# Patient Record
Sex: Female | Born: 1935 | Race: Black or African American | Hispanic: No | Smoking: Never smoker
Health system: Southern US, Community
[De-identification: ages and names within clinical notes are randomized; demographics above are authoritative.]

## PROBLEM LIST (undated history)

## (undated) DIAGNOSIS — R51 Headache: Secondary | ICD-10-CM

## (undated) DIAGNOSIS — M549 Dorsalgia, unspecified: Secondary | ICD-10-CM

## (undated) DIAGNOSIS — I639 Cerebral infarction, unspecified: Secondary | ICD-10-CM

## (undated) DIAGNOSIS — K922 Gastrointestinal hemorrhage, unspecified: Secondary | ICD-10-CM

## (undated) DIAGNOSIS — M1990 Unspecified osteoarthritis, unspecified site: Secondary | ICD-10-CM

## (undated) DIAGNOSIS — K5790 Diverticulosis of intestine, part unspecified, without perforation or abscess without bleeding: Secondary | ICD-10-CM

## (undated) DIAGNOSIS — R519 Headache, unspecified: Secondary | ICD-10-CM

## (undated) DIAGNOSIS — I10 Essential (primary) hypertension: Secondary | ICD-10-CM

## (undated) HISTORY — PX: ABDOMINAL HYSTERECTOMY: SHX81

## (undated) HISTORY — PX: CHOLECYSTECTOMY: SHX55

---

## 2001-07-03 ENCOUNTER — Emergency Department (HOSPITAL_COMMUNITY): Admission: EM | Admit: 2001-07-03 | Discharge: 2001-07-03 | Payer: Self-pay | Admitting: *Deleted

## 2001-08-28 ENCOUNTER — Emergency Department (HOSPITAL_COMMUNITY): Admission: EM | Admit: 2001-08-28 | Discharge: 2001-08-28 | Payer: Self-pay | Admitting: Emergency Medicine

## 2001-09-27 ENCOUNTER — Emergency Department (HOSPITAL_COMMUNITY): Admission: EM | Admit: 2001-09-27 | Discharge: 2001-09-27 | Payer: Self-pay | Admitting: Emergency Medicine

## 2001-09-27 ENCOUNTER — Encounter: Payer: Self-pay | Admitting: Emergency Medicine

## 2001-11-12 ENCOUNTER — Emergency Department (HOSPITAL_COMMUNITY): Admission: EM | Admit: 2001-11-12 | Discharge: 2001-11-12 | Payer: Self-pay | Admitting: *Deleted

## 2001-11-12 ENCOUNTER — Encounter: Payer: Self-pay | Admitting: *Deleted

## 2002-07-11 ENCOUNTER — Emergency Department (HOSPITAL_COMMUNITY): Admission: EM | Admit: 2002-07-11 | Discharge: 2002-07-11 | Payer: Self-pay | Admitting: Emergency Medicine

## 2002-07-11 ENCOUNTER — Encounter: Payer: Self-pay | Admitting: Emergency Medicine

## 2002-07-18 ENCOUNTER — Ambulatory Visit (HOSPITAL_COMMUNITY): Admission: RE | Admit: 2002-07-18 | Discharge: 2002-07-18 | Payer: Self-pay | Admitting: Emergency Medicine

## 2002-07-18 ENCOUNTER — Encounter: Payer: Self-pay | Admitting: Emergency Medicine

## 2002-11-13 ENCOUNTER — Emergency Department (HOSPITAL_COMMUNITY): Admission: EM | Admit: 2002-11-13 | Discharge: 2002-11-14 | Payer: Self-pay | Admitting: Emergency Medicine

## 2002-11-14 ENCOUNTER — Encounter: Payer: Self-pay | Admitting: Emergency Medicine

## 2003-01-19 ENCOUNTER — Emergency Department (HOSPITAL_COMMUNITY): Admission: EM | Admit: 2003-01-19 | Discharge: 2003-01-19 | Payer: Self-pay | Admitting: Emergency Medicine

## 2003-01-19 ENCOUNTER — Encounter: Payer: Self-pay | Admitting: Emergency Medicine

## 2003-06-04 ENCOUNTER — Emergency Department (HOSPITAL_COMMUNITY): Admission: EM | Admit: 2003-06-04 | Discharge: 2003-06-04 | Payer: Self-pay | Admitting: *Deleted

## 2006-09-16 ENCOUNTER — Emergency Department (HOSPITAL_COMMUNITY): Admission: EM | Admit: 2006-09-16 | Discharge: 2006-09-16 | Payer: Self-pay | Admitting: Emergency Medicine

## 2009-09-22 ENCOUNTER — Emergency Department (HOSPITAL_COMMUNITY): Admission: EM | Admit: 2009-09-22 | Discharge: 2009-09-22 | Payer: Self-pay | Admitting: Emergency Medicine

## 2010-07-14 ENCOUNTER — Emergency Department (HOSPITAL_COMMUNITY): Admission: EM | Admit: 2010-07-14 | Discharge: 2010-07-15 | Payer: Self-pay | Admitting: Emergency Medicine

## 2011-02-12 LAB — URINALYSIS, ROUTINE W REFLEX MICROSCOPIC
Glucose, UA: NEGATIVE mg/dL
Hgb urine dipstick: NEGATIVE
Ketones, ur: NEGATIVE mg/dL
Protein, ur: NEGATIVE mg/dL
Urobilinogen, UA: 0.2 mg/dL (ref 0.0–1.0)

## 2011-02-12 LAB — CBC
HCT: 37.3 % (ref 36.0–46.0)
Hemoglobin: 12.5 g/dL (ref 12.0–15.0)
MCHC: 33.4 g/dL (ref 30.0–36.0)
WBC: 7 10*3/uL (ref 4.0–10.5)

## 2011-02-12 LAB — COMPREHENSIVE METABOLIC PANEL
ALT: 14 U/L (ref 0–35)
Albumin: 3.7 g/dL (ref 3.5–5.2)
Alkaline Phosphatase: 37 U/L — ABNORMAL LOW (ref 39–117)
BUN: 11 mg/dL (ref 6–23)
Chloride: 98 mEq/L (ref 96–112)
Glucose, Bld: 125 mg/dL — ABNORMAL HIGH (ref 70–99)
Potassium: 2.9 mEq/L — ABNORMAL LOW (ref 3.5–5.1)
Sodium: 134 mEq/L — ABNORMAL LOW (ref 135–145)
Total Bilirubin: 0.8 mg/dL (ref 0.3–1.2)
Total Protein: 7.5 g/dL (ref 6.0–8.3)

## 2011-02-12 LAB — DIFFERENTIAL
Basophils Absolute: 0.1 10*3/uL (ref 0.0–0.1)
Basophils Relative: 1 % (ref 0–1)
Lymphocytes Relative: 27 % (ref 12–46)
Monocytes Absolute: 0.7 10*3/uL (ref 0.1–1.0)
Monocytes Relative: 9 % (ref 3–12)
Neutro Abs: 4.3 10*3/uL (ref 1.7–7.7)
Neutrophils Relative %: 61 % (ref 43–77)

## 2012-02-11 ENCOUNTER — Emergency Department (HOSPITAL_COMMUNITY)
Admission: EM | Admit: 2012-02-11 | Discharge: 2012-02-11 | Disposition: A | Discharge status: Home / Self Care | Payer: Medicare Other | Attending: Emergency Medicine | Admitting: Emergency Medicine

## 2012-02-11 ENCOUNTER — Encounter (HOSPITAL_COMMUNITY): Payer: Self-pay | Admitting: *Deleted

## 2012-02-11 ENCOUNTER — Emergency Department (HOSPITAL_COMMUNITY): Payer: Medicare Other

## 2012-02-11 DIAGNOSIS — M549 Dorsalgia, unspecified: Secondary | ICD-10-CM

## 2012-02-11 DIAGNOSIS — M545 Low back pain, unspecified: Secondary | ICD-10-CM | POA: Insufficient documentation

## 2012-02-11 DIAGNOSIS — R109 Unspecified abdominal pain: Secondary | ICD-10-CM | POA: Insufficient documentation

## 2012-02-11 DIAGNOSIS — M79609 Pain in unspecified limb: Secondary | ICD-10-CM | POA: Insufficient documentation

## 2012-02-11 HISTORY — DX: Unspecified osteoarthritis, unspecified site: M19.90

## 2012-02-11 HISTORY — DX: Essential (primary) hypertension: I10

## 2012-02-11 MED ORDER — OXYCODONE-ACETAMINOPHEN 5-325 MG PO TABS
1.0000 | ORAL_TABLET | Freq: Once | ORAL | Status: AC
  Administered 2012-02-11: 1 via ORAL
  Filled 2012-02-11: qty 1

## 2012-02-11 NOTE — ED Notes (Signed)
Pt alert & oriented x4, stable gait. Pt given discharge instructions, paperwork. Patient instructed to stop at the registration desk to finish any additional paperwork. pt verbalized understanding. Pt left department w/ no further questions.  

## 2012-02-11 NOTE — Discharge Instructions (Signed)
Take your ultram for pain.  Follow up with your md as needed

## 2012-02-11 NOTE — ED Notes (Signed)
Pain low back and both legs, "they hurt all the time ,but its getting worse".  No injury.  Alert,

## 2012-02-11 NOTE — ED Provider Notes (Signed)
History    This chart was scribed for Lynn Lennert, MD, MD by Smitty Pluck. The patient was seen in room APA03 and the patient's care was started at 5:10PM.   CSN: [id number]  Arrival date & time 02/11/12  1645   First MD Initiated Contact with Patient 02/11/12 1707      Chief Complaint  Patient presents with  . Back Pain    (Consider location/radiation/quality/duration/timing/severity/associated sxs/prior treatment) Patient is a 78 y.o. female presenting with back pain. The history is provided by the patient.  Back Pain  This is a recurrent problem. The problem occurs constantly. The problem has not changed since onset.The pain is associated with no known injury. The pain is present in the lumbar spine. The quality of the pain is described as aching. The pain does not radiate. The pain is moderate. The pain is the same all the time. Stiffness is present all day. She has tried nothing for the symptoms.   Lynn Nichols is a 78 y.o. female who presents to the Emergency Department complaining of moderate lower back and leg pain. Pt also reports mild abdominal pain. She denies vomiting, fever, cough and dysuria. She has had more frequent urination. She reports that she has arthritis. She reports that she has had this pain before awhile ago. The pain has been constant since onset without radiation. Pt denies injury.   Past Medical History  Diagnosis Date  . Arthritis   . Hypertension     Past Surgical History  Procedure Date  . Cholecystectomy   . Abdominal hysterectomy     History reviewed. No pertinent family history.  History  Substance Use Topics  . Smoking status: Never Smoker   . Smokeless tobacco: Current User    Types: Snuff  . Alcohol Use: No    OB History    Grav Para Term Preterm Abortions TAB SAB Ect Mult Living                  Review of Systems  Musculoskeletal: Positive for back pain.  All other systems reviewed and are negative.  10 Systems  reviewed and are negative for acute change except as noted in the HPI.   Allergies  Penicillins  Home Medications  No current outpatient prescriptions on file.  BP 151/60  Pulse 60  Temp(Src) 97.9 F (36.6 C) (Oral)  Resp 17  Ht 5\' 5"  (1.651 m)  Wt 143 lb (64.864 kg)  BMI 23.80 kg/m2  SpO2 96%  Physical Exam  Nursing note and vitals reviewed. Constitutional: She is oriented to person, place, and time. She appears well-developed.  HENT:  Head: Normocephalic and atraumatic.  Eyes: Conjunctivae and EOM are normal. No scleral icterus.  Neck: Neck supple. No thyromegaly present.  Cardiovascular: Normal rate and regular rhythm.  Exam reveals no gallop and no friction rub.   No murmur heard. Pulmonary/Chest: No stridor. She has no wheezes. She has no rales. She exhibits no tenderness.  Abdominal: She exhibits no distension. There is no tenderness. There is no rebound.  Musculoskeletal: Normal range of motion. She exhibits tenderness (lumbar spine). She exhibits no edema.  Lymphadenopathy:    She has no cervical adenopathy.  Neurological: She is oriented to person, place, and time. Coordination normal.  Skin: No rash noted. No erythema.  Psychiatric: She has a normal mood and affect. Her behavior is normal.    ED Course  Procedures (including critical care time)  DIAGNOSTIC STUDIES: Oxygen Saturation is 96% on  room air, normal by my interpretation.    COORDINATION OF CARE: 5:15PM EDP discusses pt ED treatment course with pt and pt's family    Labs Reviewed - No data to display Dg Lumbar Spine Complete  02/11/2012  *RADIOLOGY REPORT*  Clinical Data: Lower back pain.  No injury.  LUMBAR SPINE - COMPLETE 4+ VIEW  Comparison: None.  Findings: Rightward scoliosis and severe diffuse degenerative disc disease and facet disease throughout the lumbar spine.  7 mm of anterolisthesis of L4 on L5 related to severe facet disease.  No fracture.  No acute subluxation.  SI joints are  symmetric and unremarkable.  Aorta is calcified but appears nonaneurysmal.  IMPRESSION: Rightward scoliosis.  Severe spondylosis.  No acute findings.  Original Report Authenticated By: Cyndie Chime, M.D.   Dg Pelvis 1-2 Views  02/11/2012  *RADIOLOGY REPORT*  Clinical Data: Pelvic pain.  PELVIS - 1-2 VIEW  Comparison: 07/15/2010  Findings: No evidence of pelvic fracture.  Mild degenerative changes are seen involving the pubic symphysis.  No focal lytic or sclerotic bone lesions are seen involve the pelvis.  Advanced degenerative changes are seen in the lower lumbar spine.  IMPRESSION:  1.  No acute findings. 2.  Mild pubic symphysis degenerative changes. 3.  Advanced lower lumbar spondylosis.  Original Report Authenticated By: Danae Orleans, M.D.     No diagnosis found. The pt has not been taking her ultram.  She improved with percocet  MDM     Lumbar pain form arthitis    Lynn Lennert, MD 02/11/12 1845

## 2012-02-22 ENCOUNTER — Emergency Department (HOSPITAL_COMMUNITY)
Admission: EM | Admit: 2012-02-22 | Discharge: 2012-02-22 | Disposition: A | Discharge status: Home / Self Care | Payer: Medicare Other | Attending: Emergency Medicine | Admitting: Emergency Medicine

## 2012-02-22 ENCOUNTER — Emergency Department (HOSPITAL_COMMUNITY): Payer: Medicare Other

## 2012-02-22 ENCOUNTER — Encounter (HOSPITAL_COMMUNITY): Payer: Self-pay | Admitting: *Deleted

## 2012-02-22 DIAGNOSIS — M549 Dorsalgia, unspecified: Secondary | ICD-10-CM

## 2012-02-22 DIAGNOSIS — M129 Arthropathy, unspecified: Secondary | ICD-10-CM | POA: Insufficient documentation

## 2012-02-22 DIAGNOSIS — G8929 Other chronic pain: Secondary | ICD-10-CM | POA: Insufficient documentation

## 2012-02-22 DIAGNOSIS — M25551 Pain in right hip: Secondary | ICD-10-CM

## 2012-02-22 DIAGNOSIS — Z79899 Other long term (current) drug therapy: Secondary | ICD-10-CM | POA: Insufficient documentation

## 2012-02-22 DIAGNOSIS — M25559 Pain in unspecified hip: Secondary | ICD-10-CM | POA: Insufficient documentation

## 2012-02-22 DIAGNOSIS — M545 Low back pain, unspecified: Secondary | ICD-10-CM | POA: Insufficient documentation

## 2012-02-22 DIAGNOSIS — I10 Essential (primary) hypertension: Secondary | ICD-10-CM | POA: Insufficient documentation

## 2012-02-22 DIAGNOSIS — R339 Retention of urine, unspecified: Secondary | ICD-10-CM | POA: Insufficient documentation

## 2012-02-22 DIAGNOSIS — M533 Sacrococcygeal disorders, not elsewhere classified: Secondary | ICD-10-CM | POA: Insufficient documentation

## 2012-02-22 HISTORY — DX: Dorsalgia, unspecified: M54.9

## 2012-02-22 LAB — URINALYSIS, ROUTINE W REFLEX MICROSCOPIC
Bilirubin Urine: NEGATIVE
Glucose, UA: NEGATIVE mg/dL
Hgb urine dipstick: NEGATIVE
Ketones, ur: 15 mg/dL — AB
Protein, ur: 100 mg/dL — AB
pH: 7 (ref 5.0–8.0)

## 2012-02-22 LAB — URINE MICROSCOPIC-ADD ON

## 2012-02-22 MED ORDER — HYDROCODONE-ACETAMINOPHEN 5-325 MG PO TABS
ORAL_TABLET | ORAL | Status: AC
  Administered 2012-02-22: 1
  Filled 2012-02-22: qty 1

## 2012-02-22 MED ORDER — TRAMADOL HCL 50 MG PO TABS: 50.0000 mg | ORAL_TABLET | Freq: Three times a day (TID) | ORAL | Status: DC | PRN

## 2012-02-22 NOTE — ED Notes (Addendum)
Pt brought to er by family due to right hip pain, pt recently seen in er for same complaint, advised to follow up with pcp. Pt and family states that pt is not getting any better. Cms intact distal to right hip. Pt also reports that she has been having problems with urinating since having problems with her hip

## 2012-02-22 NOTE — ED Notes (Signed)
Explained procedure again and told her family can go with her. Pt agreed to have ct done. edp aware.

## 2012-02-22 NOTE — ED Provider Notes (Signed)
History    This chart was scribed for Laray Anger, DO, MD by Smitty Pluck. The patient was seen in room APA05 and the patient's care was started at 12:26PM.   CSN: [id number]  Arrival date & time 02/22/12  1115   First MD Initiated Contact with Patient 02/22/12 1259      Chief Complaint  Patient presents with  . Hip Pain  . Urinary Retention    HPI Lynn Nichols is a 78 y.o. female who presents to the Emergency Department complaining of gradual onset and persistence of constant right hip "pain" onset 2 weeks ago. Pt reports having pain in her right side low back that started before the hip pain. Pain began after she "lifting something." Symptoms have been constant since onset without radiation.  Family states pt "might" be having "decreased" urination. Denies dysuria/hematuria, no abd pain, no N/V/D, no tingling/numbness in extremities, no focal motor weakness, no saddle anesthesia, no incont of bowel or bladder, no falls/injury.    Past Medical History  Diagnosis Date  . Arthritis   . Hypertension   . Back pain     Past Surgical History  Procedure Date  . Cholecystectomy   . Abdominal hysterectomy     History  Substance Use Topics  . Smoking status: Never Smoker   . Smokeless tobacco: Current User    Types: Snuff  . Alcohol Use: No   Review of Systems ROS: Statement: All systems negative except as marked or noted in the HPI; Constitutional: Negative for fever and chills. ; ; Eyes: Negative for eye pain, redness and discharge. ; ; ENMT: Negative for ear pain, hoarseness, nasal congestion, sinus pressure and sore throat. ; ; Cardiovascular: Negative for chest pain, palpitations, diaphoresis, dyspnea and peripheral edema. ; ; Respiratory: Negative for cough, wheezing and stridor. ; ; Gastrointestinal: Negative for nausea, vomiting, diarrhea, abdominal pain, blood in stool, hematemesis, jaundice and rectal bleeding. . ; ; Genitourinary: Negative for dysuria, flank pain  and hematuria. ; ; Musculoskeletal: +back pain, right hip pain.  Denies neck pain. Negative for swelling and trauma.; ; Skin: Negative for pruritus, rash, abrasions, blisters, bruising and skin lesion.; ; Neuro: Negative for headache, lightheadedness and neck stiffness. Negative for weakness, altered level of consciousness , altered mental status, extremity weakness, paresthesias, involuntary movement, seizure and syncope.        Allergies  Penicillins  Home Medications   Current Outpatient Rx  Name Route Sig Dispense Refill  . ACETAMINOPHEN 500 MG PO TABS Oral Take 500 mg by mouth every 6 (six) hours as needed. For pain    . ALBUTEROL SULFATE HFA 108 (90 BASE) MCG/ACT IN AERS Inhalation Inhale 2 puffs into the lungs every 6 (six) hours as needed. For shortness of breath    . ATENOLOL 50 MG PO TABS Oral Take 50 mg by mouth every morning.    Marland Kitchen HYDROCHLOROTHIAZIDE 25 MG PO TABS Oral Take 25 mg by mouth every morning.    Marland Kitchen LEVOBUNOLOL HCL 0.5 % OP SOLN Both Eyes Place 1 drop into both eyes 2 (two) times daily.    . MELOXICAM 7.5 MG PO TABS Oral Take 7.5-15 mg by mouth daily.    Marland Kitchen NIFEDIPINE ER OSMOTIC 90 MG PO TB24 Oral Take 90 mg by mouth every morning.    Marland Kitchen TRAMADOL HCL 50 MG PO TABS Oral Take 1 tablet (50 mg total) by mouth every 8 (eight) hours as needed for pain. 9 tablet 0    BP  143/54  Pulse 51  Temp(Src) 98.1 F (36.7 C) (Oral)  Resp 16  Ht 5\' 5"  (1.651 m)  SpO2 97%  Physical Exam 1330: Physical examination:  Nursing notes reviewed; Vital signs and O2 SAT reviewed;  Constitutional: Well developed, Well nourished, Well hydrated, In no acute distress; Head:  Normocephalic, atraumatic; Eyes: EOMI, PERRL, No scleral icterus; ENMT: Mouth and pharynx normal, Mucous membranes moist; Neck: Supple, Full range of motion, No lymphadenopathy; Cardiovascular: Regular rate and rhythm, No murmur, rub, or gallop; Respiratory: Breath sounds clear & equal bilaterally, No rales, rhonchi, wheezes,  or rub, Normal respiratory effort/excursion; Chest: Nontender, Movement normal; Abdomen: Soft, Nontender, Nondistended, Normal bowel sounds; Genitourinary: No CVA tenderness; Spine:  No midline CS, TS, LS tenderness, +TTP right lower lumbar paraspinal muscles and SI joint; Extremities: Pulses normal, pelvis stable, +mild right hip TTP. No palpable right inguinal masses.  No deformity, No edema, No calf edema or asymmetry.; Neuro: AA&Ox3, Major CN grossly intact.  No gross focal motor or sensory deficits in extremities.; Skin: Color normal, Warm, Dry, no rash.    ED Course  Procedures   MDM  MDM Reviewed: nursing note, vitals and previous chart Reviewed previous: x-ray Interpretation: CT scan and labs   Results for orders placed during the hospital encounter of 02/22/12  URINALYSIS, ROUTINE W REFLEX MICROSCOPIC      Component Value Range   Color, Urine YELLOW  YELLOW    APPearance CLEAR  CLEAR    Specific Gravity, Urine 1.015  1.005 - 1.030    pH 7.0  5.0 - 8.0    Glucose, UA NEGATIVE  NEGATIVE (mg/dL)   Hgb urine dipstick NEGATIVE  NEGATIVE    Bilirubin Urine NEGATIVE  NEGATIVE    Ketones, ur 15 (*) NEGATIVE (mg/dL)   Protein, ur 960 (*) NEGATIVE (mg/dL)   Urobilinogen, UA 2.0 (*) 0.0 - 1.0 (mg/dL)   Nitrite NEGATIVE  NEGATIVE    Leukocytes, UA NEGATIVE  NEGATIVE   URINE MICROSCOPIC-ADD ON      Component Value Range   Squamous Epithelial / LPF MANY (*) RARE    WBC, UA 0-2  <3 (WBC/hpf)   RBC / HPF 3-6  <3 (RBC/hpf)   Bacteria, UA RARE  RARE    Ct Abdomen Pelvis Wo Contrast 02/22/2012  *RADIOLOGY REPORT*  Clinical Data: Hip pain.  Urinary retention  CT ABDOMEN AND PELVIS WITHOUT CONTRAST  Technique:  Multidetector CT imaging of the abdomen and pelvis was performed following the standard protocol without intravenous contrast.  Comparison: None  Findings:  No pericardial or pleural effusion.  Lung bases appear clear.  The spleen appears normal.  The adrenal glands are both normal.   The pancreas appears normal.  Normal appearance of both adrenal glands.  The liver parenchyma appears within normal limits.  The gallbladder is not visualized compatible with prior cholecystectomy.  No significant biliary dilatation.  The pancreas appears normal.  Bilateral renal cysts are identified.  These are incompletely characterized without IV contrast.  Tiny stone within the inferior pole of the right kidney measures 2- 3 mm, image 34.  There is no right hydronephrosis or, hydroureter. No right-sided ureterolithiasis.  There is no left renal calculus. No left-sided hydroureter or ureterolithiasis.  There is calcified atherosclerotic disease affecting the abdominal aorta and its branches.  Urinary bladder appears normal.  The stomach and the small bowel loops are unremarkable.  Multiple left-sided diverticula noted without acute inflammation.  Review of the visualized osseous structures is significant for scoliosis  and spondylosis.  Most severe at the L5-S1 level.  There is a first-degree anterolisthesis of L4 on L5 noted.  IMPRESSION:  1.  No acute findings within the abdomen or pelvis. 2.  Punctate nonobstructing right renal calculus. 3.  Bilateral renal cysts are incompletely characterized without IV contrast.  Original Report Authenticated By: Rosealee Albee, M.D.   Dg Lumbar Spine Complete 02/11/2012  *RADIOLOGY REPORT*  Clinical Data: Lower back pain.  No injury.  LUMBAR SPINE - COMPLETE 4+ VIEW  Comparison: None.  Findings: Rightward scoliosis and severe diffuse degenerative disc disease and facet disease throughout the lumbar spine.  7 mm of anterolisthesis of L4 on L5 related to severe facet disease.  No fracture.  No acute subluxation.  SI joints are symmetric and unremarkable.  Aorta is calcified but appears nonaneurysmal.  IMPRESSION: Rightward scoliosis.  Severe spondylosis.  No acute findings.  Original Report Authenticated By: Cyndie Chime, M.D.   Dg Pelvis 1-2 Views 02/11/2012   *RADIOLOGY REPORT*  Clinical Data: Pelvic pain.  PELVIS - 1-2 VIEW  Comparison: 07/15/2010  Findings: No evidence of pelvic fracture.  Mild degenerative changes are seen involving the pubic symphysis.  No focal lytic or sclerotic bone lesions are seen involve the pelvis.  Advanced degenerative changes are seen in the lower lumbar spine.  IMPRESSION:  1.  No acute findings. 2.  Mild pubic symphysis degenerative changes. 3.  Advanced lower lumbar spondylosis.  Original Report Authenticated By: Danae Orleans, M.D.    5:25 PM:  Pt is not incont of urine or bowel while in ED; family denies she is at home also.  UA without infection.  Pt wants to go home now, has a cane she walks with and she lives with family.  Family is requesting "something stronger than the tylenol."  I explained my concern of pt becoming groggy/confused on a narcotic pain medicine, but family states pt "really needs something stronger to sleep."  VS remain stable while in ED, neuro exam unchanged.  Family wants to take pt home now.  Dx testing d/w pt and family.  Questions answered.  Verb understanding, agreeable to d/c home with outpt f/u.      I personally performed the services described in this documentation, which was scribed in my presence. The recorded information has been reviewed and considered. Sherryann Frese Allison Quarry, DO 02/24/12 2009

## 2012-02-22 NOTE — Discharge Instructions (Signed)
RESOURCE GUIDE  Dental Problems  Patients with Medicaid: [facility]                     [facility] [address]                                           [address] Phone:  [phone]                                                  Phone:  [phone]  If unable to pay or uninsured, contact:  [facility] or [facility] to become qualified for the adult dental clinic.  Chronic Pain Problems Contact [facility]  [phone] Patients need to be referred by their primary care doctor.  Insufficient Money for Medicine Contact [employer]:  call "211" or [facility] 271-5999.  No Primary Care Doctor Call [facility]  832-8000 Other agencies that provide inexpensive medical care    [facility]  [phone]    [facility]  [phone]    [facility]  [phone]    Women's Clinic  [phone]    [facility]  [phone]    [facility]  [phone]  Psychological Services [facility]  [phone] [facility]  [phone] [facility]   800 853-5163 (emergency services 641-4993)  Substance Abuse Resources [facility]  336-882-2125 [facility] 336-784-9470 [facility] 336-285-9073 [facility] 336-845-3988 Residential & Outpatient Substance Abuse Program  800-659-3381  Abuse/Neglect [place] Child Abuse Hotline (336) 641-3795 [place] Child Abuse Hotline 800-378-5315 (After Hours)  Emergency Shelter [facility] (336) 271-5985  Maternity Homes [facility] (336) 275-9566 [facility] (704) 372-4663  MRSA Hotline #:   832-7006    [place] Resources  [facility] [place]     [employer]                          [facility] [address] [place]                       [address]      [address]  [place]                                                [place]                            [place] Phone:  [phone]                                   Phone:  [phone]                 Phone:  [phone]  [facility] Phone:  [phone]    [facility] Child Abuse Hotline 802-192-2926 667-218-3937 (After Hours)    Take the prescription as directed.  Apply moist heat or ice to the area(s) of discomfort, for 15 minutes at a time, several times per day for the next few days.  Do not fall asleep on a heating or ice pack.  Call your regular medical doctor tomorrow to schedule a follow up appointment this week.  Return to the Emergency Department immediately if worsening.

## 2012-02-23 LAB — URINE CULTURE
Colony Count: NO GROWTH
Culture  Setup Time: 201303252210
Culture: NO GROWTH

## 2012-03-01 ENCOUNTER — Encounter (HOSPITAL_COMMUNITY): Payer: Self-pay | Admitting: *Deleted

## 2012-03-01 ENCOUNTER — Other Ambulatory Visit: Payer: Self-pay

## 2012-03-01 ENCOUNTER — Emergency Department (HOSPITAL_COMMUNITY): Payer: Medicare Other

## 2012-03-01 ENCOUNTER — Emergency Department (HOSPITAL_COMMUNITY)
Admission: EM | Admit: 2012-03-01 | Discharge: 2012-03-01 | Disposition: A | Discharge status: Home / Self Care | Payer: Medicare Other | Attending: Emergency Medicine | Admitting: Emergency Medicine

## 2012-03-01 DIAGNOSIS — R110 Nausea: Secondary | ICD-10-CM | POA: Insufficient documentation

## 2012-03-01 DIAGNOSIS — M1610 Unilateral primary osteoarthritis, unspecified hip: Secondary | ICD-10-CM | POA: Insufficient documentation

## 2012-03-01 DIAGNOSIS — R109 Unspecified abdominal pain: Secondary | ICD-10-CM | POA: Insufficient documentation

## 2012-03-01 DIAGNOSIS — M169 Osteoarthritis of hip, unspecified: Secondary | ICD-10-CM | POA: Insufficient documentation

## 2012-03-01 LAB — CBC
HCT: 41.3 % (ref 36.0–46.0)
MCH: 29.9 pg (ref 26.0–34.0)
MCHC: 35.4 g/dL (ref 30.0–36.0)
MCV: 84.5 fL (ref 78.0–100.0)
RDW: 13.6 % (ref 11.5–15.5)
WBC: 8.9 10*3/uL (ref 4.0–10.5)

## 2012-03-01 LAB — DIFFERENTIAL
Basophils Absolute: 0 10*3/uL (ref 0.0–0.1)
Eosinophils Absolute: 0 10*3/uL (ref 0.0–0.7)
Eosinophils Relative: 0 % (ref 0–5)
Lymphocytes Relative: 21 % (ref 12–46)
Monocytes Absolute: 0.6 10*3/uL (ref 0.1–1.0)

## 2012-03-01 LAB — BASIC METABOLIC PANEL
CO2: 28 mEq/L (ref 19–32)
Calcium: 9.5 mg/dL (ref 8.4–10.5)
Creatinine, Ser: 0.63 mg/dL (ref 0.50–1.10)
GFR calc non Af Amer: 74 mL/min — ABNORMAL LOW (ref >90)
Glucose, Bld: 130 mg/dL — ABNORMAL HIGH (ref 70–99)

## 2012-03-01 MED ORDER — POLYETHYLENE GLYCOL 3350 17 G PO PACK: 17.0000 g | PACK | Freq: Every day | ORAL | Status: AC

## 2012-03-01 MED ORDER — IBUPROFEN 600 MG PO TABS: 600.0000 mg | ORAL_TABLET | Freq: Four times a day (QID) | ORAL | Status: AC | PRN

## 2012-03-01 NOTE — Discharge Instructions (Signed)
Degenerative Arthritis  You have osteoarthritis. This is the wear and tear arthritis that comes with aging. It is also called degenerative arthritis. This is common in people past middle age. It is caused by stress on the joints. The large weight bearing joints of the lower extremities are most often affected. The knees, hips, back, neck, and hands can become painful, swollen, and stiff. This is the most common type of arthritis. It comes on with age, carrying too much weight, or from an injury.  Treatment includes resting the sore joint until the pain and swelling improve. Crutches or a walker may be needed for severe flares. Only take over-the-counter or prescription medicines for pain, discomfort, or fever as directed by your caregiver. Local heat therapy may improve motion. Cortisone shots into the joint are sometimes used to reduce pain and swelling during flares.  Osteoarthritis is usually not crippling and progresses slowly. There are things you can do to decrease pain:  · Avoid high impact activities.  · Exercise regularly.  · Low impact exercises such as walking, biking and swimming help to keep the muscles strong and keep normal joint function.  · Stretching helps to keep your range of motion.  · Lose weight if you are overweight. This reduces joint stress.  In severe cases when you have pain at rest or increasing disability, joint surgery may be helpful. See your caregiver for follow-up treatment as recommended.   SEEK IMMEDIATE MEDICAL CARE IF:   · You have severe joint pain.  · Marked swelling and redness in your joint develops.  · You develop a high fever.  Document Released: 11/16/2005 Document Revised: 11/05/2011 Document Reviewed: 04/18/2007  [employer]® Patient Information ©2012 [employer].

## 2012-03-01 NOTE — ED Notes (Signed)
Pt reports generalized pain to arms, legs, and abdomen x 2 weeks. When asked what hurts the most pt points to right lower quadrant. Reports nausea no vomiting. Reports mild constipation. Pt denies specific chest pain, ekg completed however due to abd discomfort and generalized complaints.

## 2012-03-01 NOTE — ED Provider Notes (Addendum)
History     CSN: [id number]  Arrival date & time 03/01/12  1434   First MD Initiated Contact with Patient 03/01/12 1521      Chief Complaint  Patient presents with  . Abdominal Pain  . Nausea     HPI Pt reports generalized pain to arms, legs, and abdomen x 2 weeks. When asked what hurts the most pt points to right lower quadrant. Reports nausea no vomiting. Reports mild constipation. Pt denies specific chest pain, ekg completed however due to abd discomfort and generalized complaints.   Past Medical History  Diagnosis Date  . Arthritis   . Hypertension   . Back pain     Past Surgical History  Procedure Date  . Cholecystectomy   . Abdominal hysterectomy     History reviewed. No pertinent family history.  History  Substance Use Topics  . Smoking status: Never Smoker   . Smokeless tobacco: Current User    Types: Snuff  . Alcohol Use: No    OB History    Grav Para Term Preterm Abortions TAB SAB Ect Mult Living                  Review of Systems  Unable to perform ROS   Allergies  Penicillins  Home Medications   Current Outpatient Rx  Name Route Sig Dispense Refill  . ACETAMINOPHEN 500 MG PO TABS Oral Take 500 mg by mouth every 6 (six) hours as needed. For pain    . ALBUTEROL SULFATE HFA 108 (90 BASE) MCG/ACT IN AERS Inhalation Inhale 2 puffs into the lungs every 6 (six) hours as needed. For shortness of breath    . ATENOLOL 50 MG PO TABS Oral Take 50 mg by mouth every morning.    Marland Kitchen HYDROCHLOROTHIAZIDE 25 MG PO TABS Oral Take 25 mg by mouth every morning.    Marland Kitchen LEVOBUNOLOL HCL 0.5 % OP SOLN Both Eyes Place 1 drop into both eyes 2 (two) times daily.    Marland Kitchen NIFEDIPINE ER OSMOTIC 90 MG PO TB24 Oral Take 90 mg by mouth every morning.    Marland Kitchen TRAMADOL HCL 50 MG PO TABS Oral Take 50 mg by mouth every 8 (eight) hours as needed. For pain    . IBUPROFEN 600 MG PO TABS Oral Take 1 tablet (600 mg total) by mouth every 6 (six) hours as needed for pain. 30 tablet 0    BP  139/74  Pulse 54  Temp(Src) 97 F (36.1 C) (Oral)  Resp 16  SpO2 97%  Physical Exam  Nursing note and vitals reviewed. Constitutional: She is oriented to person, place, and time. She appears well-developed and well-nourished. No distress.  HENT:  Head: Normocephalic and atraumatic.  Eyes: Pupils are equal, round, and reactive to light.  Neck: Normal range of motion.  Cardiovascular: Normal rate and intact distal pulses.   Pulmonary/Chest: No respiratory distress.  Abdominal: Normal appearance. She exhibits no distension.       Examination of inguinal canals reveals no hernia examination of umbilicus abdominal wall reveals no hernia  Musculoskeletal: Normal range of motion.       Right hip: She exhibits tenderness.       Legs: Neurological: She is alert and oriented to person, place, and time. No cranial nerve deficit.  Skin: Skin is warm and dry. No rash noted.  Psychiatric: She has a normal mood and affect. Her behavior is normal.    ED Course  Procedures (including critical care time)  Labs  Reviewed  BASIC METABOLIC PANEL - Abnormal; Notable for the following:    Sodium 134 (*)    Potassium 3.2 (*)    Chloride 92 (*)    Glucose, Bld 130 (*)    GFR calc non Af Amer 74 (*)    GFR calc Af Amer 86 (*)    All other components within normal limits  CBC  DIFFERENTIAL   Dg Abd Acute W/chest  03/01/2012  *RADIOLOGY REPORT*  Clinical Data: Nausea, decreased bowel movements  ACUTE ABDOMEN SERIES (ABDOMEN 2 VIEW & CHEST 1 VIEW)  Comparison: CT scan 02/22/2012  Findings: Cardiomediastinal silhouette is unremarkable.  No acute infiltrate or pleural effusion.  No pulmonary edema.  Mild elevation of the left hemidiaphragm.  Mild levoscoliosis lumbar spine.  There is nonspecific nonobstructive bowel gas pattern. Moderate stool noted in the right colon.  Pelvic phleboliths are noted.  Degenerative changes lumbar spine.  No free abdominal air.  IMPRESSION: No acute disease.  Mild elevation of  the left hemidiaphragm. Degenerative changes and mild levoscoliosis lumbar spine. Nonspecific nonobstructive bowel gas pattern.  Moderate colonic stool right colon.  Original Report Authenticated By: Natasha Mead, M.D.     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

## 2012-11-08 ENCOUNTER — Emergency Department (HOSPITAL_COMMUNITY): Payer: Medicare Other

## 2012-11-08 ENCOUNTER — Encounter (HOSPITAL_COMMUNITY): Payer: Self-pay | Admitting: *Deleted

## 2012-11-08 ENCOUNTER — Emergency Department (HOSPITAL_COMMUNITY)
Admission: EM | Admit: 2012-11-08 | Discharge: 2012-11-08 | Disposition: A | Discharge status: Home / Self Care | Payer: Medicare Other | Attending: Emergency Medicine | Admitting: Emergency Medicine

## 2012-11-08 DIAGNOSIS — M545 Low back pain, unspecified: Secondary | ICD-10-CM | POA: Insufficient documentation

## 2012-11-08 DIAGNOSIS — E876 Hypokalemia: Secondary | ICD-10-CM

## 2012-11-08 DIAGNOSIS — Z79899 Other long term (current) drug therapy: Secondary | ICD-10-CM | POA: Insufficient documentation

## 2012-11-08 DIAGNOSIS — M549 Dorsalgia, unspecified: Secondary | ICD-10-CM

## 2012-11-08 DIAGNOSIS — Z9089 Acquired absence of other organs: Secondary | ICD-10-CM | POA: Insufficient documentation

## 2012-11-08 DIAGNOSIS — I10 Essential (primary) hypertension: Secondary | ICD-10-CM | POA: Insufficient documentation

## 2012-11-08 DIAGNOSIS — Z90710 Acquired absence of both cervix and uterus: Secondary | ICD-10-CM | POA: Insufficient documentation

## 2012-11-08 DIAGNOSIS — R109 Unspecified abdominal pain: Secondary | ICD-10-CM | POA: Insufficient documentation

## 2012-11-08 LAB — LIPASE, BLOOD: Lipase: 12 U/L (ref 11–59)

## 2012-11-08 LAB — COMPREHENSIVE METABOLIC PANEL
AST: 26 U/L (ref 0–37)
CO2: 31 mEq/L (ref 19–32)
Calcium: 10.3 mg/dL (ref 8.4–10.5)
Creatinine, Ser: 0.83 mg/dL (ref 0.50–1.10)
GFR calc Af Amer: 67 mL/min — ABNORMAL LOW (ref >90)
GFR calc non Af Amer: 58 mL/min — ABNORMAL LOW (ref >90)
Glucose, Bld: 140 mg/dL — ABNORMAL HIGH (ref 70–99)

## 2012-11-08 LAB — CBC WITH DIFFERENTIAL/PLATELET
Basophils Absolute: 0 10*3/uL (ref 0.0–0.1)
Eosinophils Relative: 1 % (ref 0–5)
HCT: 41.9 % (ref 36.0–46.0)
Lymphocytes Relative: 26 % (ref 12–46)
MCV: 86.7 fL (ref 78.0–100.0)
Monocytes Absolute: 0.5 10*3/uL (ref 0.1–1.0)
RDW: 13.9 % (ref 11.5–15.5)
WBC: 7.2 10*3/uL (ref 4.0–10.5)

## 2012-11-08 LAB — URINALYSIS, ROUTINE W REFLEX MICROSCOPIC
Leukocytes, UA: NEGATIVE
Nitrite: NEGATIVE
Urobilinogen, UA: 1 mg/dL (ref 0.0–1.0)

## 2012-11-08 LAB — URINE MICROSCOPIC-ADD ON

## 2012-11-08 MED ORDER — OXYCODONE-ACETAMINOPHEN 5-325 MG PO TABS: 1.0000 | ORAL_TABLET | Freq: Three times a day (TID) | ORAL | Status: DC | PRN

## 2012-11-08 MED ORDER — OXYCODONE-ACETAMINOPHEN 5-325 MG PO TABS
1.0000 | ORAL_TABLET | Freq: Once | ORAL | Status: AC
  Administered 2012-11-08: 1 via ORAL
  Filled 2012-11-08: qty 1

## 2012-11-08 MED ORDER — POTASSIUM CHLORIDE CRYS ER 20 MEQ PO TBCR
40.0000 meq | EXTENDED_RELEASE_TABLET | Freq: Once | ORAL | Status: AC
  Administered 2012-11-08: 40 meq via ORAL
  Filled 2012-11-08: qty 2

## 2012-11-08 NOTE — ED Provider Notes (Signed)
History     CSN: [id number]  Arrival date & time 11/08/12  1501   First MD Initiated Contact with Patient 11/08/12 1625      Chief Complaint  Patient presents with  . Back Pain  . Abdominal Pain     Patient is a 78 y.o. female presenting with back pain. The history is provided by the patient and a relative.  Back Pain  This is a recurrent problem. The current episode started more than 1 week ago. The problem occurs constantly. The problem has been gradually worsening. The pain is associated with no known injury. The pain is present in the lumbar spine. The pain does not radiate. The pain is moderate. The symptoms are aggravated by certain positions. Associated symptoms include abdominal pain and pelvic pain. Pertinent negatives include no chest pain, no fever, no bowel incontinence, no dysuria, no paresis and no weakness. Treatments tried: rest. The treatment provided mild relief.  pt reports low back pain and lower abd/pelvic pain for "awhile" No falls/trauma She is ambulatory No fever No weakness No dysuria No vomiting/diarrhea   Past Medical History  Diagnosis Date  . Arthritis   . Hypertension   . Back pain     Past Surgical History  Procedure Date  . Cholecystectomy   . Abdominal hysterectomy     No family history on file.  History  Substance Use Topics  . Smoking status: Never Smoker   . Smokeless tobacco: Current User    Types: Snuff  . Alcohol Use: No    OB History    Grav Para Term Preterm Abortions TAB SAB Ect Mult Living                  Review of Systems  Constitutional: Negative for fever.  Cardiovascular: Negative for chest pain.  Gastrointestinal: Positive for abdominal pain. Negative for vomiting, blood in stool and bowel incontinence.  Genitourinary: Positive for pelvic pain. Negative for dysuria.  Musculoskeletal: Positive for back pain.  Skin: Negative for color change.  Neurological: Negative for weakness.  Psychiatric/Behavioral:  Negative for agitation.  All other systems reviewed and are negative.    Allergies  Penicillins  Home Medications   Current Outpatient Rx  Name  Route  Sig  Dispense  Refill  . ACETAMINOPHEN 500 MG PO TABS   Oral   Take 500 mg by mouth every 6 (six) hours as needed. For pain         . ALBUTEROL SULFATE HFA 108 (90 BASE) MCG/ACT IN AERS   Inhalation   Inhale 2 puffs into the lungs every 6 (six) hours as needed. For shortness of breath         . ATENOLOL 50 MG PO TABS   Oral   Take 50 mg by mouth every morning.         Marland Kitchen HYDROCHLOROTHIAZIDE 25 MG PO TABS   Oral   Take 25 mg by mouth every morning.         Marland Kitchen LEVOBUNOLOL HCL 0.5 % OP SOLN   Both Eyes   Place 1 drop into both eyes 2 (two) times daily.         Marland Kitchen NIFEDIPINE ER OSMOTIC 90 MG PO TB24   Oral   Take 90 mg by mouth every morning.           BP 162/50  Pulse 54  Temp 98.4 F (36.9 C) (Oral)  Resp 20  Ht 5\' 5"  (1.651 m)  Wt 135  lb (61.236 kg)  BMI 22.47 kg/m2  SpO2 98%  Physical Exam CONSTITUTIONAL: Well developed/well nourished, appears younger than stated age HEAD AND FACE: Normocephalic/atraumatic EYES: EOMI/PERRL ENMT: Mucous membranes moist NECK: supple no meningeal signs SPINE:entire spine nontender, mild lumbar paraspinal tenderness CV: S1/S2 noted, no murmurs/rubs/gallops noted LUNGS: Lungs are clear to auscultation bilaterally, no apparent distress ABDOMEN: soft, nontender, no rebound or guarding GU:no cva tenderness.   NEURO: Pt is awake/alert, moves all extremitiesx4, no focal motor deficits in her lower extremities EXTREMITIES: pulses normal, full ROM, no deformity.  She has tenderness on medial aspect of right thigh/proximal aspect and tenderness with ROM.  No erythema/warmth/induration noted.  Pelvis is stable No inguinal lymphadenopathy.   SKIN: warm, color normal PSYCH: no abnormalities of mood noted  ED Course  Procedures    Labs Reviewed  CBC WITH DIFFERENTIAL   COMPREHENSIVE METABOLIC PANEL  LIPASE, BLOOD  URINALYSIS, ROUTINE W REFLEX MICROSCOPIC    5:50 PM Pt without any reproducible abd pain.  She has mild low back tenderness without recent fall.  She also has mild pain along right inner thigh.  Will obtain xray imaging  7:14 PM Pt improved She is ambulatory on her own  She has KCL at home will restart She has no focal abd tenderness Stable for d/c   MDM  Nursing notes including past medical history and social history reviewed and considered in documentation Labs/vital reviewed and considered xrays reviewed and considered        Date: 11/08/2012  Rate: 60  Rhythm: normal sinus rhythm  QRS Axis: left  Intervals: normal  ST/T Wave abnormalities: nonspecific ST changes  Conduction Disutrbances:1st degree block  Narrative Interpretation:   Old EKG Reviewed: unchanged    Joya Gaskins, MD 11/08/12 [phone]

## 2012-11-08 NOTE — ED Notes (Signed)
C/o aching in lower back and abd x 2 weeks. Denies n/v/d. Denies urinary s/s.

## 2012-11-08 NOTE — ED Notes (Signed)
Pt ambulated 93ft to bathroom and back to room with minimal assistance. Pt denied any dizzyness during walking.

## 2013-11-16 ENCOUNTER — Emergency Department (HOSPITAL_COMMUNITY): Payer: Medicare Other

## 2013-11-16 ENCOUNTER — Encounter (HOSPITAL_COMMUNITY): Payer: Self-pay | Admitting: Emergency Medicine

## 2013-11-16 ENCOUNTER — Emergency Department (HOSPITAL_COMMUNITY)
Admission: EM | Admit: 2013-11-16 | Discharge: 2013-11-16 | Disposition: A | Discharge status: Home / Self Care | Payer: Medicare Other | Attending: Emergency Medicine | Admitting: Emergency Medicine

## 2013-11-16 DIAGNOSIS — Z79899 Other long term (current) drug therapy: Secondary | ICD-10-CM | POA: Diagnosis not present

## 2013-11-16 DIAGNOSIS — S43401A Unspecified sprain of right shoulder joint, initial encounter: Secondary | ICD-10-CM

## 2013-11-16 DIAGNOSIS — Y939 Activity, unspecified: Secondary | ICD-10-CM | POA: Insufficient documentation

## 2013-11-16 DIAGNOSIS — I10 Essential (primary) hypertension: Secondary | ICD-10-CM | POA: Insufficient documentation

## 2013-11-16 DIAGNOSIS — IMO0002 Reserved for concepts with insufficient information to code with codable children: Secondary | ICD-10-CM | POA: Diagnosis not present

## 2013-11-16 DIAGNOSIS — Y92009 Unspecified place in unspecified non-institutional (private) residence as the place of occurrence of the external cause: Secondary | ICD-10-CM | POA: Diagnosis not present

## 2013-11-16 DIAGNOSIS — Z880 Allergy status to penicillin: Secondary | ICD-10-CM | POA: Diagnosis not present

## 2013-11-16 DIAGNOSIS — M129 Arthropathy, unspecified: Secondary | ICD-10-CM | POA: Diagnosis not present

## 2013-11-16 DIAGNOSIS — W010XXA Fall on same level from slipping, tripping and stumbling without subsequent striking against object, initial encounter: Secondary | ICD-10-CM | POA: Insufficient documentation

## 2013-11-16 DIAGNOSIS — S300XXA Contusion of lower back and pelvis, initial encounter: Secondary | ICD-10-CM | POA: Diagnosis not present

## 2013-11-16 DIAGNOSIS — W19XXXA Unspecified fall, initial encounter: Secondary | ICD-10-CM

## 2013-11-16 NOTE — ED Notes (Signed)
Per patient fell on ground on Monday hitting back and right arm. Patient denies LOC. Patient c/o lower back pain and right arm pain. Per patient "can hardly lift arm." Patient took Tramadol at 5 this morning with some relief.

## 2013-11-16 NOTE — ED Notes (Signed)
Patient with no complaints at this time. Respirations even and unlabored. Skin warm/dry. Discharge instructions reviewed with patient at this time. Patient given opportunity to voice concerns/ask questions. Patient discharged at this time and left Emergency Department with steady gait.   

## 2013-11-16 NOTE — ED Provider Notes (Signed)
Medical screening examination/treatment/procedure(s) were conducted as a shared visit with non-physician practitioner(s) and myself.  I personally evaluated the patient during the encounter.  EKG Interpretation   None        Mechanical fall several days ago, complaining of low back pain and shoulder pain. No head injury.   Charles B. Bernette Mayers, MD 11/16/13 1250

## 2013-11-16 NOTE — ED Provider Notes (Signed)
CSN: [id number]     Arrival date & time 11/16/13  1010 History   First MD Initiated Contact with Patient 11/16/13 1102     Chief Complaint  Patient presents with  . Fall  . Back Pain  . Arm Pain   (Consider location/radiation/quality/duration/timing/severity/associated sxs/prior Treatment) HPI Lynn Nichols is a 79 y.o. female who presents to Emergency Department after a fall. Pt states she went out on her porch and states she slipped off of a step that is at the end of her porch and feel backwards onto her bottom and her right side. Pt states she thinks she was "closer to the end of the porch than I thought I was, and stepped off of it." States she did not hit her head on the ground and does not have headache. She denies pain in her neck or her back. States having pain in right shoulder and tailbone. Denies feeling dizzy or light headed. Her daughter was in the house and heard pt hollering after falling down. She states she was sitting on the bottom when she came outside. She helped her up. Since then pt is ambulatory with pain. Took tramadol with good symptom relief. Denies any other injuries.   Past Medical History  Diagnosis Date  . Arthritis   . Hypertension   . Back pain    Past Surgical History  Procedure Laterality Date  . Cholecystectomy    . Abdominal hysterectomy     Family History  Problem Relation Age of Onset  . Diabetes Other    History  Substance Use Topics  . Smoking status: Never Smoker   . Smokeless tobacco: Current User    Types: Snuff  . Alcohol Use: No   OB History   Grav Para Term Preterm Abortions TAB SAB Ect Mult Living   14 13 13  1  1   6      Review of Systems  Constitutional: Negative for fever and chills.  Respiratory: Negative for cough, chest tightness and shortness of breath.   Cardiovascular: Negative for chest pain, palpitations and leg swelling.  Gastrointestinal: Negative for nausea, vomiting, abdominal pain and diarrhea.   Genitourinary: Negative for dysuria.  Musculoskeletal: Positive for arthralgias. Negative for neck pain and neck stiffness.  Skin: Negative for rash.  Neurological: Negative for dizziness, syncope, weakness and headaches.  All other systems reviewed and are negative.    Allergies  Penicillins  Home Medications   Current Outpatient Rx  Name  Route  Sig  Dispense  Refill  . albuterol (PROAIR HFA) 108 (90 BASE) MCG/ACT inhaler   Inhalation   Inhale 2 puffs into the lungs every 6 (six) hours as needed. For shortness of breath         . aspirin 81 MG chewable tablet   Oral   Chew 81 mg by mouth daily as needed for headache.         . Camphor-Eucalyptus-Menthol (VICKS VAPORUB EX)   Apply externally   Apply 1 application topically daily as needed (pain).         . hydrochlorothiazide (HYDRODIURIL) 25 MG tablet   Oral   Take 25 mg by mouth 2 (two) times daily.          Marland Kitchen levobunolol (BETAGAN) 0.5 % ophthalmic solution   Both Eyes   Place 1 drop into both eyes 2 (two) times daily.         . Menthol-Methyl Salicylate (MUSCLE RUB) 10-15 % CREA   Topical  Apply 1 application topically as needed for muscle pain.         Marland Kitchen NIFEdipine (PROCARDIA XL/ADALAT-CC) 90 MG 24 hr tablet   Oral   Take 90 mg by mouth every morning.          . potassium chloride (K-DUR) 10 MEQ tablet   Oral   Take 10 mEq by mouth every morning.         . traMADol (ULTRAM) 50 MG tablet   Oral   Take 50 mg by mouth 3 (three) times daily as needed for moderate pain.          BP 178/80  Pulse 95  Temp(Src) 97.8 F (36.6 C) (Oral)  Resp 18  Ht 5\' 4"  (1.626 m)  Wt 130 lb (58.968 kg)  BMI 22.30 kg/m2  SpO2 98% Physical Exam  Nursing note and vitals reviewed. Constitutional: She is oriented to person, place, and time. She appears well-developed and well-nourished. No distress.  HENT:  Head: Normocephalic.  Eyes: Conjunctivae and EOM are normal. Pupils are equal, round, and reactive  to light.  Neck: Normal range of motion. Neck supple.  Cardiovascular: Normal rate, regular rhythm and normal heart sounds.   Pulmonary/Chest: Effort normal and breath sounds normal. No respiratory distress. She has no wheezes. She has no rales.  Abdominal: Soft. Bowel sounds are normal. She exhibits no distension. There is no tenderness. There is no rebound.  Musculoskeletal: She exhibits no edema.  No tenderness over right shoulder joint or clavicle. Patient is tender over proximal humerus. No deformity noted. Pain with range of motion of the shoulder joint. No pain with elbow or wrist movement. Patient is tender over sacrum and coccyx. Pain with flexion, internal and external rotation of bilateral hips. No tenderness over cervical, thoracic, lumbar midline spine.  Neurological: She is alert and oriented to person, place, and time. No cranial nerve deficit. Coordination normal.  5/5 and equal upper and lower extremity strength bilaterally  Skin: Skin is warm and dry.  Psychiatric: She has a normal mood and affect. Her behavior is normal.    ED Course  Procedures (including critical care time) Labs Review Labs Reviewed - No data to display Imaging Review Dg Sacrum/coccyx  11/16/2013   CLINICAL DATA:  Fall. Back pain. Arm pain with right shoulder pain. Sacrococcygeal pain.  EXAM: SACRUM AND COCCYX - 2+ VIEW  COMPARISON:  11/08/2012.  FINDINGS: Pubic symphysis degenerative disease is present. There is no sacral fracture or coccygeal fracture. L5-S1 degenerative disc disease. Visualized obturator rings appear intact.  IMPRESSION: Negative sacrum and coccyx views.  No acute abnormality.   Electronically Signed   By: Andreas Newport M.D.   On: 11/16/2013 12:29   Dg Shoulder Right  11/16/2013   CLINICAL DATA:  Fall. Back pain. Arm pain with right shoulder pain. Sacrococcygeal pain.  EXAM: RIGHT SHOULDER - 2+ VIEW  COMPARISON:  None.  FINDINGS: Mild AC joint and glenohumeral osteoarthritis is  present. There is no fracture. Shoulder is located on the axillary view.  IMPRESSION: Mild glenohumeral and AC joint osteoarthritis. No acute osseous abnormality.   Electronically Signed   By: Andreas Newport M.D.   On: 11/16/2013 12:27   Dg Hip Bilateral W/pelvis  11/16/2013   CLINICAL DATA:  Fall. Back pain. Arm pain with right shoulder pain. Sacrococcygeal pain. Bilateral hip pain.  EXAM: BILATERAL HIP WITH PELVIS - 4+ VIEW  COMPARISON:  03/01/2012.  FINDINGS: Obturator rings are intact. Hips appear located bilaterally. Pubic symphysis degenerative  disease. Partially visualized lumbar spondylosis. There is no femur fracture.  IMPRESSION: No acute osseous abnormality.   Electronically Signed   By: Andreas Newport M.D.   On: 11/16/2013 12:31    EKG Interpretation   None       MDM   1. Fall, initial encounter   2. Coccyx contusion, initial encounter   3. Shoulder sprain, right, initial encounter     Pt is here after a mechanical fall. No head injury. Fall occurred 3 days ago. She is having pain in pelvis, tailbone, right shoulder. X-rays obtained and are negative. She is ambulated up and down the hallways with no problems. She lives with her family that are able to help her at home. She will be discharged home with follow up with primary care doctor. Blood pressure elevated. Will recheck and follow up with pcp.  Filed Vitals:   11/16/13 1035  BP: 178/80  Pulse: 95  Temp: 97.8 F (36.6 C)  TempSrc: Oral  Resp: 18  Height: 5\' 4"  (1.626 m)  Weight: 130 lb (58.968 kg)  SpO2: 98%       Loreto Loescher A Juanelle Trueheart, PA-C 11/16/13 1248

## 2013-11-16 NOTE — ED Notes (Signed)
Ambulated in hallway with cane. No assistance needed.

## 2014-06-30 ENCOUNTER — Encounter (HOSPITAL_COMMUNITY): Payer: Self-pay | Admitting: Emergency Medicine

## 2014-06-30 ENCOUNTER — Inpatient Hospital Stay (HOSPITAL_COMMUNITY)
Admission: EM | Admit: 2014-06-30 | Discharge: 2014-07-06 | DRG: 378 | Disposition: A | Discharge status: Home Health | Payer: PRIVATE HEALTH INSURANCE | Attending: Family Medicine | Admitting: Family Medicine

## 2014-06-30 DIAGNOSIS — K5900 Constipation, unspecified: Secondary | ICD-10-CM | POA: Diagnosis present

## 2014-06-30 DIAGNOSIS — F17200 Nicotine dependence, unspecified, uncomplicated: Secondary | ICD-10-CM | POA: Diagnosis present

## 2014-06-30 DIAGNOSIS — K922 Gastrointestinal hemorrhage, unspecified: Secondary | ICD-10-CM

## 2014-06-30 DIAGNOSIS — D649 Anemia, unspecified: Secondary | ICD-10-CM

## 2014-06-30 DIAGNOSIS — Z8249 Family history of ischemic heart disease and other diseases of the circulatory system: Secondary | ICD-10-CM

## 2014-06-30 DIAGNOSIS — D696 Thrombocytopenia, unspecified: Secondary | ICD-10-CM | POA: Diagnosis present

## 2014-06-30 DIAGNOSIS — Z833 Family history of diabetes mellitus: Secondary | ICD-10-CM

## 2014-06-30 DIAGNOSIS — Z79899 Other long term (current) drug therapy: Secondary | ICD-10-CM

## 2014-06-30 DIAGNOSIS — E876 Hypokalemia: Secondary | ICD-10-CM | POA: Diagnosis present

## 2014-06-30 DIAGNOSIS — D62 Acute posthemorrhagic anemia: Secondary | ICD-10-CM | POA: Diagnosis present

## 2014-06-30 DIAGNOSIS — M129 Arthropathy, unspecified: Secondary | ICD-10-CM | POA: Diagnosis present

## 2014-06-30 DIAGNOSIS — I10 Essential (primary) hypertension: Secondary | ICD-10-CM | POA: Diagnosis present

## 2014-06-30 DIAGNOSIS — K5731 Diverticulosis of large intestine without perforation or abscess with bleeding: Secondary | ICD-10-CM | POA: Diagnosis present

## 2014-06-30 DIAGNOSIS — K625 Hemorrhage of anus and rectum: Secondary | ICD-10-CM | POA: Diagnosis present

## 2014-06-30 DIAGNOSIS — Z66 Do not resuscitate: Secondary | ICD-10-CM | POA: Diagnosis present

## 2014-06-30 DIAGNOSIS — Z7982 Long term (current) use of aspirin: Secondary | ICD-10-CM | POA: Diagnosis not present

## 2014-06-30 DIAGNOSIS — K633 Ulcer of intestine: Secondary | ICD-10-CM | POA: Diagnosis present

## 2014-06-30 LAB — CBC
HEMATOCRIT: 26.3 % — AB (ref 36.0–46.0)
HEMOGLOBIN: 8.6 g/dL — AB (ref 12.0–15.0)
MCH: 29.3 pg (ref 26.0–34.0)
MCHC: 32.7 g/dL (ref 30.0–36.0)
MCV: 89.5 fL (ref 78.0–100.0)
Platelets: 147 10*3/uL — ABNORMAL LOW (ref 150–400)
RBC: 2.94 MIL/uL — ABNORMAL LOW (ref 3.87–5.11)
RDW: 13.9 % (ref 11.5–15.5)
WBC: 5.2 10*3/uL (ref 4.0–10.5)

## 2014-06-30 LAB — COMPREHENSIVE METABOLIC PANEL
ALBUMIN: 3.6 g/dL (ref 3.5–5.2)
ALK PHOS: 39 U/L (ref 39–117)
ALT: 10 U/L (ref 0–35)
ANION GAP: 14 (ref 5–15)
AST: 22 U/L (ref 0–37)
BILIRUBIN TOTAL: 0.3 mg/dL (ref 0.3–1.2)
BUN: 15 mg/dL (ref 6–23)
CHLORIDE: 102 meq/L (ref 96–112)
CO2: 25 mEq/L (ref 19–32)
Calcium: 8.8 mg/dL (ref 8.4–10.5)
Creatinine, Ser: 0.86 mg/dL (ref 0.50–1.10)
GFR calc Af Amer: 63 mL/min — ABNORMAL LOW (ref >90)
GFR calc non Af Amer: 54 mL/min — ABNORMAL LOW (ref >90)
Glucose, Bld: 149 mg/dL — ABNORMAL HIGH (ref 70–99)
POTASSIUM: 3.1 meq/L — AB (ref 3.7–5.3)
Sodium: 141 mEq/L (ref 137–147)
Total Protein: 7.4 g/dL (ref 6.0–8.3)

## 2014-06-30 LAB — PROTIME-INR
INR: 1.14 (ref 0.00–1.49)
Prothrombin Time: 14.6 seconds (ref 11.6–15.2)

## 2014-06-30 LAB — CBC WITH DIFFERENTIAL/PLATELET
BASOS ABS: 0 10*3/uL (ref 0.0–0.1)
Basophils Relative: 1 % (ref 0–1)
Eosinophils Absolute: 0.2 10*3/uL (ref 0.0–0.7)
Eosinophils Relative: 3 % (ref 0–5)
HEMATOCRIT: 30.6 % — AB (ref 36.0–46.0)
HEMOGLOBIN: 10.1 g/dL — AB (ref 12.0–15.0)
LYMPHS ABS: 2.1 10*3/uL (ref 0.7–4.0)
LYMPHS PCT: 33 % (ref 12–46)
MCH: 29.4 pg (ref 26.0–34.0)
MCHC: 33 g/dL (ref 30.0–36.0)
MCV: 89.2 fL (ref 78.0–100.0)
MONO ABS: 0.5 10*3/uL (ref 0.1–1.0)
Monocytes Relative: 8 % (ref 3–12)
NEUTROS ABS: 3.7 10*3/uL (ref 1.7–7.7)
Neutrophils Relative %: 55 % (ref 43–77)
Platelets: 182 10*3/uL (ref 150–400)
RBC: 3.43 MIL/uL — AB (ref 3.87–5.11)
RDW: 14 % (ref 11.5–15.5)
WBC: 6.5 10*3/uL (ref 4.0–10.5)

## 2014-06-30 LAB — HEMOGLOBIN AND HEMATOCRIT, BLOOD
HEMATOCRIT: 21.8 % — AB (ref 36.0–46.0)
HEMOGLOBIN: 7.4 g/dL — AB (ref 12.0–15.0)

## 2014-06-30 LAB — PREPARE RBC (CROSSMATCH)

## 2014-06-30 LAB — ABO/RH: ABO/RH(D): A POS

## 2014-06-30 MED ORDER — HYDROCHLOROTHIAZIDE 25 MG PO TABS
25.0000 mg | ORAL_TABLET | Freq: Two times a day (BID) | ORAL | Status: DC
  Administered 2014-06-30 – 2014-07-06 (×12): 25 mg via ORAL
  Filled 2014-06-30 (×13): qty 1

## 2014-06-30 MED ORDER — POLYETHYLENE GLYCOL 3350 17 G PO PACK
17.0000 g | PACK | Freq: Every day | ORAL | Status: DC | PRN
  Administered 2014-07-03: 17 g via ORAL
  Filled 2014-06-30: qty 1

## 2014-06-30 MED ORDER — SODIUM CHLORIDE 0.9 % IV SOLN
INTRAVENOUS | Status: AC
  Administered 2014-06-30 (×2): via INTRAVENOUS

## 2014-06-30 MED ORDER — SODIUM CHLORIDE 0.9 % IV SOLN: Freq: Once | INTRAVENOUS | Status: DC

## 2014-06-30 MED ORDER — SODIUM CHLORIDE 0.9 % IV SOLN: INTRAVENOUS | Status: AC

## 2014-06-30 MED ORDER — ONDANSETRON HCL 4 MG PO TABS: 4.0000 mg | ORAL_TABLET | Freq: Four times a day (QID) | ORAL | Status: DC | PRN

## 2014-06-30 MED ORDER — TRAMADOL HCL 50 MG PO TABS
50.0000 mg | ORAL_TABLET | Freq: Three times a day (TID) | ORAL | Status: DC | PRN
  Administered 2014-06-30 – 2014-07-04 (×5): 50 mg via ORAL
  Filled 2014-06-30 (×5): qty 1

## 2014-06-30 MED ORDER — POTASSIUM CHLORIDE 20 MEQ/15ML (10%) PO LIQD
40.0000 meq | Freq: Two times a day (BID) | ORAL | Status: DC
  Administered 2014-06-30 – 2014-07-06 (×13): 40 meq via ORAL
  Filled 2014-06-30 (×13): qty 30

## 2014-06-30 MED ORDER — ONDANSETRON HCL 4 MG/2ML IJ SOLN
4.0000 mg | Freq: Four times a day (QID) | INTRAMUSCULAR | Status: DC | PRN
  Administered 2014-07-04: 4 mg via INTRAVENOUS
  Filled 2014-06-30: qty 2

## 2014-06-30 MED ORDER — POTASSIUM CHLORIDE ER 10 MEQ PO TBCR: 10.0000 meq | EXTENDED_RELEASE_TABLET | Freq: Every morning | ORAL | Status: DC

## 2014-06-30 MED ORDER — LEVOBUNOLOL HCL 0.5 % OP SOLN
1.0000 [drp] | Freq: Two times a day (BID) | OPHTHALMIC | Status: DC
  Administered 2014-06-30 – 2014-07-06 (×11): 1 [drp] via OPHTHALMIC
  Filled 2014-06-30: qty 5

## 2014-06-30 MED ORDER — NIFEDIPINE ER OSMOTIC RELEASE 30 MG PO TB24
90.0000 mg | ORAL_TABLET | Freq: Every morning | ORAL | Status: DC
  Administered 2014-07-01 – 2014-07-06 (×6): 90 mg via ORAL
  Filled 2014-06-30 (×7): qty 3

## 2014-06-30 MED ORDER — POTASSIUM CHLORIDE 10 MEQ/100ML IV SOLN
10.0000 meq | Freq: Once | INTRAVENOUS | Status: DC
  Administered 2014-06-30: 10 meq via INTRAVENOUS
  Filled 2014-06-30: qty 100

## 2014-06-30 NOTE — H&P (Signed)
[facility] History and Physical  Lynn Nichols AOZ:[id number] DOB: [birth year] DOA: 06/30/2014  Referring physician: Dr. Preston Fleeting PCP: Reynolds Bowl, MD   Chief Complaint: bright red blood per rectum  HPI: Lynn Nichols is very functional 80 y.o. female takes care of her great great grandkids With past medical history of hypertension and arthritis comes in with dark blood this started on the day of admission. She denies any nausea vomiting abdominal pain, chest pain, tightness or pressure. She relates mild lightheadedness. She denies any oral anticoagulation except for antiplatelet therapy (aspirin). She relates she woke up on the morning of admission and saw blood coming down her legs. Sats in the toilet and it  was bright red.  In the ED: Rectal exam was done by Dr. click that showed bright red blood per rectum CBC was done that shows a hemoglobin of 10 (previous hemoglobin was 14 on 11/08/2012) blood pressure stable so we're consulted to admit. Gastroenterology has already been consulted.  Review of Systems:  Constitutional:  No weight loss, night sweats, Fevers, chills, fatigue.  HEENT:  No headaches, Difficulty swallowing,Tooth/dental problems,Sore throat,  No sneezing, itching, ear ache, nasal congestion, post nasal drip,  Cardio-vascular:  No chest pain, Orthopnea, PND, swelling in lower extremities, anasarca, dizziness, palpitations  GI:  No heartburn, indigestion, abdominal pain, nausea, vomiting, diarrhea, change in bowel habits, loss of appetite  Resp:  No shortness of breath with exertion or at rest. No excess mucus, no productive cough, No non-productive cough, No coughing up of blood.No change in color of mucus.No wheezing.No chest wall deformity  Skin:  no rash or lesions.  GU:  no dysuria, change in color of urine, no urgency or frequency. No flank pain.  Musculoskeletal:  No joint pain or swelling. No decreased range of motion. No back pain.  Psych:  No  change in mood or affect. No depression or anxiety. No memory loss.   Past Medical History  Diagnosis Date  . Arthritis   . Hypertension   . Back pain    Past Surgical History  Procedure Laterality Date  . Cholecystectomy    . Abdominal hysterectomy     Social History:  reports that she has never smoked. Her smokeless tobacco use includes Snuff. She reports that she does not drink alcohol or use illicit drugs.  Allergies  Allergen Reactions  . Penicillins Other (See Comments)    "passes out"    Family History  Problem Relation Age of Onset  . Diabetes Other   . Hypertension Mother   . Hypertension Father      Prior to Admission medications   Medication Sig Start Date End Date Taking? Authorizing Provider  albuterol (PROAIR HFA) 108 (90 BASE) MCG/ACT inhaler Inhale 2 puffs into the lungs every 6 (six) hours as needed. For shortness of breath   Yes Historical Provider, MD  aspirin 81 MG chewable tablet Chew 81 mg by mouth daily as needed for headache.   Yes Historical Provider, MD  Camphor-Eucalyptus-Menthol (VICKS VAPORUB EX) Apply 1 application topically daily as needed (pain).   Yes Historical Provider, MD  hydrochlorothiazide (HYDRODIURIL) 25 MG tablet Take 25 mg by mouth 2 (two) times daily.    Yes Historical Provider, MD  Menthol-Methyl Salicylate (MUSCLE RUB) 10-15 % CREA Apply 1 application topically as needed for muscle pain.   Yes Historical Provider, MD  NIFEdipine (PROCARDIA XL/ADALAT-CC) 90 MG 24 hr tablet Take 90 mg by mouth every morning.    Yes Historical Provider, MD  potassium chloride (K-DUR) 10 MEQ tablet Take 10 mEq by mouth every morning.   Yes Historical Provider, MD  traMADol (ULTRAM) 50 MG tablet Take 50 mg by mouth 3 (three) times daily as needed for moderate pain.   Yes Historical Provider, MD  levobunolol (BETAGAN) 0.5 % ophthalmic solution Place 1 drop into both eyes 2 (two) times daily.    Historical Provider, MD   Physical Exam: Filed Vitals:    06/30/14 0639  BP: 140/55  Pulse: 92  Temp: 98.5 F (36.9 C)  TempSrc: Oral  Resp: 18  Height: 5\' 5"  (1.651 m)  Weight: 63.05 kg (139 lb)  SpO2: 95%    Wt Readings from Last 3 Encounters:  06/30/14 63.05 kg (139 lb)  11/16/13 58.968 kg (130 lb)  11/08/12 61.236 kg (135 lb)    General:  Appears calm and comfortable awake alert and oriented x3 Eyes: PERRL, normal lids, irises & conjunctiva ENT: grossly normal hearing, lips & tongue Neck: no LAD, masses or thyromegaly Cardiovascular: RRR, no m/r/g. No LE edema. Telemetry: SR, no arrhythmias  Respiratory: CTA bilaterally, no w/r/r. Normal respiratory effort. Abdomen: soft, ntnd Skin: no rash or induration seen on limited exam Musculoskeletal: grossly normal tone BUE/BLE Psychiatric: grossly normal mood and affect, speech fluent and appropriate Neurologic: grossly non-focal.          Labs on Admission:  Basic Metabolic Panel:  Recent Labs Lab 06/30/14 0638  NA 141  K 3.1*  CL 102  CO2 25  GLUCOSE 149*  BUN 15  CREATININE 0.86  CALCIUM 8.8   Liver Function Tests:  Recent Labs Lab 06/30/14 0638  AST 22  ALT 10  ALKPHOS 39  BILITOT 0.3  PROT 7.4  ALBUMIN 3.6   No results found for this basename: LIPASE, AMYLASE,  in the last 168 hours No results found for this basename: AMMONIA,  in the last 168 hours CBC:  Recent Labs Lab 06/30/14 0638  WBC 6.5  NEUTROABS 3.7  HGB 10.1*  HCT 30.6*  MCV 89.2  PLT 182   Cardiac Enzymes: No results found for this basename: CKTOTAL, CKMB, CKMBINDEX, TROPONINI,  in the last 168 hours  BNP (last 3 results) No results found for this basename: PROBNP,  in the last 8760 hours CBG: No results found for this basename: GLUCAP,  in the last 168 hours  Radiological Exams on Admission: No results found.  EKG: Independently reviewed. None  Assessment/Plan Acute lower gastrointestinal bleeding - Patient had a previous CT scan in 2013 that showed diverticulosis. She  relates she has never had a colonoscopy. She is only on aspirin. Her hemoglobin was 14 and 2013, on 8.1.2015 is 10 mg/dl. Emergency room physician has already consulted GI. Admitted to MedSurg, Place 2 large bores IV, start her on normal saline. Her blood pressure is borderline low, monitor strict I.'s and O's. Type and screen and check hemoglobin this afternoon. - Patient denies any NSAIDs use except aspirin 81 mg.    Essential hypertension - Hold Blood pressure medications currently borderline low.   Code Status: presumed DNR DVT Prophylaxis: SCD's Family Communication: daughters Disposition Plan: inpatient  Time spent: 80 minutes  Lynn Nichols, [place] [facility] Pager [phone]  **Disclaimer: This note may have been dictated with voice recognition software. Similar sounding words can inadvertently be transcribed and this note may contain transcription errors which may not have been corrected upon publication of note.**

## 2014-06-30 NOTE — ED Notes (Signed)
Pt states she got up this morning and took a bath, states after getting dressed she was sweeping the floor when she felt wet from behind and noticed some dark red blood with clots coming out from her rectum.  Pt denies pain or dizziness, denies n/v/d

## 2014-06-30 NOTE — ED Provider Notes (Signed)
CSN: [id number]     Arrival date & time 06/30/14  0625 History   None    Chief complaint: Rectal bleeding  (Consider location/radiation/quality/duration/timing/severity/associated sxs/prior Treatment) The history is provided by the patient.   80 year old female noted rectal bleeding this morning. Blood is dark red. There is no associated nausea or vomiting and no abdominal pain. She endorses mild lightheadedness. She denies chest pain, heaviness, tightness, pressure. She denies using any kind of anticoagulants other than low-dose aspirin. She's not had problems with rectal bleeding in the past.  Past Medical History  Diagnosis Date  . Arthritis   . Hypertension   . Back pain    Past Surgical History  Procedure Laterality Date  . Cholecystectomy    . Abdominal hysterectomy     Family History  Problem Relation Age of Onset  . Diabetes Other    History  Substance Use Topics  . Smoking status: Never Smoker   . Smokeless tobacco: Current User    Types: Snuff  . Alcohol Use: No   OB History   Grav Para Term Preterm Abortions TAB SAB Ect Mult Living   14 13 13  1  1   6      Review of Systems  All other systems reviewed and are negative.     Allergies  Penicillins  Home Medications   Prior to Admission medications   Medication Sig Start Date End Date Taking? Authorizing Provider  albuterol (PROAIR HFA) 108 (90 BASE) MCG/ACT inhaler Inhale 2 puffs into the lungs every 6 (six) hours as needed. For shortness of breath    Historical Provider, MD  aspirin 81 MG chewable tablet Chew 81 mg by mouth daily as needed for headache.    Historical Provider, MD  Camphor-Eucalyptus-Menthol (VICKS VAPORUB EX) Apply 1 application topically daily as needed (pain).    Historical Provider, MD  hydrochlorothiazide (HYDRODIURIL) 25 MG tablet Take 25 mg by mouth 2 (two) times daily.     Historical Provider, MD  levobunolol (BETAGAN) 0.5 % ophthalmic solution Place 1 drop into both eyes 2 (two)  times daily.    Historical Provider, MD  Menthol-Methyl Salicylate (MUSCLE RUB) 10-15 % CREA Apply 1 application topically as needed for muscle pain.    Historical Provider, MD  NIFEdipine (PROCARDIA XL/ADALAT-CC) 90 MG 24 hr tablet Take 90 mg by mouth every morning.     Historical Provider, MD  potassium chloride (K-DUR) 10 MEQ tablet Take 10 mEq by mouth every morning.    Historical Provider, MD  traMADol (ULTRAM) 50 MG tablet Take 50 mg by mouth 3 (three) times daily as needed for moderate pain.    Historical Provider, MD   BP 140/55  Pulse 92  Temp(Src) 98.5 F (36.9 C) (Oral)  Resp 18  Ht 5\' 5"  (1.651 m)  Wt 139 lb (63.05 kg)  BMI 23.13 kg/m2  SpO2 95% Physical Exam  Nursing note and vitals reviewed.  80 year old female, resting comfortably and in no acute distress. Vital signs are normal. Oxygen saturation is 95%, which is normal. Head is normocephalic and atraumatic. PERRLA, EOMI. Oropharynx is clear. Conjunctivae are pink. Neck is nontender and supple without adenopathy or JVD. Back is nontender and there is no CVA tenderness. Lungs are clear without rales, wheezes, or rhonchi. Chest is nontender. Heart has regular rate and rhythm with 1/6 murmur at the cardiac base. Abdomen is soft, flat, nontender without masses or hepatosplenomegaly and peristalsis is normoactive. Rectal: Sphincter tone slightly decreased, no masses. There  is a moderate amount of blood present in the rectal vault head is not red but not dark. Extremities have no cyanosis or edema, full range of motion is present. Skin is warm and dry without rash. Neurologic: Mental status is normal, cranial nerves are intact, there are no motor or sensory deficits.  ED Course  Procedures (including critical care time) Labs Review Results for orders placed during the hospital encounter of 06/30/14  CBC WITH DIFFERENTIAL      Result Value Ref Range   WBC 6.5  4.0 - 10.5 K/uL   RBC 3.43 (*) 3.87 - 5.11 MIL/uL    Hemoglobin 10.1 (*) 12.0 - 15.0 g/dL   HCT 16.130.6 (*) 09.636.0 - 04.546.0 %   MCV 89.2  78.0 - 100.0 fL   MCH 29.4  26.0 - 34.0 pg   MCHC 33.0  30.0 - 36.0 g/dL   RDW 40.914.0  81.111.5 - 91.415.5 %   Platelets 182  150 - 400 K/uL   Neutrophils Relative % 55  43 - 77 %   Neutro Abs 3.7  1.7 - 7.7 K/uL   Lymphocytes Relative 33  12 - 46 %   Lymphs Abs 2.1  0.7 - 4.0 K/uL   Monocytes Relative 8  3 - 12 %   Monocytes Absolute 0.5  0.1 - 1.0 K/uL   Eosinophils Relative 3  0 - 5 %   Eosinophils Absolute 0.2  0.0 - 0.7 K/uL   Basophils Relative 1  0 - 1 %   Basophils Absolute 0.0  0.0 - 0.1 K/uL  COMPREHENSIVE METABOLIC PANEL      Result Value Ref Range   Sodium 141  137 - 147 mEq/L   Potassium 3.1 (*) 3.7 - 5.3 mEq/L   Chloride 102  96 - 112 mEq/L   CO2 25  19 - 32 mEq/L   Glucose, Bld 149 (*) 70 - 99 mg/dL   BUN 15  6 - 23 mg/dL   Creatinine, Ser 7.820.86  0.50 - 1.10 mg/dL   Calcium 8.8  8.4 - 95.610.5 mg/dL   Total Protein 7.4  6.0 - 8.3 g/dL   Albumin 3.6  3.5 - 5.2 g/dL   AST 22  0 - 37 U/L   ALT 10  0 - 35 U/L   Alkaline Phosphatase 39  39 - 117 U/L   Total Bilirubin 0.3  0.3 - 1.2 mg/dL   GFR calc non Af Amer 54 (*) >90 mL/min   GFR calc Af Amer 63 (*) >90 mL/min   Anion gap 14  5 - 15  TYPE AND SCREEN      Result Value Ref Range   ABO/RH(D) A POS     Antibody Screen PENDING     Sample Expiration 07/03/2014     No results found.    MDM   Final diagnoses:  Lower gastrointestinal bleed  Hypokalemia    Rectal bleeding of uncertain cause. Old records are reviewed and she did have a CT scan in 2013 which showed evidence of colonic diverticulosis. This is the most likely source of her bleeding. Screening labs were obtained.  Hemoglobin is noted to have dropped 4 g compared with the last value from 20 months ago. Potassium is low at 3.1 and will be supplemented with intravenous potassium. Case is discussed with Dr. Radonna RickerFeliz of [facility] who agrees to admit the patient, and also with Dr.  Karilyn Cotaehman of gastroenterology service who agrees to see the patient in consultation.  Dione Booze, MD 06/30/14 [phone]

## 2014-06-30 NOTE — Progress Notes (Signed)
Patient has had two bloody bowel movements within the past hours.  Dr. Karilyn Cotaehman paged.  Returned page and gave order to check H&H now and every twelve hours and to call with the results.

## 2014-06-30 NOTE — Consult Note (Signed)
Referring Provider: Dr. Mable Paris, MD  Primary Care Physician:  Reynolds Bowl, MD Primary Gastroenterologist:  Dr. Karilyn Cota  Reason for Consultation:    Rectal bleeding and anemia.  HPI:   History is obtained from the patient and her daughters.  Patient is a 80 year old African female who gets up early every morning and was mopping the floors when she had an urge to have a bowel movement and make to the bathroom and began passing fresh blood and clots. She passed a large amount of blood. She felt nauseated and warm. She did not experience abdominal pain or vomiting. She did feel weak. 911 was called. She was checked by paramedics but declined to be brought to the hospital in an ambulance. She was brought by her daughter to the ER for evaluation. Since she's been in the hospital she has passed small amount of blood. She denies abdominal pain nausea or vomiting. She has good appetite and according to her grandson she may have lost couple of pounds recently but not much. She has constipation and tries to control with fiber and when necessary laxatives. She is on low-dose aspirin but does not take other NSAIDs. She has never undergone colonoscopy before. She denies heartburn or history of peptic ulcer disease. H&H in emergency room was 10.1 and 30.6 and repeat about 2 hours later was 8.6 and 26.3. She feels better and she is hungry. Patient lives at home with 2 of her daughters. She did snuff but does not smoke cigarettes or drink alcohol. 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  She had 7 sons and 5 daughters; 2 sons and 4 daughters are living; one son died of COPD; another one had laryngeal carcinoma and died in his 77s. One son died of liver cancer in 60s one had dementia and one daughter for her accident; her daughter died due to complications of diabetes.      Past Medical History  Diagnosis Date  . Arthritis   . Hypertension   . Back pain    left-sided  diverticulosis noted on CT of March 2015 when she was seen in emergency room for hip pain and urinary retention.  Past Surgical History  Procedure Laterality Date  . Cholecystectomy    . Abdominal hysterectomy      Prior to Admission medications   Medication Sig Start Date End Date Taking? Authorizing Provider  albuterol (PROAIR HFA) 108 (90 BASE) MCG/ACT inhaler Inhale 2 puffs into the lungs every 6 (six) hours as needed. For shortness of breath   Yes Historical Provider, MD  aspirin 81 MG chewable tablet Chew 81 mg by mouth daily.    Yes Historical Provider, MD  Camphor-Eucalyptus-Menthol (VICKS VAPORUB EX) Apply 1 application topically daily as needed (pain).   Yes Historical Provider, MD  hydrochlorothiazide (HYDRODIURIL) 25 MG tablet Take 25 mg by mouth 2 (two) times daily.    Yes Historical Provider, MD  Menthol-Methyl Salicylate (MUSCLE RUB) 10-15 % CREA Apply 1 application topically as needed for muscle pain.   Yes Historical Provider, MD  NIFEdipine (PROCARDIA XL/ADALAT-CC) 90 MG 24 hr tablet Take 90 mg by mouth every morning.    Yes Historical Provider, MD  potassium chloride (K-DUR) 10 MEQ tablet Take 10 mEq by mouth every morning.   Yes Historical Provider, MD  traMADol (ULTRAM) 50 MG tablet Take 50 mg by mouth 3 (three) times daily as needed for moderate pain.   Yes Historical Provider, MD  Current Facility-Administered Medications  Medication Dose Route Frequency Provider Last Rate Last Dose  . 0.9 %  sodium chloride infusion   Intravenous STAT Dione Booze, MD 125 mL/hr at 06/30/14 0850    . 0.9 %  sodium chloride infusion   Intravenous Continuous Marinda Elk, MD      . hydrochlorothiazide (HYDRODIURIL) tablet 25 mg  25 mg Oral BID Marinda Elk, MD      . levobunolol (BETAGAN) 0.5 % ophthalmic solution 1 drop  1 drop Both Eyes BID Marinda Elk, MD      . NIFEdipine (PROCARDIA-XL/ADALAT-CC/NIFEDICAL-XL) 24 hr tablet 90 mg  90 mg Oral q morning - 10a  Marinda Elk, MD      . ondansetron [facility]) tablet 4 mg  4 mg Oral Q6H PRN Marinda Elk, MD       Or  . ondansetron [facility]) injection 4 mg  4 mg Intravenous Q6H PRN Marinda Elk, MD      . polyethylene glycol [facility] Hal) packet 17 g  17 g Oral Daily PRN Marinda Elk, MD      . potassium chloride 20 MEQ/15ML (10%) liquid 40 mEq  40 mEq Oral BID Marinda Elk, MD      . traMADol Janean Sark) tablet 50 mg  50 mg Oral TID PRN Marinda Elk, MD        Allergies as of 06/30/2014 - Review Complete 06/30/2014  Allergen Reaction Noted  . Penicillins Other (See Comments) 02/11/2012    Family History  Problem Relation Age of Onset  . Diabetes Other   . Hypertension Mother   . Hypertension Father     History   Social History  . Marital Status: Widowed    Spouse Name: N/A    Number of Children: N/A  . Years of Education: N/A   Occupational History  . Not on file.   Social History Main Topics  . Smoking status: Never Smoker   . Smokeless tobacco: Current User    Types: Snuff  . Alcohol Use: No  . Drug Use: No  . Sexual Activity: No   Other Topics Concern  . Not on file   Social History Narrative  . No narrative on file    Review of Systems: See HPI, otherwise normal ROS  Physical Exam: Temp:  [98 F (36.7 C)-98.5 F (36.9 C)] 98 F (36.7 C) (08/01 0907) Pulse Rate:  [66-92] 66 (08/01 0907) Resp:  [18-20] 20 (08/01 0907) BP: (91-140)/(46-66) 109/46 mmHg (08/01 0907) SpO2:  [95 %-96 %] 96 % (08/01 0907) Weight:  [139 lb (63.05 kg)] 139 lb (63.05 kg) (08/01 1610)  Patient is alert and in no acute distress. Conjunctivae was pale. Sclerae nonicteric. Oropharyngeal exam reveals very poor dentition. Most of the teeth are broken. She has bony exostosis at hard palate. No neck masses or thyromegaly noted. Cardiac exam with occasional irregularity. Normal S1 and S2. She has grade 2/6 systolic ejection murmur best heard at aortic  area. Lungs are clear to auscultation. Abdomen symmetrical with long right subcostal scar. Bowel sounds are normal. No bruits noted. On palpation abdomen is soft and nontender without organomegaly or masses. Rectal examination deferred as she had one emergency room by Dr. Preston Fleeting revealing moderate amount of blood in rectum.     Lab Results:  Recent Labs  06/30/14 0638 06/30/14 0850  WBC 6.5 5.2  HGB 10.1* 8.6*  HCT 30.6* 26.3*  PLT 182 147*   H&H on  11/08/2012 was 14.4 and 41.9; recent H&H not available  BMET  Recent Labs  06/30/14 0638  NA 141  K 3.1*  CL 102  CO2 25  GLUCOSE 149*  BUN 15  CREATININE 0.86  CALCIUM 8.8   LFT  Recent Labs  06/30/14 0638  PROT 7.4  ALBUMIN 3.6  AST 22  ALT 10  ALKPHOS 39  BILITOT 0.3   PT/INR  Recent Labs  06/30/14 0850  LABPROT 14.6  INR 1.14    Assessment;  Patient is a pleasant healthy-appearing 80 year old African female who presents with large volume painless hematochezia. She may have stopped bleeding but at least it has slowed down. Suspect we're dealing with colonic diverticular bleed. She had multiple diverticuli involving left colon on CT of March, 2013. Anemia secondary to acute GI bleed; H&H was normal in December 2013 a recent one unknown. Patient does not desire to be evaluated at this time unless bleeding recurs in which case she would consider colonoscopy. Chronic constipation. She needs to be on fiber supplement and stool softener daily. She has mild hypokalemia and she is receiving oral KCl.   Recommendations;  Clear liquid diet. Serial H&H. Consider PRBC transfusion if hemoglobin drops to below 8. Hold aspirin for at least one week. Patient will be reevaluated in the a.m.   LOS: 0 days   REHMAN,NAJEEB U  06/30/2014, 11:41 AM

## 2014-06-30 NOTE — Progress Notes (Signed)
Utilization review Completed Dierdra Salameh RN BSN   

## 2014-06-30 NOTE — Progress Notes (Addendum)
Patient's hemoglobin 7.4.  Dr. Karilyn Cotaehman notified.  Recommended patient receiving 2 units PRBCs.  Requested RN notify Dr. Robb Matarrtiz for his recommendations.  Dr. Robb Matarrtiz paged and gave orders for patient to receive 2 units PRBCs (slow infusion) and to check CBC post transfusion.

## 2014-06-30 NOTE — ED Notes (Signed)
Pt being evaluated by admitting

## 2014-07-01 ENCOUNTER — Encounter (HOSPITAL_COMMUNITY): Payer: Self-pay | Admitting: *Deleted

## 2014-07-01 ENCOUNTER — Inpatient Hospital Stay (HOSPITAL_COMMUNITY): Payer: PRIVATE HEALTH INSURANCE

## 2014-07-01 ENCOUNTER — Encounter (HOSPITAL_COMMUNITY)
Admission: EM | Disposition: A | Discharge status: Home Health | Payer: Self-pay | Source: Home / Self Care | Attending: Family Medicine

## 2014-07-01 DIAGNOSIS — K5730 Diverticulosis of large intestine without perforation or abscess without bleeding: Secondary | ICD-10-CM

## 2014-07-01 DIAGNOSIS — Z538 Procedure and treatment not carried out for other reasons: Secondary | ICD-10-CM

## 2014-07-01 HISTORY — PX: COLONOSCOPY: SHX5424

## 2014-07-01 LAB — CBC
HEMATOCRIT: 29.4 % — AB (ref 36.0–46.0)
HEMOGLOBIN: 10 g/dL — AB (ref 12.0–15.0)
MCH: 29.5 pg (ref 26.0–34.0)
MCHC: 34 g/dL (ref 30.0–36.0)
MCV: 86.7 fL (ref 78.0–100.0)
Platelets: 112 10*3/uL — ABNORMAL LOW (ref 150–400)
RBC: 3.39 MIL/uL — ABNORMAL LOW (ref 3.87–5.11)
RDW: 14.2 % (ref 11.5–15.5)
WBC: 8.5 10*3/uL (ref 4.0–10.5)

## 2014-07-01 LAB — TYPE AND SCREEN
ABO/RH(D): A POS
Antibody Screen: NEGATIVE
UNIT DIVISION: 0
Unit division: 0

## 2014-07-01 LAB — HEMOGLOBIN AND HEMATOCRIT, BLOOD
HCT: 28.8 % — ABNORMAL LOW (ref 36.0–46.0)
HCT: 32.3 % — ABNORMAL LOW (ref 36.0–46.0)
HCT: 30.4 % — ABNORMAL LOW (ref 36.0–46.0)
HEMOGLOBIN: 11.2 g/dL — AB (ref 12.0–15.0)
Hemoglobin: 9.9 g/dL — ABNORMAL LOW (ref 12.0–15.0)
Hemoglobin: 10.6 g/dL — ABNORMAL LOW (ref 12.0–15.0)

## 2014-07-01 SURGERY — COLONOSCOPY
Anesthesia: Moderate Sedation

## 2014-07-01 MED ORDER — HEPARIN SOD (PORK) LOCK FLUSH 100 UNIT/ML IV SOLN
INTRAVENOUS | Status: AC
Start: 2014-07-01 — End: 2014-07-02
  Filled 2014-07-01: qty 5

## 2014-07-01 MED ORDER — MIDAZOLAM HCL 5 MG/5ML IJ SOLN
INTRAMUSCULAR | Status: AC
  Filled 2014-07-01: qty 10

## 2014-07-01 MED ORDER — PEG 3350-KCL-NA BICARB-NACL 420 G PO SOLR
4000.0000 mL | Freq: Once | ORAL | Status: AC
  Administered 2014-07-01: 4000 mL via ORAL
  Filled 2014-07-01: qty 4000

## 2014-07-01 MED ORDER — MIDAZOLAM HCL 5 MG/5ML IJ SOLN
INTRAMUSCULAR | Status: DC | PRN
  Administered 2014-07-01: 1 mg via INTRAVENOUS

## 2014-07-01 MED ORDER — TECHNETIUM TC 99M-LABELED RED BLOOD CELLS IV KIT
25.0000 | PACK | Freq: Once | INTRAVENOUS | Status: AC | PRN
  Administered 2014-07-01: 27 via INTRAVENOUS

## 2014-07-01 MED ORDER — SODIUM CHLORIDE 0.9 % IV SOLN: INTRAVENOUS | Status: AC

## 2014-07-01 MED ORDER — MEPERIDINE HCL 50 MG/ML IJ SOLN
INTRAMUSCULAR | Status: AC
  Filled 2014-07-01: qty 1

## 2014-07-01 NOTE — Progress Notes (Signed)
Patient's stools still maroon colored and has drank over half of the Golytely prep.  Shawna OrleansMelanie, RN in Endo notified.  Will continue to monitor.

## 2014-07-01 NOTE — Progress Notes (Signed)
INITIAL NUTRITION ASSESSMENT  DOCUMENTATION CODES Per approved criteria  -Not Applicable   INTERVENTION: Resource Breeze po BID, each supplement provides 250 kcal and 9 grams of protein   NUTRITION DIAGNOSIS: Inadequate oral intake related to limited food and beverage choices as evidenced by clear liquids diet.   Goal: Pt to meet >/= 90% of their estimated nutrition needs, if pt desires aggressive nutrition measures  Monitor:  Diet advancement, percent meal intake  Reason for Assessment: Malnutrition Screen Score =  2  80 y.o. female  ASSESSMENT: Pt has rectal bleed and is scheduled for colonoscopy today. Her home diet is regular. No significant weight changes per hx  Height: Ht Readings from Last 1 Encounters:  06/30/14 5\' 5"  (1.651 m)    Weight: Wt Readings from Last 1 Encounters:  06/30/14 139 lb (63.05 kg)    Ideal Body Weight: 125#  % Ideal Body Weight: 111%  Wt Readings from Last 10 Encounters:  06/30/14 139 lb (63.05 kg)  06/30/14 139 lb (63.05 kg)  11/16/13 130 lb (58.968 kg)  11/08/12 135 lb (61.236 kg)  02/11/12 143 lb (64.864 kg)    Usual Body Weight: 130-140#  % Usual Body Weight: 100%  BMI:  Body mass index is 23.13 kg/(m^2).normal range  Estimated Nutritional Needs: Kcal: 1300-1500 Protein: 70-80 gr Fluid: >1300 ml daily  Skin: skin intact  Diet Order: Clear Liquid  EDUCATION NEEDS: -No education needs identified at this time   Intake/Output Summary (Last 24 hours) at 07/01/14 1355 Last data filed at 07/01/14 1134  Gross per 24 hour  Intake 2078.75 ml  Output   3350 ml  Net -1271.25 ml    Last BM: 07/01/14-maroon stool  Labs:   Recent Labs Lab 06/30/14 0638  NA 141  K 3.1*  CL 102  CO2 25  BUN 15  CREATININE 0.86  CALCIUM 8.8  GLUCOSE 149*    CBG (last 3)  No results found for this basename: GLUCAP,  in the last 72 hours  Scheduled Meds: . sodium chloride   Intravenous Once  . hydrochlorothiazide  25 mg Oral  BID  . levobunolol  1 drop Both Eyes BID  . NIFEdipine  90 mg Oral q morning - 10a  . potassium chloride  40 mEq Oral BID    Continuous Infusions:   Past Medical History  Diagnosis Date  . Arthritis   . Hypertension   . Back pain     Past Surgical History  Procedure Laterality Date  . Cholecystectomy    . Abdominal hysterectomy      Royann ShiversLynn Maylin [place] MS,RD,CSG,LDN Office: #[phone] Pager: [phone]

## 2014-07-01 NOTE — Progress Notes (Signed)
Patient experience rectal bleeding began yesterday and required 2 units of PRBCs. Her hemoglobin dropped to low of 7.4 g and posttransfusion up to 9.9 g. Patient has agreed to proceed with colonoscopy and she is being prepped for exam to be performed later today.

## 2014-07-01 NOTE — Progress Notes (Signed)
Patient to have bleeding scan with nuclear medicine.  Nuclear medicine called RN to notify that exam will take at least two hours.  Patient may remain on tele; however, no nursing staff are present in Nuclear Medicine at this time.  Dr. Robb Matarrtiz text paged and notified.  Dr. Robb Matarrtiz returned page and stated that it is fine for the patient to go to the exam without the nurse present and may be off of telemtry.  Regina in Nuclear Medicine notified.

## 2014-07-01 NOTE — Progress Notes (Signed)
Complained of supra pubic pain. Checked documentation for void. 450 urine output documented since admit. Bladder scan showed greater than 900. Orders obtained for foley cath due to urinary retention.

## 2014-07-01 NOTE — Progress Notes (Signed)
[facility] PROGRESS NOTE Interim History: 80 y.o. female takes care of her great great grandkids With past medical history of hypertension and arthritis comes in with dark blood this started on the day of admission. She denies any nausea vomiting abdominal pain, chest pain, tightness or pressure  Assessment/Plan: Acute lower gastrointestinal bleeding - s/p 2 units PRBC due to multiple Bloody BM Hbg 10.0 - Appreciate GI assistance. Colonoscopy hopefully today.  Essential hypertension: - BP stable.  Code Status: presumed DNR  DVT Prophylaxis: SCD's  Family Communication: daughters  Disposition Plan: inpatient   Consultants:  GI  Procedures:  none  Antibiotics:  None  HPI/Subjective: No complains wants to go home.  Objective: Filed Vitals:   06/30/14 2330 07/01/14 0035 07/01/14 0130 07/01/14 0552  BP: 131/50 118/47 149/66 151/54  Pulse: 64 61 76 77  Temp: 98.1 F (36.7 C) 98.2 F (36.8 C) 98.3 F (36.8 C) 98.8 F (37.1 C)  TempSrc: Oral Oral Oral Oral  Resp: 18 18 20 20   Height:      Weight:      SpO2: 100% 98% 100% 99%    Intake/Output Summary (Last 24 hours) at 07/01/14 1116 Last data filed at 07/01/14 0843  Gross per 24 hour  Intake 2078.75 ml  Output   2500 ml  Net -421.25 ml   Filed Weights   06/30/14 0639  Weight: 63.05 kg (139 lb)    Exam:  General: Alert, awake, oriented x3, in no acute distress.  HEENT: No bruits, no goiter.  Heart: Regular rate and rhythm.  Lungs: Good air movement, clear Abdomen: Soft, nontender, nondistended, positive bowel sounds.    Data Reviewed: Basic Metabolic Panel:  Recent Labs Lab 06/30/14 0638  NA 141  K 3.1*  CL 102  CO2 25  GLUCOSE 149*  BUN 15  CREATININE 0.86  CALCIUM 8.8   Liver Function Tests:  Recent Labs Lab 06/30/14 0638  AST 22  ALT 10  ALKPHOS 39  BILITOT 0.3  PROT 7.4  ALBUMIN 3.6   No results found for this basename: LIPASE, AMYLASE,  in the last 168 hours No  results found for this basename: AMMONIA,  in the last 168 hours CBC:  Recent Labs Lab 06/30/14 0638 06/30/14 0850 06/30/14 1710 07/01/14 0511 07/01/14 0843  WBC 6.5 5.2  --   --  8.5  NEUTROABS 3.7  --   --   --   --   HGB 10.1* 8.6* 7.4* 9.9* 10.0*  HCT 30.6* 26.3* 21.8* 28.8* 29.4*  MCV 89.2 89.5  --   --  86.7  PLT 182 147*  --   --  112*   Cardiac Enzymes: No results found for this basename: CKTOTAL, CKMB, CKMBINDEX, TROPONINI,  in the last 168 hours BNP (last 3 results) No results found for this basename: PROBNP,  in the last 8760 hours CBG: No results found for this basename: GLUCAP,  in the last 168 hours  No results found for this or any previous visit (from the past 240 hour(s)).   Studies: No results found.  Scheduled Meds: . sodium chloride   Intravenous Once  . hydrochlorothiazide  25 mg Oral BID  . levobunolol  1 drop Both Eyes BID  . NIFEdipine  90 mg Oral q morning - 10a  . potassium chloride  40 mEq Oral BID   Continuous Infusions:    Lynn Nichols, Lynn Nichols  [facility] Pager [phone]. If 8PM-8AM, please contact night-coverage at [email or web address], password [email or web address] 07/01/2014, 11:16  AM  LOS: 1 day    **Disclaimer: This note may have been dictated with voice recognition software. Similar sounding words can inadvertently be transcribed and this note may contain transcription errors which may not have been corrected upon publication of note.**

## 2014-07-01 NOTE — Op Note (Signed)
COLONOSCOPY PROCEDURE REPORT  PATIENT:  Lynn Nichols  MR#:  [id number] Birthdate:  Jun 27, 1914, 80 y.o., female Endoscopist:  Dr. Malissa HippoNajeeb U. Pippa Hanif, MD Referred By:  Dr. Marinda ElkAbraham Feliz-Ortiz, MD  Procedure Date: 07/01/2014  Procedure:   Colonoscopy(incomplete).  Indications:  Patient is a 80 year old African female who presents with large volume hematochezia. She has received 2 units of PRBCs. She is undergoing colonoscopy with therapeutic intention.  Informed Consent:  The procedure and risks were reviewed with the patient and informed consent was obtained.  Medications:  Versed 1mg  IV  Description of procedure:  After a digital rectal exam was performed, that colonoscope was advanced from the anus through the rectum and colon to the area of proximal sigmoid colon. The view was very limited because of clots stool and blood. Multiple diverticula are seen in the segment that was examined but none with stigmata of bleeding. Procedure not completed because of presence of blood and stool.  Findings:   Limited exam because the presence of blood in stool. Multiple diverticula noted in the sigmoid colon with non-mid stigmata of bleed.   Therapeutic/Diagnostic Maneuvers Performed:  None  Complications:  None  Cecal Withdrawal Time:  NA  Impression:  Examination could not be completed because of presence of large amount of blood stool and clots. Multiple diverticula in sigmoid colon but none with stigmata of bleed.  Recommendations:  H&H now and then every 6 hours. GI bleeding scan. If scan is positive will consider abdominal angiography  [name]  07/01/2014 3:05 PM  CC: Dr. Reynolds BowlOMSTOCK, LLOYD, MD & Dr. No ref. provider found

## 2014-07-02 LAB — BASIC METABOLIC PANEL
ANION GAP: 12 (ref 5–15)
BUN: 12 mg/dL (ref 6–23)
CALCIUM: 8 mg/dL — AB (ref 8.4–10.5)
CO2: 23 meq/L (ref 19–32)
Chloride: 105 mEq/L (ref 96–112)
Creatinine, Ser: 0.72 mg/dL (ref 0.50–1.10)
GFR calc Af Amer: 80 mL/min — ABNORMAL LOW (ref >90)
GFR, EST NON AFRICAN AMERICAN: 69 mL/min — AB (ref >90)
Glucose, Bld: 86 mg/dL (ref 70–99)
Potassium: 3.6 mEq/L — ABNORMAL LOW (ref 3.7–5.3)
SODIUM: 140 meq/L (ref 137–147)

## 2014-07-02 LAB — CBC
HEMATOCRIT: 31.9 % — AB (ref 36.0–46.0)
Hemoglobin: 10.8 g/dL — ABNORMAL LOW (ref 12.0–15.0)
MCH: 30.1 pg (ref 26.0–34.0)
MCHC: 33.9 g/dL (ref 30.0–36.0)
MCV: 88.9 fL (ref 78.0–100.0)
Platelets: 148 10*3/uL — ABNORMAL LOW (ref 150–400)
RBC: 3.59 MIL/uL — ABNORMAL LOW (ref 3.87–5.11)
RDW: 14.1 % (ref 11.5–15.5)
WBC: 8.7 10*3/uL (ref 4.0–10.5)

## 2014-07-02 LAB — CBC WITH DIFFERENTIAL/PLATELET
Basophils Absolute: 0 10*3/uL (ref 0.0–0.1)
Basophils Relative: 1 % (ref 0–1)
EOS PCT: 4 % (ref 0–5)
Eosinophils Absolute: 0.3 10*3/uL (ref 0.0–0.7)
HEMATOCRIT: 29.3 % — AB (ref 36.0–46.0)
HEMOGLOBIN: 9.9 g/dL — AB (ref 12.0–15.0)
LYMPHS PCT: 22 % (ref 12–46)
Lymphs Abs: 1.5 10*3/uL (ref 0.7–4.0)
MCH: 29.6 pg (ref 26.0–34.0)
MCHC: 33.8 g/dL (ref 30.0–36.0)
MCV: 87.5 fL (ref 78.0–100.0)
MONO ABS: 0.7 10*3/uL (ref 0.1–1.0)
MONOS PCT: 11 % (ref 3–12)
Neutro Abs: 4.4 10*3/uL (ref 1.7–7.7)
Neutrophils Relative %: 64 % (ref 43–77)
Platelets: 124 10*3/uL — ABNORMAL LOW (ref 150–400)
RBC: 3.35 MIL/uL — AB (ref 3.87–5.11)
RDW: 14.1 % (ref 11.5–15.5)
WBC: 6.9 10*3/uL (ref 4.0–10.5)

## 2014-07-02 MED ORDER — PANTOPRAZOLE SODIUM 40 MG IV SOLR
40.0000 mg | Freq: Two times a day (BID) | INTRAVENOUS | Status: DC
  Administered 2014-07-02 – 2014-07-06 (×9): 40 mg via INTRAVENOUS
  Filled 2014-07-02 (×9): qty 40

## 2014-07-02 NOTE — Progress Notes (Signed)
H&H is coming up. It was 10.8 and 31.9 this afternoon. Past 75 mL of old blood per rectum. She has a good appetite. She has been ambulating on the floor. Her CBC in a.m. If she does well she could go home tomorrow.

## 2014-07-02 NOTE — Progress Notes (Signed)
Patient ID: Lynn Nichols, female   DOB: July 29, 1914, 80 y.o.   MRN: [id number] [facility] PROGRESS NOTE  Lynn Nichols [id number] DOB: July 29, 1914 DOA: 06/30/2014 PCP: Reynolds BowlOMSTOCK, LLOYD, MD  Brief narrative: 80 y.o. female with past medical history of hypertension and arthritis who presented to [facility] 06/30/2014 with bleeding per rectum. Patient underwent colonoscopy 07/01/2014 with findings of large amount of blood stool and clots, multiple diverticuli in sigmoid colon but not with stigmata of bleed. Patient also underwent bleeding scan with findings of subtle activity in pelvis suggestive of rectosigmoid low level bleeding. Apparently, family does not want to have patient subjective further testing.  Assessment/Plan:   Principal problem: Acute lower gastrointestinal bleeding / acute blood loss anemia  Patient underwent colonoscopy on 07/01/2014 with findings of large amount of blood stool and clots, multiple diverticuli in sigmoid colon but not with stigmata of bleed. Based on bleeding scan done 07/01/2014 there was a subtle finding of activity in the pelvis suggestive of rectosigmoid low level bleeding.  Per GI, if patient continues to bleed she may require abdominal angiogram. Family now declines further testing.  We will obtain CBC today.  Patient is status post 2 units of PRBC transfusion.  Continue protonic 40 mg IV every 12 hours.  Appreciate GI following.  Active problems:  Essential hypertension  Continue HCTZ 25 mg by mouth twice a day, nifedipine and 90 mg daily  Hypokalemia   Perhaps due to GI losses   Being repleted Thrombocytopenia  Mild, platelets are 124. Use SCDs for DVT prophylaxis.  DVT prophylaxis: SCDs bilaterally while patient is in hospital due to risk of bleeding   Consultants:  Gastroenterology  Procedures:  Bleeding scan 07/01/2014 Colonoscopy 07/01/2014  Antibiotics:  None   Code Status:DNR/DNI Family Communication: plan of care  discussed with the patient's family at the bedside  Disposition Plan: remains inpatient; PT eval pending   Manson PasseyEVINE, Lynn Gillin, MD  [facility] Pager [phone]  If 7PM-7AM, please contact night-coverage [email or web address] Password [email or web address] 07/02/2014, 12:19 PM   LOS: 2 days   HPI/Subjective: No acute overnight events. This am had some blood per rectum.  Objective: Filed Vitals:   07/01/14 1455 07/01/14 1500 07/01/14 2019 07/02/14 0635  BP: 132/53  133/66 117/53  Pulse: 70 79 68 60  Temp:   98.6 F (37 C) 97.6 F (36.4 C)  TempSrc:   Oral Oral  Resp: 24 19 20 20   Height:      Weight:      SpO2: 100% 100% 99% 98%    Intake/Output Summary (Last 24 hours) at 07/02/14 1219 Last data filed at 07/02/14 1059  Gross per 24 hour  Intake    360 ml  Output   4500 ml  Net  -4140 ml    Exam:   General:  Pt is alert, not in acute distress  Cardiovascular: Regular rate and rhythm, S1/S2 appreciated   Respiratory: Clear to auscultation bilaterally, no wheezing  Abdomen: Soft, non tender, non distended, bowel sounds present  Extremities: pulses DP and PT palpable bilaterally  Neuro: Grossly nonfocal  Data Reviewed: Basic Metabolic Panel:  Recent Labs Lab 06/30/14 0638 07/02/14 0557  NA 141 140  K 3.1* 3.6*  CL 102 105  CO2 25 23  GLUCOSE 149* 86  BUN 15 12  CREATININE 0.86 0.72  CALCIUM 8.8 8.0*   Liver Function Tests:  Recent Labs Lab 06/30/14 0638  AST 22  ALT 10  ALKPHOS 39  BILITOT 0.3  PROT 7.4  ALBUMIN 3.6   No results found for this basename: LIPASE, AMYLASE,  in the last 168 hours No results found for this basename: AMMONIA,  in the last 168 hours CBC:  Recent Labs Lab 06/30/14 0638 06/30/14 0850  07/01/14 0511 07/01/14 0843 07/01/14 1549 07/01/14 2122 07/02/14 0557  WBC 6.5 5.2  --   --  8.5  --   --  6.9  NEUTROABS 3.7  --   --   --   --   --   --  4.4  HGB 10.1* 8.6*  < > 9.9* 10.0* 11.2* 10.6* 9.9*  HCT 30.6* 26.3*  < > 28.8* 29.4*  32.3* 30.4* 29.3*  MCV 89.2 89.5  --   --  86.7  --   --  87.5  PLT 182 147*  --   --  112*  --   --  124*  < > = values in this interval not displayed. Cardiac Enzymes: No results found for this basename: CKTOTAL, CKMB, CKMBINDEX, TROPONINI,  in the last 168 hours BNP: No components found with this basename: POCBNP,  CBG: No results found for this basename: GLUCAP,  in the last 168 hours  No results found for this or any previous visit (from the past 240 hour(s)).   Studies: Nm Gi Blood Loss 07/01/2014 IMPRESSION: Subtle findings of activity in the pelvis suggestive of rectosigmoid low level bleeding.      Scheduled Meds: . hydrochlorothiazide  25 mg Oral BID  . levobunolol  1 drop Both Eyes BID  . NIFEdipine  90 mg Oral q morning - 10a  . pantoprazole (PROTONIX) IV  40 mg Intravenous Q12H  . potassium chloride  40 mEq Oral BID

## 2014-07-02 NOTE — Progress Notes (Signed)
  Subjective: Patient has not had any bowel movements since 2 PM yesterday. According to her family members she is hungry. No abdominal pain reported. Patient wants Foley's catheter removed.    Objective: Blood pressure 117/53, pulse 60, temperature 97.6 F (36.4 C), temperature source Oral, resp. rate 20, height 5\' 5"  (1.651 m), weight 139 lb (63.05 kg), SpO2 98.00%. Patient is sleeping and therefore not examined.   Labs/studies Results:   Recent Labs  06/30/14 0850  07/01/14 0843 07/01/14 1549 07/01/14 2122 07/02/14 0557  WBC 5.2  --  8.5  --   --  6.9  HGB 8.6*  < > 10.0* 11.2* 10.6* 9.9*  HCT 26.3*  < > 29.4* 32.3* 30.4* 29.3*  PLT 147*  --  112*  --   --  124*  < > = values in this interval not displayed.  BMET   Recent Labs  06/30/14 0638 07/02/14 0557  NA 141 140  K 3.1* 3.6*  CL 102 105  CO2 25 23  GLUCOSE 149* 86  BUN 15 12  CREATININE 0.86 0.72  CALCIUM 8.8 8.0*    LFT   Recent Labs  06/30/14 0638  PROT 7.4  ALBUMIN 3.6  AST 22  ALT 10  ALKPHOS 39  BILITOT 0.3    PT/INR   Recent Labs  06/30/14 0850  LABPROT 14.6  INR 1.14      Assessment:  #1. Lower GI bleed. Patient had colonoscopy yesterday which could not be completed because she had poor prep with stool old blood and clots. She did have multiple sigmoid diverticula. GI bleeding scan reportedly positive with low level activity in the rectosigmoid area. Study reviewed with Dr. Kearney Hardover and it is negative. Intermittent activity in pelvic region appears to be due to bladder. It appears patient has stopped bleeding. Patient and her family does not want to have any more tests. Patient has received 2 units of PRBCs and H&H is stable. #2. Mild thrombocytopenia possibly secondary to acute GI bleed.   Recommendations.  DC Foley's catheter. Advance diet. H&H at 4 PM.

## 2014-07-02 NOTE — Care Management Note (Addendum)
    Page 1 of 2   07/06/2014     1:31:01 PM CARE MANAGEMENT NOTE 07/06/2014  Patient:  Lynn Nichols,Lynn Nichols   Account Number:  [id number]  Date Initiated:  07/02/2014  Documentation initiated by:  Anibal HendersonBOLDEN,GENEVA  Subjective/Objective Assessment:   Admitted with GIB. Pt lives at home with 2 daughters, and a granddaughter. She is independent at home, and will return home at D/C     Action/Plan:   She has athritis anf requests a walker to use when her les hurt, witch is often. She has a cane, but this does not help much when she is in pain. She would also like a BSC   Anticipated DC Date:  07/03/2014   Anticipated DC Plan:  HOME/SELF CARE      DC Planning Services  CM consult      PAC Choice  DURABLE MEDICAL EQUIPMENT   Choice offered to / List presented to:     DME arranged  3-N-1  [facility]an HurstWALKER - [facility]      DME agency  [facility]        Status of service:  Completed, signed off Medicare Important Message given?  YES (If response is "NO", the following Medicare IM given date fields will be blank) Date Medicare IM given:  07/04/2014 Medicare IM given by:  Arlyss QueenBLACKWELL,Trebor Galdamez C Date Additional Medicare IM given:  07/06/2014 Additional Medicare IM given by:  Sharrie RothmanAMMY C Shelvia Fojtik  Discharge Disposition:  HOME/SELF CARE  Per UR Regulation:  Reviewed for med. necessity/level of care/duration of stay  If discussed at Long Length of Stay Meetings, dates discussed:   07/05/2014    Comments:  07/06/14 1330 Arlyss Queenammy Daly Whipkey, RN BSN CM Pt to be discharged home today. No CM needs noted.  07/04/14 1510 Arlyss Queenammy Yousra Ivens, RN BSN CM Pt potential discharge today. Pt BSC [facility] rolling [facility] No HH needs noted. No other CM needs noted.  07/02/14 1600 Anibal HendersonGeneva Bolden RN/CM

## 2014-07-02 NOTE — Clinical Documentation Improvement (Signed)
07/02/14 Please clarify anemia. Thank you.  Possible Clinical Conditions?    Expected Acute Blood Loss Anemia  Acute Blood Loss Anemia  Acute on chronic blood loss anemia  Chronic blood loss anemia  Other Condition________________  Cannot Clinically Determine  Supporting Information:    Risk Factors:  Admitted with GI bleed Rectal bleeding GI consult for rectal bleed and anemia Anemia secondary to acute GI bleed  Signs and Symptoms: Rectal bleeding Blood clots in stool  Diagnostics: H&H  8/1@0850  = 8.6/26.3; @1710  = 7.4/2.8 8/2@0511 = 9.9/28.8; @0843 = 10/29.4; @1549 = 11.2/32.3; @222 =10.6/30.4 8/3 = 9.9/29.3  Treatments: Transfusion: 2 units PRBC Serial H&H monitoring   Thank You, Harless Littenebora T Ramses Klecka ,RN Clinical Documentation Specialist:  [phone]  [facility]- Health Information Management

## 2014-07-02 NOTE — Evaluation (Signed)
Physical Therapy Evaluation Patient Details Name: Lynn Nichols MRN: [id number] DOB: 09-10-1914 Today's Date: 07/02/2014   History of Present Illness  80 y.o. female with past medical history of hypertension and arthritis who presented to [facility] 06/30/2014 with bleeding per rectum. Patient underwent colonoscopy 07/01/2014 with findings of large amount of blood stool and clots, multiple diverticuli in sigmoid colon but not with stigmata of bleed. Patient also underwent bleeding scan with findings of subtle activity in pelvis suggestive of rectosigmoid low level bleeding. Apparently, family does not want to have patient subjective further testing.  Clinical Impression  Pt is a 80 year old female who presents to physical therapy with dx of acute lower GI bleeding.  Pt very cooperative with family, and reports mod (I) with functional mobility skills prior to hospitalization.  During evaluation, pt was mod (I) with bed mobility and transfer skills.  Gait assessed completed with personal cane, with noted decreased cadence and step to gait patterning.  Gait re-assessed with RW for stability, with notable improvements in cadence and step through gait patterning, though pt did have decreased step length (B).  Pt appears at baseline for functional mobility skills and will be discharged from acute PT services.  Pt would benefit from HHPT to ensure safety with functional mobility skills with new DME device of RW, though no overt balance issues were noted.  Recommend use of RW for gait for safety due to improved gait patterning with RW vs. Std cane.     Follow Up Recommendations Home health PT    Equipment Recommendations  Rolling walker with 5" wheels       Precautions / Restrictions Precautions Precautions: Fall Restrictions Weight Bearing Restrictions: No      Mobility  Bed Mobility Overal bed mobility: Modified Independent                Transfers Overall transfer level: Modified  independent                  Ambulation/Gait Ambulation/Gait assistance: Min guard Ambulation Distance (Feet): 60 Feet Assistive device: Straight cane;Rolling walker (2 wheeled) Gait Pattern/deviations: Step-through pattern;Decreased step length - right;Decreased step length - left;Trunk flexed;Step-to pattern (Step to pattern with cane, step through (with decreased step length) with RW)   Gait velocity interpretation: Below normal speed for age/gender General Gait Details: 60 feet x2.  Gait assessed with personal std cane, then transitioned to RW for increased stability/balance. Noted improved gait cadence and patterning with RW.       Balance Overall balance assessment: Needs assistance Sitting-balance support: Feet supported;No upper extremity supported Sitting balance-Leahy Scale: Good     Standing balance support: Bilateral upper extremity supported;During functional activity Standing balance-Leahy Scale: Fair Standing balance comment: Assessed gait with std cane and RW, with improved stability/balance with RW                             Pertinent Vitals/Pain No pain reported.     Home Living Family/patient expects to be discharged to:: Private residence Living Arrangements: Children Available Help at Discharge: Family;Available 24 hours/day Type of Home: House Home Access: Stairs to enter Entrance Stairs-Rails: Can reach both Entrance Stairs-Number of Steps: 4 Home Layout: Laundry or work area in basement;Able to live on main level with bedroom/bathroom Home Equipment: [employer] - single point      Prior Function Level of Independence: Independent with assistive device(s)  Comments: Pt is mod (I) with bed mobility skills, transfers, and household amb with use of std cane.  Pt reports she sponge baths.       Hand Dominance   Dominant Hand: Right    Extremity/Trunk Assessment               Lower Extremity Assessment:  Generalized weakness         Communication   Communication: No difficulties  Cognition Arousal/Alertness: Awake/alert Behavior During Therapy: WFL for tasks assessed/performed Overall Cognitive Status: Within Functional Limits for tasks assessed                        Assessment/Plan    PT Assessment All further PT needs can be met in the next venue of care  PT Diagnosis Generalized weakness;Difficulty walking   PT Problem List Decreased strength;Decreased activity tolerance;Decreased knowledge of use of DME;Decreased mobility  PT Treatment Interventions     PT Goals (Current goals can be found in the Care Plan section) Acute Rehab PT Goals PT Goal Formulation: No goals set, d/c therapy     End of Session Equipment Utilized During Treatment: Gait belt Activity Tolerance: Patient tolerated treatment well Patient left: in bed;with call bell/phone within reach;with family/visitor present           Time: 1194-1740 PT Time Calculation (min): 20 min   Charges:   PT Evaluation $Initial PT Evaluation Tier I: 1 Procedure          [name] 07/02/2014, 4:19 PM

## 2014-07-03 LAB — CBC
HCT: 29.3 % — ABNORMAL LOW (ref 36.0–46.0)
HEMOGLOBIN: 9.9 g/dL — AB (ref 12.0–15.0)
MCH: 29.8 pg (ref 26.0–34.0)
MCHC: 33.8 g/dL (ref 30.0–36.0)
MCV: 88.3 fL (ref 78.0–100.0)
PLATELETS: 144 10*3/uL — AB (ref 150–400)
RBC: 3.32 MIL/uL — ABNORMAL LOW (ref 3.87–5.11)
RDW: 13.9 % (ref 11.5–15.5)
WBC: 6.2 10*3/uL (ref 4.0–10.5)

## 2014-07-03 NOTE — Progress Notes (Signed)
[facility] PROGRESS NOTE  Lynn Nichols ZOX:[id number] DOB: 06-01-1914 DOA: 06/30/2014 PCP: Reynolds Bowl, MD  Assessment/Plan: 80 y.o. female with past medical history of hypertension and arthritis who presented to [facility] 06/30/2014 with bleeding per rectum. Patient underwent colonoscopy 07/01/2014 with findings of large amount of blood stool and clots, multiple diverticuli in sigmoid colon but not with stigmata of bleed. Patient also underwent bleeding scan with findings of subtle activity in pelvis suggestive of rectosigmoid low level bleeding. Apparently, family does not want to have patient subjective further testing.   1. Acute lower gastrointestinal bleeding / acute blood loss anemia  Patient underwent colonoscopy on 07/01/2014 with findings of large amount of blood stool and clots, multiple diverticuli in sigmoid colon but not with stigmata of bleed. Based on bleeding scan done 07/01/2014 there was a subtle finding of activity in the pelvis suggestive of rectosigmoid low level bleeding.  Patient is status post 2 units of PRBC transfusion.  Family declines further testing,procedures; Continue protonic 40 mg IV every 12 hours.  Had some blodo in stool on 8/3; monitor over night , Hg; TF prn   2. Essential hypertension  Continue HCTZ 25 mg by mouth twice a day, nifedipine and 90 mg daily  3. Hypokalemia  Perhaps due to GI losses  Being repleted 4. Thrombocytopenia  Mild, platelets are 124. Use SCDs for DVT prophylaxis.    DVT prophylaxis: SCDs bilaterally while patient is in hospital due to risk of bleeding   Code Status: DNR Family Communication: d/w patient, her family at the bedside (indicate person spoken with, relationship, and if by phone, the number) Disposition Plan: home 24-+48 hrs   Consultants:  Gastroenterology  Procedures:  Bleeding scan 07/01/2014  Colonoscopy 07/01/2014  Antibiotics:  None  HPI/Subjective: alert  Objective: Filed Vitals:   07/03/14 0900  BP: 108/61  Pulse:   Temp:   Resp:     Intake/Output Summary (Last 24 hours) at 07/03/14 1059 Last data filed at 07/03/14 0916  Gross per 24 hour  Intake    240 ml  Output      0 ml  Net    240 ml   Filed Weights   06/30/14 0639  Weight: 63.05 kg (139 lb)    Exam:   General:  alert  Cardiovascular: s1,s2 rrr  Respiratory: CTA BL  Abdomen: soft, nt,nd   Musculoskeletal: no LE edema   Data Reviewed: Basic Metabolic Panel:  Recent Labs Lab 06/30/14 0638 07/02/14 0557  NA 141 140  K 3.1* 3.6*  CL 102 105  CO2 25 23  GLUCOSE 149* 86  BUN 15 12  CREATININE 0.86 0.72  CALCIUM 8.8 8.0*   Liver Function Tests:  Recent Labs Lab 06/30/14 0638  AST 22  ALT 10  ALKPHOS 39  BILITOT 0.3  PROT 7.4  ALBUMIN 3.6   No results found for this basename: LIPASE, AMYLASE,  in the last 168 hours No results found for this basename: AMMONIA,  in the last 168 hours CBC:  Recent Labs Lab 06/30/14 0638 06/30/14 0850  07/01/14 0843 07/01/14 1549 07/01/14 2122 07/02/14 0557 07/02/14 1318 07/03/14 0603  WBC 6.5 5.2  --  8.5  --   --  6.9 8.7 6.2  NEUTROABS 3.7  --   --   --   --   --  4.4  --   --   HGB 10.1* 8.6*  < > 10.0* 11.2* 10.6* 9.9* 10.8* 9.9*  HCT 30.6* 26.3*  < >  29.4* 32.3* 30.4* 29.3* 31.9* 29.3*  MCV 89.2 89.5  --  86.7  --   --  87.5 88.9 88.3  PLT 182 147*  --  112*  --   --  124* 148* 144*  < > = values in this interval not displayed. Cardiac Enzymes: No results found for this basename: CKTOTAL, CKMB, CKMBINDEX, TROPONINI,  in the last 168 hours BNP (last 3 results) No results found for this basename: PROBNP,  in the last 8760 hours CBG: No results found for this basename: GLUCAP,  in the last 168 hours  No results found for this or any previous visit (from the past 240 hour(s)).   Studies: Nm Gi Blood Loss  07/01/2014   CLINICAL DATA:  Lower GI bleed  EXAM: NUCLEAR MEDICINE GASTROINTESTINAL BLEEDING SCAN  TECHNIQUE: Sequential  abdominal images were obtained following intravenous administration of Tc-6444m labeled red blood cells.  RADIOPHARMACEUTICALS:  Twenty-seven mCi Tc-3444m in-vitro labeled red cells.  COMPARISON:  None.  FINDINGS: Satisfactory red blood cell tag.  Mild activity accumulation is present in the midline above the bladder and separate from the bladder. This is seen on early images and is intermittent. Study was continued for 2 hr. The patient refused further imaging. Images between 1hr and 2 hr continue to show slight activity above the bladder. This is suspicious for a low level rectosigmoid bleed.  IMPRESSION: Subtle findings of activity in the pelvis suggestive of rectosigmoid low level bleeding.   Electronically Signed   By: Marlan Palauharles  Clark M.D.   On: 07/01/2014 20:12    Scheduled Meds: . sodium chloride   Intravenous Once  . hydrochlorothiazide  25 mg Oral BID  . levobunolol  1 drop Both Eyes BID  . NIFEdipine  90 mg Oral q morning - 10a  . pantoprazole (PROTONIX) IV  40 mg Intravenous Q12H  . potassium chloride  40 mEq Oral BID   Continuous Infusions:   Active Problems:   Acute lower gastrointestinal bleeding   Essential hypertension   Acute lower GI bleeding    Time spent: >35 minutes     Esperanza SheetsBURIEV, Megham Dwyer N  [facility] Pager [phone]. If 7PM-7AM, please contact night-coverage at [email or web address], password [email or web address] 07/03/2014, 10:59 AM  LOS: 3 days

## 2014-07-03 NOTE — Progress Notes (Signed)
  Subjective:  Patient has no complaints. She passed some blood tonight but not since daylight. She has good appetite. She experienced transient hypogastric pain this morning but none since then. Patient and her proper timing no wants her to undergo repeat colonoscopy.    Objective: Blood pressure 130/55, pulse 59, temperature 97.8 F (36.6 C), temperature source Oral, resp. rate 20, height 5\' 5"  (1.651 m), weight 139 lb (63.05 kg), SpO2 99.00%. Patient is alert and in no acute distress.  Abdomen is soft and nontender without organomegaly or masses.  No LE edema or clubbing noted.  Labs/studies Results:   Recent Labs  07/02/14 0557 07/02/14 1318 07/03/14 0603  WBC 6.9 8.7 6.2  HGB 9.9* 10.8* 9.9*  HCT 29.3* 31.9* 29.3*  PLT 124* 148* 144*    BMET   Recent Labs  07/02/14 0557  NA 140  K 3.6*  CL 105  CO2 23  GLUCOSE 86  BUN 12  CREATININE 0.72  CALCIUM 8.0*     Assessment:  Lower GI bleed possibly second to colonic diverticulosis. Colonoscopy was attempted 2 days ago but could not be completed because of poor prep and old blood and clots in the colon. She did have multiple diverticula. Subsequent GI bleeding. scan was negative. H&H has dropped some but no evidence of active bleed.  Recommendations;  Will plan outpatient colonoscopy possibly next week. We'll continue to hold aspirin for now.

## 2014-07-04 LAB — CBC
HCT: 30 % — ABNORMAL LOW (ref 36.0–46.0)
Hemoglobin: 9.9 g/dL — ABNORMAL LOW (ref 12.0–15.0)
MCH: 29.7 pg (ref 26.0–34.0)
MCHC: 33 g/dL (ref 30.0–36.0)
MCV: 90.1 fL (ref 78.0–100.0)
PLATELETS: 160 10*3/uL (ref 150–400)
RBC: 3.33 MIL/uL — ABNORMAL LOW (ref 3.87–5.11)
RDW: 14 % (ref 11.5–15.5)
WBC: 6 10*3/uL (ref 4.0–10.5)

## 2014-07-04 MED ORDER — BISACODYL 5 MG PO TBEC
10.0000 mg | DELAYED_RELEASE_TABLET | Freq: Once | ORAL | Status: AC
  Administered 2014-07-04: 10 mg via ORAL
  Filled 2014-07-04: qty 2

## 2014-07-04 MED ORDER — BISACODYL 10 MG RE SUPP
10.0000 mg | Freq: Once | RECTAL | Status: AC
  Administered 2014-07-04: 10 mg via RECTAL
  Filled 2014-07-04: qty 1

## 2014-07-04 MED ORDER — PEG 3350-KCL-NA BICARB-NACL 420 G PO SOLR
4000.0000 mL | Freq: Once | ORAL | Status: AC
  Administered 2014-07-04: 4000 mL via ORAL
  Filled 2014-07-04: qty 4000

## 2014-07-04 NOTE — Progress Notes (Signed)
Patient ID: Donnetta Hailnnie H Ulysse, female   DOB: [birth year], 80 y.o.   MRN: [id number] Patient may be discharged from our stand point. Will schedule a colonoscopy next week. Family in room this am stating if colonoscopy not done before discharge, then patient will not have one after discharge. I have paged Dr. Irene LimboGoodrich. I also talked with Mardella LaymanLindsey, RN for patient. She is going to get in touch with Dr. Irene LimboGoodrich. He is to call Dr. Karilyn Cotaehman.

## 2014-07-04 NOTE — Progress Notes (Signed)
Notified Dr. Karilyn Cotaehman that patient stated she wanted to have the colonoscopy tomorrow.  Will follow MD orders and instructed patient that she must drink all of the GoLytely.

## 2014-07-04 NOTE — Progress Notes (Signed)
  PROGRESS NOTE  Donnetta Hailnnie H Searight WUJ:[id number] DOB: 10-17-1914 DOA: 06/30/2014 PCP: [facility]  Summary: 80 year old woman very functional at baseline, takes low-dose aspirin but not anticoagulants, presented with painless lower GI bleed and acute blood loss anemia. She was seen in consultation with gastroenterology, colonoscopy was nondiagnostic secondary to large amount of stool, blood in clots. Multiple sigmoid diverticula were seen. Nuclear medicine bleeding scan with subtle activity in the pelvis suggestive of rectosigmoid low level bleeding.  Assessment/Plan: 1. Acute lower GI bleed, possibly secondary to diverticulosis. Colonoscopy was nondiagnostic, nuclear medicine bleeding scan suggested the possibility of rectosigmoid level bleeding, low level. Patient has vacillated on colonoscopy but now her and family want to proceed. 2. Acute blood loss anemia. Appears stable at this point. 3. Essential hypertension. Stable. 4. Thrombocytopenia. Likely secondary to GI blood loss.   Continue to hold aspirin. Progression carotid plan for colonoscopy in the morning.  Code Status: DNR DVT prophylaxis: SCDs Family Communication: son, granddaugther Disposition Plan: home  Brendia Sacksaniel Goodrich, MD  [facility]  Pager [phone] If 7PM-7AM, please contact night-coverage at [email or web address], password [email or web address] 07/04/2014, 11:44 AM  LOS: 4 days   Consultants:  Gastroenterology  Physical therapy: Home health PT, rolling walker  Procedures:  Transfusion 2 units packed red blood cells  8/2 colonoscopy could not be completed because of large amount of blood, stool and clots multiple diverticular and sigmoid colon without evidence of stigmata of bleed.  Antibiotics:    HPI/Subjective: Documentation noted. I returned call to nurses' station but Ms. Setzer had left. She left no contact information or return number and secretary has no contact information for  her.  Patient has no complaints at this point suffer mild lower abdominal pain.  Objective: Filed Vitals:   07/03/14 1442 07/03/14 2248 07/04/14 0609 07/04/14 0703  BP: 130/55 98/33 124/48   Pulse: 59 56 58   Temp: 97.8 F (36.6 C) 98.4 F (36.9 C) 98.4 F (36.9 C)   TempSrc: Oral Oral Oral   Resp: 20 20 20    Height:      Weight:      SpO2: 99% 99% 96% 96%    Intake/Output Summary (Last 24 hours) at 07/04/14 1144 Last data filed at 07/04/14 0113  Gross per 24 hour  Intake    480 ml  Output    200 ml  Net    280 ml     Filed Weights   06/30/14 0639  Weight: 63.05 kg (139 lb)    Exam:     Afebrile, vital signs stable. No hypoxia. Gen. Appears calm and comfortable.  Psych. Alert. Speech fluent and clear.  Cardiovascular. Regular rate and rhythm. No murmur, rub or gallop. No lower extremity edema.  Respiratory. Clear to auscultation bilaterally. No wheezes, rales or rhonchi. Normal respiratory effort.  Data Reviewed:  Heme: Hemoglobin is stable 9.9. Platelet count normal.  Scheduled Meds: . sodium chloride   Intravenous Once  . hydrochlorothiazide  25 mg Oral BID  . levobunolol  1 drop Both Eyes BID  . NIFEdipine  90 mg Oral q morning - 10a  . pantoprazole (PROTONIX) IV  40 mg Intravenous Q12H  . potassium chloride  40 mEq Oral BID   Continuous Infusions:   Active Problems:   Acute lower gastrointestinal bleeding   Essential hypertension   Acute lower GI bleeding   Time spent 20 minutes

## 2014-07-05 ENCOUNTER — Encounter (HOSPITAL_COMMUNITY): Payer: Self-pay

## 2014-07-05 ENCOUNTER — Encounter (HOSPITAL_COMMUNITY)
Admission: EM | Disposition: A | Discharge status: Home Health | Payer: Self-pay | Source: Home / Self Care | Attending: Family Medicine

## 2014-07-05 DIAGNOSIS — D62 Acute posthemorrhagic anemia: Secondary | ICD-10-CM

## 2014-07-05 DIAGNOSIS — K633 Ulcer of intestine: Secondary | ICD-10-CM

## 2014-07-05 HISTORY — PX: COLONOSCOPY: SHX5424

## 2014-07-05 LAB — CBC
HCT: 28.4 % — ABNORMAL LOW (ref 36.0–46.0)
HEMOGLOBIN: 9.5 g/dL — AB (ref 12.0–15.0)
MCH: 30.2 pg (ref 26.0–34.0)
MCHC: 33.5 g/dL (ref 30.0–36.0)
MCV: 90.2 fL (ref 78.0–100.0)
PLATELETS: 187 10*3/uL (ref 150–400)
RBC: 3.15 MIL/uL — ABNORMAL LOW (ref 3.87–5.11)
RDW: 14 % (ref 11.5–15.5)
WBC: 5.6 10*3/uL (ref 4.0–10.5)

## 2014-07-05 SURGERY — COLONOSCOPY
Anesthesia: Moderate Sedation

## 2014-07-05 MED ORDER — MAGNESIUM CITRATE PO SOLN
0.5000 | Freq: Once | ORAL | Status: AC
  Administered 2014-07-05: 0.5 via ORAL
  Filled 2014-07-05: qty 296

## 2014-07-05 MED ORDER — STERILE WATER FOR IRRIGATION IR SOLN
Status: DC | PRN
  Administered 2014-07-05: 16:00:00

## 2014-07-05 MED ORDER — MIDAZOLAM HCL 5 MG/5ML IJ SOLN
INTRAMUSCULAR | Status: DC | PRN
  Administered 2014-07-05: 0.5 mg via INTRAVENOUS
  Administered 2014-07-05: 1.5 mg via INTRAVENOUS

## 2014-07-05 MED ORDER — SODIUM CHLORIDE 0.9 % IV SOLN: INTRAVENOUS | Status: DC

## 2014-07-05 MED ORDER — MEPERIDINE HCL 50 MG/ML IJ SOLN
INTRAMUSCULAR | Status: AC
  Filled 2014-07-05: qty 1

## 2014-07-05 MED ORDER — MIDAZOLAM HCL 5 MG/5ML IJ SOLN
INTRAMUSCULAR | Status: AC
  Filled 2014-07-05: qty 10

## 2014-07-05 MED ORDER — SODIUM CHLORIDE 0.9 % IV SOLN
INTRAVENOUS | Status: DC
  Administered 2014-07-05: 12:00:00 via INTRAVENOUS

## 2014-07-05 NOTE — Op Note (Signed)
COLONOSCOPY PROCEDURE REPORT  PATIENT:  Lynn Nichols  MR#:  [id number] Birthdate:  12-07-1913, 80 y.o., female Endoscopist:  Dr. Malissa HippoNajeeb U. Rehman, MD Referred By:  Dr. Marinda ElkAbraham Feliz Ortiz, MD  Procedure Date: 07/05/2014  Procedure:   Colonoscopy  Indications: Patient is 80 year old female present with lateral hematochezia over the weekend. She had called in for possible blood in stool in the examination was abandoned. After initial reluctance patient and her family want her to be reevaluated with colonoscopy. Patient has received 2 units of PRBCs and has stopped bleeding.  Informed Consent:  The procedure and risks were reviewed with the patient and informed consent was obtained.  Medications:  Versed 2 mg IV  Description of procedure:  After a digital rectal exam was performed, that colonoscope was advanced from the anus through the rectum and colon to the area of the cecum, ileocecal valve and appendiceal orifice. The cecum was deeply intubated. These structures were well-seen and photographed for the record. From the level of the cecum and ileocecal valve, the scope was slowly and cautiously withdrawn. The mucosal surfaces were carefully surveyed utilizing scope tip to flexion to facilitate fold flattening as needed. The scope was pulled down into the rectum where a thorough exam including retroflexion was performed.  Findings:   Prep excellent. Scattered diverticula throughout the colon none with stigmata of bleed. Elliptical ulcer noted at the rectosigmoid junction about 25 mm in length and 10 mm in maximal width. Biopsy taken for routine histology. Normal rectal mucosa and anorectal junction.   Therapeutic/Diagnostic Maneuvers Performed:  See above  Complications:  None  Cecal Withdrawal Time:  6 minutes  Impression:  Examination performed to cecum. Pancolonic diverticulosis. Single 25 x 10 mm ulcerated rectosigmoid junction without stigmata of bleed. Biopsy  taken.  Comment; She possibly bled from this ulcer. Endoscopically this ulcer appears benign.   Recommendations:  Advance diet. Continue to hold aspirin.  REHMAN,NAJEEB U  07/05/2014 4:34 PM  CC: Dr. Theodoro DoingInc [facility] & Dr. Bonnetta BarryNo ref. provider found

## 2014-07-05 NOTE — Progress Notes (Signed)
NUTRITION FOLLOW UP  Intervention:   -RD will follow for diet advancement.   Nutrition Dx:   Inadequate oral intake related to limited food and beverage choices as evidenced by clear liquids diet.   Goal:   Pt to meet >/= 90% of their estimated nutrition needs  Monitor:   PO intake, labs, weight changes, I/O's  Assessment:   Pt NPO for repeat colonoscopy today. Pt has received blood transfusions due to low H/H.  Resource Breeze suppleent has been d/c. Pt has had good appetite during hospitalization. Noted 50-100% of meals completion. No new weight available since last RD visit.  Labs reviewed. K low, but improved (being repleted). Glucose WNL.  Plan is for potential d/c after colonoscopy today.   Height: Ht Readings from Last 1 Encounters:  06/30/14 5\' 5"  (1.651 m)    Weight Status:   Wt Readings from Last 1 Encounters:  06/30/14 139 lb (63.05 kg)    Re-estimated needs:  Kcal: 1300-1500  Protein: 70-80 gr  Fluid: >1300 ml daily  Skin: Intact  Diet Order: NPO   Intake/Output Summary (Last 24 hours) at 07/05/14 1229 Last data filed at 07/05/14 0910  Gross per 24 hour  Intake    480 ml  Output    200 ml  Net    280 ml    Last BM: 07/05/14   Labs:   Recent Labs Lab 06/30/14 0638 07/02/14 0557  NA 141 140  K 3.1* 3.6*  CL 102 105  CO2 25 23  BUN 15 12  CREATININE 0.86 0.72  CALCIUM 8.8 8.0*  GLUCOSE 149* 86    CBG (last 3)  No results found for this basename: GLUCAP,  in the last 72 hours  Scheduled Meds: . sodium chloride   Intravenous Once  . hydrochlorothiazide  25 mg Oral BID  . levobunolol  1 drop Both Eyes BID  . NIFEdipine  90 mg Oral q morning - 10a  . pantoprazole (PROTONIX) IV  40 mg Intravenous Q12H  . potassium chloride  40 mEq Oral BID    Continuous Infusions: . sodium chloride    . sodium chloride    . sodium chloride 50 mL/hr at 07/05/14 1150    Trevino Wyatt A. Mayford KnifeWilliams, RD, LDN Pager: [phone]

## 2014-07-05 NOTE — Progress Notes (Signed)
  PROGRESS NOTE  Lynn Nichols ZOX:[id number]:[id number] DOB: 09-Jun-1914 DOA: 06/30/2014 PCP: [facility]  Summary: 80 year old woman very functional at baseline, takes low-dose aspirin but not anticoagulants, presented with painless lower GI bleed and acute blood loss anemia. She was seen in consultation with gastroenterology, colonoscopy was nondiagnostic secondary to large amount of stool, blood in clots. Multiple sigmoid diverticula were seen. Nuclear medicine bleeding scan with subtle activity in the pelvis suggestive of rectosigmoid low level bleeding.  Assessment/Plan: 1. Acute lower GI bleed, possibly secondary to diverticulosis. Initial colonoscopy was nondiagnostic, nuclear medicine bleeding scan suggested the possibility of rectosigmoid level bleeding, low level. Patient had vacillated on colonoscopy but subsequently agreed. 2. Acute blood loss anemia. Stable currently. 3. Essential hypertension. Remained stable. 4. Thrombocytopenia. Likely secondary to GI blood loss. Resolved.   Continue to hold aspirin.   Followup colonoscopy today.  Anticipate discharge in 24 hours  Code Status: DNR DVT prophylaxis: SCDs Family Communication: Multiple family at bedside Disposition Plan: home  Brendia Sacksaniel Huy Majid, MD  [facility]  Pager [phone] If 7PM-7AM, please contact night-coverage at [email or web address], password [email or web address] 07/05/2014, 3:24 PM  LOS: 5 days   Consultants:  Gastroenterology  Physical therapy: Home health PT, rolling walker  Procedures:  Transfusion 2 units packed red blood cells  8/2 colonoscopy could not be completed because of large amount of blood, stool and clots multiple diverticular and sigmoid colon without evidence of stigmata of bleed.  Antibiotics:    HPI/Subjective: No complaints today. No pain.  Objective: Filed Vitals:   07/04/14 2302 07/05/14 0820 07/05/14 0853 07/05/14 1411  BP: 102/57  124/62 115/50  Pulse: 73  66 63    Temp: 97.6 F (36.4 C)   98 F (36.7 C)  TempSrc: Oral   Oral  Resp: 20   20  Height:      Weight:      SpO2: 97% 97%  98%    Intake/Output Summary (Last 24 hours) at 07/05/14 1524 Last data filed at 07/05/14 1300  Gross per 24 hour  Intake    600 ml  Output    300 ml  Net    300 ml     Filed Weights   06/30/14 0639  Weight: 63.05 kg (139 lb)    Exam:     Afebrile, VSS. No hypoxia. Gen. Appears calm and comfortable, sitting. Psych. Alert. Speech fluent and clear. Cardiovascular. Regular rate and rhythm. No murmur, rub or gallop. No lower extremity edema. Respiratory. Clear to auscultation bilaterally. No wheezes, rales or rhonchi. Normal respiratory effort.  Data Reviewed: Heme: Hemoglobin stable 9.5.  Scheduled Meds: . sodium chloride   Intravenous Once  . hydrochlorothiazide  25 mg Oral BID  . levobunolol  1 drop Both Eyes BID  . NIFEdipine  90 mg Oral q morning - 10a  . pantoprazole (PROTONIX) IV  40 mg Intravenous Q12H  . potassium chloride  40 mEq Oral BID   Continuous Infusions: . sodium chloride    . sodium chloride    . sodium chloride 50 mL/hr at 07/05/14 1150    Active Problems:   Acute lower gastrointestinal bleeding   Essential hypertension   Acute lower GI bleeding   Time spent 15 minutes

## 2014-07-05 NOTE — Progress Notes (Signed)
Patient taken down for colonoscopy.

## 2014-07-05 NOTE — Progress Notes (Signed)
Patient returned from endoscopy. Alert and oriented. No complaints voiced at this time. Family at bedside.

## 2014-07-06 ENCOUNTER — Encounter (HOSPITAL_COMMUNITY): Payer: Self-pay | Admitting: Internal Medicine

## 2014-07-06 LAB — CBC
HCT: 29.8 % — ABNORMAL LOW (ref 36.0–46.0)
Hemoglobin: 9.7 g/dL — ABNORMAL LOW (ref 12.0–15.0)
MCH: 29.5 pg (ref 26.0–34.0)
MCHC: 32.6 g/dL (ref 30.0–36.0)
MCV: 90.6 fL (ref 78.0–100.0)
Platelets: 191 10*3/uL (ref 150–400)
RBC: 3.29 MIL/uL — AB (ref 3.87–5.11)
RDW: 14.2 % (ref 11.5–15.5)
WBC: 5.4 10*3/uL (ref 4.0–10.5)

## 2014-07-06 NOTE — Progress Notes (Signed)
  PROGRESS NOTE  Lynn Nichols ZOX:[id number] DOB: 1914-09-02 DOA: 06/30/2014 PCP: [facility]  Summary: 80 year old woman very functional at baseline, takes low-dose aspirin but not anticoagulants, presented with painless lower GI bleed and acute blood loss anemia. She was seen in consultation with gastroenterology, colonoscopy was nondiagnostic secondary to large amount of stool, blood in clots. Multiple sigmoid diverticula were seen. Nuclear medicine bleeding scan with subtle activity in the pelvis suggestive of rectosigmoid low level bleeding.  Assessment/Plan: 1. Acute lower GI bleed, possibly secondary to diverticulosis. Initial colonoscopy was nondiagnostic, nuclear medicine bleeding scan suggested the possibility of rectosigmoid level bleeding, low level. Patient had vacillated on colonoscopy but subsequently agreed. Second colonoscopy revealed pancolonic diverticulosis and single ulcerated rectosigmoid junction without stigmata of bleed. May have been the culprit. 2. Acute blood loss anemia. Stable. 3. Essential hypertension. Stable. 4. Thrombocytopenia. Resolved. Likely secondary to GI blood loss.    Continue to hold aspirin.   Home today. Followup biopsy results with gastroenterology.  Discussed with family at bedside  Brendia Sacksaniel Goodrich, MD  [facility]  Pager [phone] If 7PM-7AM, please contact night-coverage at [email or web address], password [email or web address] 07/06/2014, 11:24 AM  LOS: 6 days   Consultants:  Gastroenterology  Physical therapy: Home health PT, rolling walker  Procedures:  Transfusion 2 units packed red blood cells  8/2 colonoscopy could not be completed because of large amount of blood, stool and clots multiple diverticular and sigmoid colon without evidence of stigmata of bleed.  8/6 Impression:  Examination performed to cecum.  Pancolonic diverticulosis.  Single 25 x 10 mm ulcerated rectosigmoid junction without stigmata of bleed.  Biopsy taken.  Comment;  She possibly bled from this ulcer. Endoscopically this ulcer appears benign.   Antibiotics:    HPI/Subjective: No issues overnight. No pain. No bleeding. Ready to go home.  Objective: Filed Vitals:   07/05/14 1620 07/05/14 1625 07/05/14 2128 07/06/14 0502  BP: 123/60  100/42 121/52  Pulse: 88 77 64 56  Temp:   98.9 F (37.2 C) 98.8 F (37.1 C)  TempSrc:   Oral Oral  Resp: 24 21 20 21   Height:      Weight:      SpO2: 99% 100% 94% 99%    Intake/Output Summary (Last 24 hours) at 07/06/14 1124 Last data filed at 07/06/14 0900  Gross per 24 hour  Intake    900 ml  Output      0 ml  Net    900 ml     Filed Weights   06/30/14 0639  Weight: 63.05 kg (139 lb)    Exam:     Afebrile, Vitals stable. Gen. Appears calm and comfortable. Psych. Alert. Speech fluent and clear. Cardiovascular. Regular rate and rhythm. No murmur, rub or gallop. Respiratory. Clear to auscultation bilaterally. No wheezes, rales or rhonchi. Normal respiratory effort. Abdomen. Soft.  Data Reviewed: Heme: Hemoglobin stable 9.7  Scheduled Meds: . sodium chloride   Intravenous Once  . hydrochlorothiazide  25 mg Oral BID  . levobunolol  1 drop Both Eyes BID  . NIFEdipine  90 mg Oral q morning - 10a  . pantoprazole (PROTONIX) IV  40 mg Intravenous Q12H  . potassium chloride  40 mEq Oral BID   Continuous Infusions:    Active Problems:   Acute lower gastrointestinal bleeding   Essential hypertension   Acute lower GI bleeding   Acute blood loss anemia

## 2014-07-06 NOTE — Plan of Care (Signed)
Pt and family stated she was ready to be DC and she has no pain.  Pt's IV removed.  Pt educated on s/sx of further bleeding and when to call doctor or return to hospital.  Pt will be wheeled to car by PCT and family when ready.

## 2014-07-06 NOTE — Discharge Summary (Signed)
Physician Discharge Summary  BRITTNEY MUCHA ZOX:[id number] DOB: 1914/01/20 DOA: 06/30/2014  PCP: [facility]  Admit date: 06/30/2014 Discharge date: 07/06/2014  Recommendations for Outpatient Follow-up:  1. Biopsy of colon 2. Acute blood loss anemia. Consider CBC as an outpatient as clinically indicated. 3. Hold aspirin for now   Follow-up Information   Follow up with [facility]. Schedule an appointment as soon as possible for a visit in 1 week.   Contact information:   [address] [place] [address] [phone]       Follow up with REHMAN,NAJEEB U, MD. Schedule an appointment as soon as possible for a visit in 4 weeks.   Specialty:  Gastroenterology   Contact information:   [address], [address] [place] [address] [phone]      Discharge Diagnoses:  1. Acute lower GI bleed 2. Ulceration at rectosigmoid junction of unclear significance 3. Acute blood loss anemia 4. Essential hypertension  Discharge Condition: Improved Disposition: Home  Diet recommendation: Heart healthy  Filed Weights   06/30/14 0639  Weight: 63.05 kg (139 lb)    History of present illness:  80 year old woman very functional at baseline, takes low-dose aspirin but not anticoagulants, presented with painless lower GI bleed and acute blood loss anemia.   Hospital Course:  She was transfused blood and seen in consultation with gastroenterology, colonoscopy was nondiagnostic secondary to large amount of stool, blood in clots. Multiple sigmoid diverticula were seen. Nuclear medicine bleeding scan with subtle activity in the pelvis suggestive of rectosigmoid low level bleeding. Second colonoscopy revealed pancolonic diverticulosis and single ulcerated rectosigmoid junction without stigmata of bleed. May have been the culprit. She said no further bleeding, hemoglobin remained stable and plans for discharge today, discussed with gastroenterology.  Outpatient followup will be arranged by Dr. Karilyn Cota.  1. Acute lower GI bleed, possibly secondary to diverticulosis. Initial colonoscopy was nondiagnostic, nuclear medicine bleeding scan suggested the possibility of rectosigmoid level bleeding, low level. Patient had vacillated on colonoscopy but subsequently agreed. Second colonoscopy revealed pancolonic diverticulosis and single ulcerated rectosigmoid junction without stigmata of bleed. May have been the culprit. 2. Acute blood loss anemia. Stable. 3. Essential hypertension. Stable. 4. Thrombocytopenia. Resolved. Likely secondary to GI blood loss.  Continue to hold aspirin.  Home today. Followup biopsy results with gastroenterology.  Discussed with family at bedside  Consultants:  Gastroenterology  Physical therapy: Home health PT, rolling walker Procedures:  Transfusion 2 units packed red blood cells  8/2 colonoscopy could not be completed because of large amount of blood, stool and clots multiple diverticular and sigmoid colon without evidence of stigmata of bleed. 8/6 Impression:  Examination performed to cecum.  Pancolonic diverticulosis.  Single 25 x 10 mm ulcerated rectosigmoid junction without stigmata of bleed. Biopsy taken.  Comment;  She possibly bled from this ulcer. Endoscopically this ulcer appears benign.  Discharge Instructions  Discharge Instructions   Activity as tolerated - No restrictions    Complete by:  As directed      Diet - low sodium heart healthy    Complete by:  As directed      Discharge instructions    Complete by:  As directed   Call physician or seek immediate medical attention for pain, bleeding or worsening of condition.            Medication List    STOP taking these medications       aspirin 81 MG chewable tablet  TAKE these medications       hydrochlorothiazide 25 MG tablet  Commonly known as:  HYDRODIURIL  Take 25 mg by mouth 2 (two) times daily.     MUSCLE RUB 10-15 % Crea    Apply 1 application topically as needed for muscle pain.     NIFEdipine 90 MG 24 hr tablet  Commonly known as:  PROCARDIA XL/ADALAT-CC  Take 90 mg by mouth every morning.     potassium chloride 10 MEQ tablet  Commonly known as:  K-DUR  Take 10 mEq by mouth every morning.     PROAIR HFA 108 (90 BASE) MCG/ACT inhaler  Generic drug:  albuterol  Inhale 2 puffs into the lungs every 6 (six) hours as needed. For shortness of breath     traMADol 50 MG tablet  Commonly known as:  ULTRAM  Take 50 mg by mouth 3 (three) times daily as needed for moderate pain.     VICKS VAPORUB EX  Apply 1 application topically daily as needed (pain).       Allergies  Allergen Reactions  . Penicillins Other (See Comments)    "passes out"    The results of significant diagnostics from this hospitalization (including imaging, microbiology, ancillary and laboratory) are listed below for reference.    Significant Diagnostic Studies: Nm Gi Blood Loss  07/01/2014   CLINICAL DATA:  Lower GI bleed  EXAM: NUCLEAR MEDICINE GASTROINTESTINAL BLEEDING SCAN  TECHNIQUE: Sequential abdominal images were obtained following intravenous administration of Tc-1m labeled red blood cells.  RADIOPHARMACEUTICALS:  Twenty-seven mCi Tc-70m in-vitro labeled red cells.  COMPARISON:  None.  FINDINGS: Satisfactory red blood cell tag.  Mild activity accumulation is present in the midline above the bladder and separate from the bladder. This is seen on early images and is intermittent. Study was continued for 2 hr. The patient refused further imaging. Images between 1hr and 2 hr continue to show slight activity above the bladder. This is suspicious for a low level rectosigmoid bleed.  IMPRESSION: Subtle findings of activity in the pelvis suggestive of rectosigmoid low level bleeding.   Electronically Signed   By: Marlan Palau M.D.   On: 07/01/2014 20:12    Labs: Basic Metabolic Panel:  Recent Labs Lab 06/30/14 0638 07/02/14 0557   NA 141 140  K 3.1* 3.6*  CL 102 105  CO2 25 23  GLUCOSE 149* 86  BUN 15 12  CREATININE 0.86 0.72  CALCIUM 8.8 8.0*   Liver Function Tests:  Recent Labs Lab 06/30/14 0638  AST 22  ALT 10  ALKPHOS 39  BILITOT 0.3  PROT 7.4  ALBUMIN 3.6   CBC:  Recent Labs Lab 06/30/14 0638  07/02/14 0557 07/02/14 1318 07/03/14 0603 07/04/14 0620 07/05/14 0809 07/06/14 0616  WBC 6.5  < > 6.9 8.7 6.2 6.0 5.6 5.4  NEUTROABS 3.7  --  4.4  --   --   --   --   --   HGB 10.1*  < > 9.9* 10.8* 9.9* 9.9* 9.5* 9.7*  HCT 30.6*  < > 29.3* 31.9* 29.3* 30.0* 28.4* 29.8*  MCV 89.2  < > 87.5 88.9 88.3 90.1 90.2 90.6  PLT 182  < > 124* 148* 144* 160 187 191  < > = values in this interval not displayed.   Active Problems:   Acute lower gastrointestinal bleeding   Essential hypertension   Acute lower GI bleeding   Acute blood loss anemia   Time coordinating discharge: 25 minutes  Signed:  Brendia Sacksaniel Shonice Wrisley, MD [facility] 07/06/2014, 1:43 PM

## 2014-07-10 ENCOUNTER — Encounter (HOSPITAL_COMMUNITY): Payer: Self-pay | Admitting: Internal Medicine

## 2014-07-19 ENCOUNTER — Encounter (INDEPENDENT_AMBULATORY_CARE_PROVIDER_SITE_OTHER): Payer: Self-pay | Admitting: *Deleted

## 2014-08-02 ENCOUNTER — Encounter (INDEPENDENT_AMBULATORY_CARE_PROVIDER_SITE_OTHER): Payer: Self-pay | Admitting: Internal Medicine

## 2014-08-02 ENCOUNTER — Ambulatory Visit (INDEPENDENT_AMBULATORY_CARE_PROVIDER_SITE_OTHER): Payer: PRIVATE HEALTH INSURANCE | Admitting: Internal Medicine

## 2014-08-02 VITALS — BP 138/54 | HR 76 | Temp 98.9°F | Ht 60.0 in | Wt 127.0 lb

## 2014-08-02 DIAGNOSIS — K922 Gastrointestinal hemorrhage, unspecified: Secondary | ICD-10-CM

## 2014-08-02 LAB — CBC
HEMATOCRIT: 33.6 % — AB (ref 36.0–46.0)
HEMOGLOBIN: 11.3 g/dL — AB (ref 12.0–15.0)
MCH: 29.2 pg (ref 26.0–34.0)
MCHC: 33.6 g/dL (ref 30.0–36.0)
MCV: 86.8 fL (ref 78.0–100.0)
Platelets: 237 10*3/uL (ref 150–400)
RBC: 3.87 MIL/uL (ref 3.87–5.11)
RDW: 14.5 % (ref 11.5–15.5)
WBC: 6.7 10*3/uL (ref 4.0–10.5)

## 2014-08-02 NOTE — Progress Notes (Signed)
Subjective:    Patient ID: Lynn Nichols, female    DOB: 1914/05/12, 80 y.o.   MRN: [id number]  HPI Here today for f/u after recent admission for rectal bleeding. She was passing fresh blood and clots.  Three was no abdominal associated with her symptoms. Her Hemoglobin did drop to 8.6 .She received 2 units of PRBCs while in the hospital.  She underwent a colonoscopy on 07/05/2014. Please see below.  She tells me she feels pretty good. There has been no further rectal bleeding. Her BMs are green in color. Usually has a BM every other day.  Appetite is good. No weight loss. Ambulates well with a can. Lives with her daughter.   07/06/2014 hemoglobin 9.7. Presently not taking ASA or any other NSAIDs.      History is obtained from the patient and her daughters.    CBC Latest Ref Rng 07/06/2014 07/05/2014 07/04/2014  WBC 4.0 - 10.5 K/uL 5.4 5.6 6.0  Hemoglobin 12.0 - 15.0 g/dL 4.7(Q) 2.5(Z) 5.6(L)  Hematocrit 36.0 - 46.0 % 29.8(L) 28.4(L) 30.0(L)  Platelets 150 - 400 K/uL 191 187 160    CBG (last 3)  No results found for this basename: GLUCAP,  in the last 72 hours  Colonoscopy 07/05/2014 8/6/201Impression:  Examination performed to cecum.  Pancolonic diverticulosis.  Single 25 x 10 mm ulcerated rectosigmoid junction without stigmata of bleed. Biopsy taken.  Comment;  She possibly bled from this ulcer. Endoscopically this ulcer appears benign.   07/01/2014 Bleeding Scan: IMPRESSION:  Subtle findings of activity in the pelvis suggestive of rectosigmoid  low level bleeding.  Review of Systems Past Medical History  Diagnosis Date  . Arthritis   . Hypertension   . Back pain     Past Surgical History  Procedure Laterality Date  . Cholecystectomy    . Abdominal hysterectomy    . Colonoscopy N/A 07/01/2014    Procedure: COLONOSCOPY;  Surgeon: Malissa Hippo, MD;  Location: [facility];  Service: Endoscopy;  Laterality: N/A;  . Colonoscopy N/A 07/05/2014    Procedure: COLONOSCOPY;   Surgeon: Malissa Hippo, MD;  Location: [facility];  Service: Endoscopy;  Laterality: N/A;    Allergies  Allergen Reactions  . Penicillins Other (See Comments)    "passes out"    Current Outpatient Prescriptions on File Prior to Visit  Medication Sig Dispense Refill  . albuterol (PROAIR HFA) 108 (90 BASE) MCG/ACT inhaler Inhale 2 puffs into the lungs every 6 (six) hours as needed. For shortness of breath      . Camphor-Eucalyptus-Menthol (VICKS VAPORUB EX) Apply 1 application topically daily as needed (pain).      . hydrochlorothiazide (HYDRODIURIL) 25 MG tablet Take 25 mg by mouth 2 (two) times daily.       . Menthol-Methyl Salicylate (MUSCLE RUB) 10-15 % CREA Apply 1 application topically as needed for muscle pain.      Marland Kitchen NIFEdipine (PROCARDIA XL/ADALAT-CC) 90 MG 24 hr tablet Take 90 mg by mouth every morning.       . traMADol (ULTRAM) 50 MG tablet Take 50 mg by mouth 3 (three) times daily as needed for moderate pain.       No current facility-administered medications on file prior to visit.        Objective:   Physical Exam    Filed Vitals:   08/02/14 1145  BP: 138/54  Pulse: 76  Temp: 98.9 F (37.2 C)  Height: 5' (1.524 m)  Weight: 127 lb (57.607  kg)   Alert and oriented. Skin warm and dry. Oral mucosa is moist.   . Sclera anicteric, conjunctivae is pink. Thyroid not enlarged. No cervical lymphadenopathy. Lungs clear. Heart regular rate and rhythm.  Abdomen is soft. Bowel sounds are positive. No hepatomegaly. No abdominal masses felt. No tenderness.  No edema to lower extremities . Stool brown and guaiac negative today.   Assessment & Plan:  Probable lower GI bleed resolved. No futher BRRB per patient.  She says she feels better.CBC today. OV 2 months. If she has any further rectal bleeding, she needs to go to the ED.

## 2014-08-02 NOTE — Patient Instructions (Signed)
CBC today. OV 6 weeks.

## 2014-09-13 ENCOUNTER — Ambulatory Visit (INDEPENDENT_AMBULATORY_CARE_PROVIDER_SITE_OTHER): Payer: PRIVATE HEALTH INSURANCE | Admitting: Internal Medicine

## 2014-09-13 ENCOUNTER — Encounter (INDEPENDENT_AMBULATORY_CARE_PROVIDER_SITE_OTHER): Payer: Self-pay | Admitting: Internal Medicine

## 2014-09-13 VITALS — BP 132/68 | HR 80 | Temp 98.0°F | Ht 60.0 in | Wt 127.5 lb

## 2014-09-13 DIAGNOSIS — K625 Hemorrhage of anus and rectum: Secondary | ICD-10-CM

## 2014-09-13 LAB — CBC
HCT: 34.1 % — ABNORMAL LOW (ref 36.0–46.0)
Hemoglobin: 11.4 g/dL — ABNORMAL LOW (ref 12.0–15.0)
MCH: 28.6 pg (ref 26.0–34.0)
MCHC: 33.4 g/dL (ref 30.0–36.0)
MCV: 85.7 fL (ref 78.0–100.0)
PLATELETS: 239 10*3/uL (ref 150–400)
RBC: 3.98 MIL/uL (ref 3.87–5.11)
RDW: 14 % (ref 11.5–15.5)
WBC: 6.5 10*3/uL (ref 4.0–10.5)

## 2014-09-13 NOTE — Progress Notes (Signed)
Subjective:    Patient ID: Lynn Nichols, female    DOB: 1914/01/06, 80 y.o.   MRN: [id number]  HPI Here today for f/u after recent admission in August to [facility]  for a GI bleed. She was passing fresh blood and clot. No abdominal symptoms. Hemoglobin was down to 8.6 and she was transfused with 2 units of PRBC.  She underwent a colonoscopy (see below).  She tells me she is doing good. No weight. Usually has a BM once every two days. She denies melena or BRRB. She lives with her daughter. She works around [employer] house. She stays active. She ambulates with a cane.  CBC    Component Value Date/Time   WBC 6.7 08/02/2014 1207   RBC 3.87 08/02/2014 1207   HGB 11.3* 08/02/2014 1207   HCT 33.6* 08/02/2014 1207   PLT 237 08/02/2014 1207   MCV 86.8 08/02/2014 1207   MCH 29.2 08/02/2014 1207   MCHC 33.6 08/02/2014 1207   RDW 14.5 08/02/2014 1207   LYMPHSABS 1.5 07/02/2014 0557   MONOABS 0.7 07/02/2014 0557   EOSABS 0.3 07/02/2014 0557   BASOSABS 0.0 07/02/2014 0557       08/02/2014   CBC  Latest Ref Rng  07/06/2014  07/05/2014  07/04/2014   WBC  4.0 - 10.5 K/uL  5.4  5.6  6.0   Hemoglobin  12.0 - 15.0 g/dL  6.2(Z9.7(L)  3.0(Q9.5(L)  6.5(H9.9(L)   Hematocrit  36.0 - 46.0 %  29.8(L)  28.4(L)  30.0(L)   Platelets  150 - 400 K/uL  191  187  160   CBG (last 3)  No results found for this basename: GLUCAP, in the last 72 hours  Colonoscopy 07/05/2014  8/6/201Impression:  Examination performed to cecum.  Pancolonic diverticulosis.  Single 25 x 10 mm ulcerated rectosigmoid junction without stigmata of bleed. Biopsy taken.  Comment;  She possibly bled from this ulcer. Endoscopically this ulcer appears benign.   07/01/2014 Bleeding Scan:  IMPRESSION:  Subtle findings of activity in the pelvis suggestive of rectosigmoid      Review of Systems Past Medical History  Diagnosis Date  . Arthritis   . Hypertension   . Back pain     Past Surgical History  Procedure Laterality Date  . Cholecystectomy    . Abdominal hysterectomy    .  Colonoscopy N/A 07/01/2014    Procedure: COLONOSCOPY;  Surgeon: Malissa HippoNajeeb U Rehman, MD;  Location: [facility];  Service: Endoscopy;  Laterality: N/A;  . Colonoscopy N/A 07/05/2014    Procedure: COLONOSCOPY;  Surgeon: Malissa HippoNajeeb U Rehman, MD;  Location: [facility];  Service: Endoscopy;  Laterality: N/A;    Allergies  Allergen Reactions  . Penicillins Other (See Comments)    "passes out"    Current Outpatient Prescriptions on File Prior to Visit  Medication Sig Dispense Refill  . albuterol (PROAIR HFA) 108 (90 BASE) MCG/ACT inhaler Inhale 2 puffs into the lungs every 6 (six) hours as needed. For shortness of breath      . Camphor-Eucalyptus-Menthol (VICKS VAPORUB EX) Apply 1 application topically daily as needed (pain).      . hydrochlorothiazide (HYDRODIURIL) 25 MG tablet Take 25 mg by mouth 2 (two) times daily.       . Menthol-Methyl Salicylate (MUSCLE RUB) 10-15 % CREA Apply 1 application topically as needed for muscle pain.      Marland Kitchen. NIFEdipine (PROCARDIA XL/ADALAT-CC) 90 MG 24 hr tablet Take 90 mg by mouth every morning.       .Marland Kitchen  traMADol (ULTRAM) 50 MG tablet Take 50 mg by mouth 3 (three) times daily as needed for moderate pain.       No current facility-administered medications on file prior to visit.        Objective:   Physical Exam  Filed Vitals:   09/13/14 1141  BP: 132/68  Pulse: 80  Temp: 98 F (36.7 C)  Height: 5' (1.524 m)  Weight: 127 lb 8 oz (57.834 kg)   Alert and oriented. Skin warm and dry. Oral mucosa is moist.   . Sclera anicteric, conjunctivae is pink. Thyroid not enlarged. No cervical lymphadenopathy. Lungs clear. Heart regular rate and rhythm.  Abdomen is soft. Bowel sounds are positive. No hepatomegaly. No abdominal masses felt. No tenderness.  No edema to lower extremities.         Assessment & Plan:  GI bleed. She seems to be doing well. No rectal bleeding. OV in 3 months with a CBC.

## 2014-09-13 NOTE — Patient Instructions (Signed)
OV in 3 months with a CBC. If you have any further rectal bleeding, go to the ED

## 2014-09-19 ENCOUNTER — Telehealth (INDEPENDENT_AMBULATORY_CARE_PROVIDER_SITE_OTHER): Payer: Self-pay | Admitting: *Deleted

## 2014-09-19 DIAGNOSIS — K922 Gastrointestinal hemorrhage, unspecified: Secondary | ICD-10-CM

## 2014-09-19 NOTE — Telephone Encounter (Signed)
.  Per Terri Setzer,NP patient to have labs drawn in 3 months. 

## 2014-10-01 ENCOUNTER — Encounter (INDEPENDENT_AMBULATORY_CARE_PROVIDER_SITE_OTHER): Payer: Self-pay | Admitting: Internal Medicine

## 2014-11-27 ENCOUNTER — Other Ambulatory Visit (INDEPENDENT_AMBULATORY_CARE_PROVIDER_SITE_OTHER): Payer: Self-pay | Admitting: *Deleted

## 2014-11-27 ENCOUNTER — Encounter (INDEPENDENT_AMBULATORY_CARE_PROVIDER_SITE_OTHER): Payer: Self-pay | Admitting: *Deleted

## 2014-11-27 DIAGNOSIS — K922 Gastrointestinal hemorrhage, unspecified: Secondary | ICD-10-CM

## 2014-12-02 ENCOUNTER — Emergency Department (HOSPITAL_COMMUNITY)
Admission: EM | Admit: 2014-12-02 | Discharge: 2014-12-02 | Disposition: A | Discharge status: Home / Self Care | Payer: PRIVATE HEALTH INSURANCE | Attending: Emergency Medicine | Admitting: Emergency Medicine

## 2014-12-02 ENCOUNTER — Encounter (HOSPITAL_COMMUNITY): Payer: Self-pay | Admitting: Emergency Medicine

## 2014-12-02 ENCOUNTER — Emergency Department (HOSPITAL_COMMUNITY): Payer: PRIVATE HEALTH INSURANCE

## 2014-12-02 DIAGNOSIS — Z79899 Other long term (current) drug therapy: Secondary | ICD-10-CM | POA: Diagnosis not present

## 2014-12-02 DIAGNOSIS — R300 Dysuria: Secondary | ICD-10-CM | POA: Diagnosis present

## 2014-12-02 DIAGNOSIS — M1990 Unspecified osteoarthritis, unspecified site: Secondary | ICD-10-CM | POA: Insufficient documentation

## 2014-12-02 DIAGNOSIS — N3000 Acute cystitis without hematuria: Secondary | ICD-10-CM | POA: Diagnosis not present

## 2014-12-02 DIAGNOSIS — H6123 Impacted cerumen, bilateral: Secondary | ICD-10-CM | POA: Diagnosis not present

## 2014-12-02 DIAGNOSIS — R000 Tachycardia, unspecified: Secondary | ICD-10-CM

## 2014-12-02 DIAGNOSIS — I10 Essential (primary) hypertension: Secondary | ICD-10-CM | POA: Insufficient documentation

## 2014-12-02 LAB — BASIC METABOLIC PANEL
Anion gap: 6 (ref 5–15)
BUN: 18 mg/dL (ref 6–23)
CALCIUM: 9.4 mg/dL (ref 8.4–10.5)
CO2: 29 mmol/L (ref 19–32)
CREATININE: 0.88 mg/dL (ref 0.50–1.10)
Chloride: 104 mEq/L (ref 96–112)
GFR, EST AFRICAN AMERICAN: 61 mL/min — AB (ref >90)
GFR, EST NON AFRICAN AMERICAN: 53 mL/min — AB (ref >90)
Glucose, Bld: 109 mg/dL — ABNORMAL HIGH (ref 70–99)
POTASSIUM: 3.5 mmol/L (ref 3.5–5.1)
Sodium: 139 mmol/L (ref 135–145)

## 2014-12-02 LAB — CBC WITH DIFFERENTIAL/PLATELET
BASOS PCT: 1 % (ref 0–1)
Basophils Absolute: 0 10*3/uL (ref 0.0–0.1)
EOS PCT: 1 % (ref 0–5)
Eosinophils Absolute: 0.1 10*3/uL (ref 0.0–0.7)
HCT: 36.7 % (ref 36.0–46.0)
Hemoglobin: 11.8 g/dL — ABNORMAL LOW (ref 12.0–15.0)
Lymphocytes Relative: 35 % (ref 12–46)
Lymphs Abs: 2.3 10*3/uL (ref 0.7–4.0)
MCH: 28.4 pg (ref 26.0–34.0)
MCHC: 32.2 g/dL (ref 30.0–36.0)
MCV: 88.2 fL (ref 78.0–100.0)
Monocytes Absolute: 0.5 10*3/uL (ref 0.1–1.0)
Monocytes Relative: 9 % (ref 3–12)
NEUTROS PCT: 54 % (ref 43–77)
Neutro Abs: 3.5 10*3/uL (ref 1.7–7.7)
PLATELETS: 184 10*3/uL (ref 150–400)
RBC: 4.16 MIL/uL (ref 3.87–5.11)
RDW: 14.6 % (ref 11.5–15.5)
WBC: 6.4 10*3/uL (ref 4.0–10.5)

## 2014-12-02 LAB — URINE MICROSCOPIC-ADD ON

## 2014-12-02 LAB — URINALYSIS, ROUTINE W REFLEX MICROSCOPIC
BILIRUBIN URINE: NEGATIVE
Glucose, UA: NEGATIVE mg/dL
Ketones, ur: NEGATIVE mg/dL
NITRITE: POSITIVE — AB
Protein, ur: NEGATIVE mg/dL
Specific Gravity, Urine: 1.01 (ref 1.005–1.030)
UROBILINOGEN UA: 0.2 mg/dL (ref 0.0–1.0)
pH: 6 (ref 5.0–8.0)

## 2014-12-02 LAB — TSH: TSH: 3.14 u[IU]/mL (ref 0.350–4.500)

## 2014-12-02 LAB — MAGNESIUM: MAGNESIUM: 1.9 mg/dL (ref 1.5–2.5)

## 2014-12-02 LAB — I-STAT CG4 LACTIC ACID, ED: LACTIC ACID, VENOUS: 1.73 mmol/L (ref 0.5–2.2)

## 2014-12-02 LAB — TROPONIN I: Troponin I: <0.03 ng/mL (ref <0.031)

## 2014-12-02 MED ORDER — DOCUSATE SODIUM 50 MG/5ML PO LIQD
50.0000 mg | Freq: Once | ORAL | Status: AC
  Administered 2014-12-02: 50 mg via ORAL
  Filled 2014-12-02: qty 10

## 2014-12-02 MED ORDER — SODIUM CHLORIDE 0.9 % IV SOLN
INTRAVENOUS | Status: DC
  Administered 2014-12-02: 15:00:00 via INTRAVENOUS

## 2014-12-02 MED ORDER — CEPHALEXIN 500 MG PO CAPS: 500.0000 mg | ORAL_CAPSULE | Freq: Two times a day (BID) | ORAL | Status: DC

## 2014-12-02 MED ORDER — PROBIOTIC PO CAPS: 1.0000 | ORAL_CAPSULE | Freq: Every day | ORAL | Status: DC

## 2014-12-02 MED ORDER — SODIUM CHLORIDE 0.9 % IV BOLUS (SEPSIS)
500.0000 mL | Freq: Once | INTRAVENOUS | Status: AC
  Administered 2014-12-02: 500 mL via INTRAVENOUS

## 2014-12-02 MED ORDER — DEXTROSE 5 % IV SOLN
1.0000 g | Freq: Once | INTRAVENOUS | Status: AC
  Administered 2014-12-02: 1 g via INTRAVENOUS
  Filled 2014-12-02: qty 10

## 2014-12-02 NOTE — ED Notes (Signed)
Irrigated right ear with half peroxide and half warm water, pt tolerated well,

## 2014-12-02 NOTE — ED Notes (Signed)
Pt reports dysuria and bilateral ear pain, generalized weakness for last several days. Pt denies any recent or known fevers.

## 2014-12-02 NOTE — Discharge Instructions (Signed)
Urinary Tract Infection Urinary tract infections (UTIs) can develop anywhere along your urinary tract. Your urinary tract is your body's drainage system for removing wastes and extra water. Your urinary tract includes two kidneys, two ureters, a bladder, and a urethra. Your kidneys are a pair of bean-shaped organs. Each kidney is about the size of your fist. They are located below your ribs, one on each side of your spine. CAUSES Infections are caused by microbes, which are microscopic organisms, including fungi, viruses, and bacteria. These organisms are so small that they can only be seen through a microscope. Bacteria are the microbes that most commonly cause UTIs. SYMPTOMS  Symptoms of UTIs may vary by age and gender of the patient and by the location of the infection. Symptoms in young women typically include a frequent and intense urge to urinate and a painful, burning feeling in the bladder or urethra during urination. Older women and men are more likely to be tired, shaky, and weak and have muscle aches and abdominal pain. A fever may mean the infection is in your kidneys. Other symptoms of a kidney infection include pain in your back or sides below the ribs, nausea, and vomiting. DIAGNOSIS To diagnose a UTI, your caregiver will ask you about your symptoms. Your caregiver also will ask to provide a urine sample. The urine sample will be tested for bacteria and white blood cells. White blood cells are made by your body to help fight infection. TREATMENT  Typically, UTIs can be treated with medication. Because most UTIs are caused by a bacterial infection, they usually can be treated with the use of antibiotics. The choice of antibiotic and length of treatment depend on your symptoms and the type of bacteria causing your infection. HOME CARE INSTRUCTIONS  If you were prescribed antibiotics, take them exactly as your caregiver instructs you. Finish the medication even if you feel better after you  have only taken some of the medication.  Drink enough water and fluids to keep your urine clear or pale yellow.  Avoid caffeine, tea, and carbonated beverages. They tend to irritate your bladder.  Empty your bladder often. Avoid holding urine for long periods of time.  Empty your bladder before and after sexual intercourse.  After a bowel movement, women should cleanse from front to back. Use each tissue only once. SEEK MEDICAL CARE IF:   You have back pain.  You develop a fever.  Your symptoms do not begin to resolve within 3 days. SEEK IMMEDIATE MEDICAL CARE IF:   You have severe back pain or lower abdominal pain.  You develop chills.  You have nausea or vomiting.  You have continued burning or discomfort with urination. MAKE SURE YOU:   Understand these instructions.  Will watch your condition.  Will get help right away if you are not doing well or get worse. Document Released: 08/26/2005 Document Revised: 05/17/2012 Document Reviewed: 12/25/2011 [facility] Patient Information 2015 [place], [place]. This information is not intended to replace advice given to you by your health care provider. Make sure you discuss any questions you have with your health care provider.  Cerumen Impaction A cerumen impaction is when the wax in your ear forms a plug. This plug usually causes reduced hearing. Sometimes it also causes an earache or dizziness. Removing a cerumen impaction can be difficult and painful. The wax sticks to the ear canal. The canal is sensitive and bleeds easily. If you try to remove a heavy wax buildup with a cotton tipped swab,  you may push it in further. Irrigation with water, suction, and small ear curettes may be used to clear out the wax. If the impaction is fixed to the skin in the ear canal, ear drops may be needed for a few days to loosen the wax. People who build up a lot of wax frequently can use ear wax removal products available in your local drugstore. SEEK  MEDICAL CARE IF:  You develop an earache, increased hearing loss, or marked dizziness. Document Released: 12/24/2004 Document Revised: 02/08/2012 Document Reviewed: 02/13/2010 [facility] Patient Information 2015 [place], [place]. This information is not intended to replace advice given to you by your health care provider. Make sure you discuss any questions you have with your health care provider.

## 2014-12-02 NOTE — ED Provider Notes (Signed)
This chart was scribed for Lynn Maw Leahanna Buser, DO by Tonye Royalty, ED Scribe. This patient was seen in room APA06/APA06   TIME SEEN: 1411  CHIEF COMPLAINT: dysuria and ear pain  HPI: Lynn Nichols is a 81 y.o. female with history of hypertension who presents to the Emergency Department complaining of dysuria with onset 1-2 days ago and also ear pain with onset 1 week ago. Relative notes she missed Nifedepine because she did not get it filled yesterday. She notes history of asthma but denies using breathing treatment or inhaler before coming here today. She denies fever, chest pain, SOB, vomiting, diarrhea, or appetite change. No bloody stool or melena. She is tachycardic but states she did not notice this.  PCP: ROBERTSON, ANTHONY T, PA-C   ROS: See HPI Constitutional: no fever, appetite change Eyes: no drainage  ENT: no runny nose, positive ear pain Cardiovascular:  no chest pain  Resp: no SOB  GI: no vomiting, diarrhea GU: positive dysuria Integumentary: no rash  Allergy: no hives  Musculoskeletal: no leg swelling  Neurological: no slurred speech ROS otherwise negative  PAST MEDICAL HISTORY/PAST SURGICAL HISTORY:  Past Medical History  Diagnosis Date  . Arthritis   . Hypertension   . Back pain     MEDICATIONS:  Prior to Admission medications   Medication Sig Start Date End Date Taking? Authorizing Provider  albuterol (PROAIR HFA) 108 (90 BASE) MCG/ACT inhaler Inhale 2 puffs into the lungs every 6 (six) hours as needed. For shortness of breath    Historical Provider, MD  Camphor-Eucalyptus-Menthol (VICKS VAPORUB EX) Apply 1 application topically daily as needed (pain).    Historical Provider, MD  hydrochlorothiazide (HYDRODIURIL) 25 MG tablet Take 25 mg by mouth 2 (two) times daily.     Historical Provider, MD  Menthol-Methyl Salicylate (MUSCLE RUB) 10-15 % CREA Apply 1 application topically as needed for muscle pain.    Historical Provider, MD  NIFEdipine (PROCARDIA  XL/ADALAT-CC) 90 MG 24 hr tablet Take 90 mg by mouth every morning.     Historical Provider, MD  potassium chloride (K-DUR) 10 MEQ tablet Take 10 mEq by mouth daily.    Historical Provider, MD  traMADol (ULTRAM) 50 MG tablet Take 50 mg by mouth 3 (three) times daily as needed for moderate pain.    Historical Provider, MD    ALLERGIES:  Allergies  Allergen Reactions  . Penicillins Other (See Comments)    "passes out"    SOCIAL HISTORY:  History  Substance Use Topics  . Smoking status: Never Smoker   . Smokeless tobacco: Current User    Types: Snuff  . Alcohol Use: No    FAMILY HISTORY: Family History  Problem Relation Age of Onset  . Diabetes Other   . Hypertension Mother   . Hypertension Father     EXAM: BP 151/88 mmHg  Pulse 122  Temp(Src) 98.8 F (37.1 C) (Oral)  Resp 18  Ht  (1.626 m)  Wt 136 lb (61.689 kg)  BMI 23.33 kg/m2  SpO2 100% CONSTITUTIONAL: Alert and oriented and responds appropriately to questions. Well-appearing; well-nourished HEAD: Normocephalic EYES: Conjunctivae clear, PERRL ENT: normal nose; no rhinorrhea; moist mucous membranes; pharynx without lesions noted; Bilateral TMs are occluded by cerumen; Multiple dental carries without obvious abcsess; No tonsillar hypertrophy or exudate; No trismus or drooling; No muffled voice; No uvular deviation NECK: Supple, no meningismus, no LAD; No thyromegaly CARD: S1 and S2 appreciated; no murmurs, no clicks, no rubs, no gallops; Regular and tachycardic  RESP: Normal chest excursion without splinting or tachypnea; breath sounds clear and equal bilaterally; no wheezes, no rhonchi, no rales,  ABD/GI: Normal bowel sounds; non-distended; soft, non-tender, no rebound, no guarding BACK:  The back appears normal and is non-tender to palpation, there is no CVA tenderness EXT: Normal ROM in all joints; non-tender to palpation; no edema; normal capillary refill; no cyanosis; no calf tenderness or swelling    SKIN:  Normal color for age and race; warm NEURO: Moves all extremities equally PSYCH: The patient's mood and manner are appropriate. Grooming and personal hygiene are appropriate.  MEDICAL DECISION MAKING: Patient here who is very well-appearing with complaints of dysuria and right ear pain. She is in no distress but is tachycardic infiltrates in the 120s and 140s and appears to be a sinus tachycardia. She adamantly denies chest pain or shortness of breath. No stimulant use recently and has not used her albuterol today. She does not appear significantly dehydrated on exam but will give IV fluids. We'll check basic labs, electrolytes, troponin. Doubt pulmonary embolus given she has no chest pain or shortness of breath. We'll check rectal temperature, urinalysis, chest x-ray.  ED PROGRESS: Pt's labs show Hb 11.8, normal lactate. Electrolytes normal. TSH normal. She does have a nitrite-positive UTI. Urine culture pending. We'll give ceftriaxone.  4:00 PM  Pt's heart rate is now in the 90s. We'll repeat EKG. She still feeling well. We'll irrigate ears and reassess her right TM.  4:50 PM  Pt's HR is in the 80s.  She is afebrile. I have been able to remove some of the cerumen in the right ear canal using a curette but she is unable to tolerate a more and refuses to allow me to do the left side. Her right TM does appear clear. Have offered to flush both ears but she declines. We'll have her follow-up with her PCP. We'll discharge home on Keflex for her UTI as well as perception for probiotics. Discussed return precautions. They verbalize understanding and are comfortable with plan.   EKG Interpretation  Date/Time:  Sunday December 02 2014 14:17:36 EST Ventricular Rate:  141 PR Interval:  115 QRS Duration: 73 QT Interval:  301 QTC Calculation: 461 R Axis:   -45 Text Interpretation:  Sinus tachycardia LAD, consider left anterior fascicular block ST depression, probably rate related Baseline wander in lead(s) V1  V4 Confirmed by Margaretha Mahan,  DO, Vertis Bauder [id number]) on 12/02/2014 2:29:10 PM        EKG Interpretation  Date/Time:  Sunday December 02 2014 16:00:33 EST Ventricular Rate:  91 PR Interval:  245 QRS Duration: 79 QT Interval:  362 QTC Calculation: 445 R Axis:   -32 Text Interpretation:  Sinus rhythm Ventricular premature complex Prolonged PR interval unchanged since 2013 Left axis deviation Baseline wander in lead(s) V4 Confirmed by Gaia Gullikson,  DO, Una Yeomans ([id number]) on 12/02/2014 4:06:31 PM        I personally performed the services described in this documentation, which was scribed in my presence. The recorded information has been reviewed and is accurate.   Lynn Maw Lourdes Manning, DO 12/02/14 1650

## 2014-12-02 NOTE — ED Notes (Addendum)
Pt c/o strong odor to urine, generalized weakness, bilateral ear pain that started a few days ago, Dr Elesa Massed at bedside upon pt arrival to tx room, see EDP assessment for further,

## 2014-12-05 LAB — URINE CULTURE: Colony Count: >=100000

## 2014-12-06 ENCOUNTER — Telehealth (HOSPITAL_BASED_OUTPATIENT_CLINIC_OR_DEPARTMENT_OTHER): Payer: Self-pay | Admitting: Emergency Medicine

## 2014-12-06 NOTE — Telephone Encounter (Signed)
Post ED Visit - Positive Culture Follow-up  Culture report reviewed by antimicrobial stewardship pharmacist: []  Wes Dulaney, Pharm.D., BCPS []  Celedonio MiyamotoJeremy Frens, Pharm.D., BCPS [x]  Georgina PillionElizabeth Martin, Pharm.D., BCPS []  Haw RiverMinh Pham, [address]Ph[address]D., BCPS, AAHIVP []  Estella HuskMichelle Turner, Pharm.D., BCPS, AAHIVP []  Elder CyphersLorie Poole, [address]Pharm[address]D., BCPS  Positive urine culture E. Coli Treated with cephalexin, organism sensitive to the same and no further patient follow-up is required at this time.  Berle MullMiller, Milad Bublitz 12/06/2014, 11:40 AM

## 2014-12-19 ENCOUNTER — Encounter (INDEPENDENT_AMBULATORY_CARE_PROVIDER_SITE_OTHER): Payer: Self-pay | Admitting: Internal Medicine

## 2014-12-19 ENCOUNTER — Ambulatory Visit (INDEPENDENT_AMBULATORY_CARE_PROVIDER_SITE_OTHER): Payer: Medicare Other | Admitting: Internal Medicine

## 2014-12-19 VITALS — BP 142/62 | HR 84 | Temp 97.8°F | Ht 60.0 in | Wt 131.0 lb

## 2014-12-19 DIAGNOSIS — K922 Gastrointestinal hemorrhage, unspecified: Secondary | ICD-10-CM

## 2014-12-19 NOTE — Patient Instructions (Signed)
OV 6 months with a CBC

## 2014-12-19 NOTE — Progress Notes (Addendum)
Subjective:    Patient ID: Lynn Nichols, female    DOB: 1914-03-08, 81 y.o.   MRN: [id number]  HPI Here today for f/u for GI that occurred in August of 2015. [facility]. Hemoglobin dropped to 8.5 and transfused with 2 units of PRBCs.  She underwent a colonoscopy which revealed   Examination performed to cecum.  Pancolonic diverticulosis.  Single 25 x 10 mm ulcerated rectosigmoid junction without stigmata of bleed. Biopsy taken.  Comment;  She possibly bled from this ulcer. Endoscopically this ulcer appears benign.  She tells me she has been doing fair. Her appetite good. She says she eats all day. She says she had abdominal pain yesterday but known today. She usually has a BM daily. No melena or BRRB.  She us still cleaning her own home. She is active at home and still cooking some of her meals. No NSAIDs.    CBC    Component Value Date/Time   WBC 6.4 12/02/2014 1431   RBC 4.16 12/02/2014 1431   HGB 11.8* 12/02/2014 1431   HCT 36.7 12/02/2014 1431   PLT 184 12/02/2014 1431   MCV 88.2 12/02/2014 1431   MCH 28.4 12/02/2014 1431   MCHC 32.2 12/02/2014 1431   RDW 14.6 12/02/2014 1431   LYMPHSABS 2.3 12/02/2014 1431   MONOABS 0.5 12/02/2014 1431   EOSABS 0.1 12/02/2014 1431   BASOSABS 0.0 12/02/2014 1431     CBC Latest Ref Rng 12/02/2014 09/13/2014 08/02/2014  WBC 4.0 - 10.5 K/uL 6.4 6.5 6.7  Hemoglobin 12.0 - 15.0 g/dL 11.8(L) 11.4(L) 11.3(L)  Hematocrit 36.0 - 46.0 % 36.7 34.1(L) 33.6(L)  Platelets 150 - 400 K/uL 184 239 237      Review of Systems Widowed. Had 15 children. One deceased from cancer, One deceased from drowning.     Past Medical History  Diagnosis Date  . Arthritis   . Hypertension   . Back pain     Past Surgical History  Procedure Laterality Date  . Cholecystectomy    . Abdominal hysterectomy    . Colonoscopy N/A 07/01/2014    Procedure: COLONOSCOPY;  Surgeon: Malissa HippoNajeeb U Rehman, MD;  [facility];  Service: Endoscopy;   Laterality: N/A;  . Colonoscopy N/A 07/05/2014    Procedure: COLONOSCOPY;  Surgeon: Malissa HippoNajeeb U Rehman, MD;  Location: [facility];  Service: Endoscopy;  Laterality: N/A;    Allergies  Allergen Reactions  . Penicillins Other (See Comments)    "passes out"    Current Outpatient Prescriptions on File Prior to Visit  Medication Sig Dispense Refill  . albuterol (PROAIR HFA) 108 (90 BASE) MCG/ACT inhaler Inhale 2 puffs into the lungs every 6 (six) hours as needed. For shortness of breath    . Camphor-Eucalyptus-Menthol (VICKS VAPORUB EX) Apply 1 application topically daily as needed (pain).    . cephALEXin (KEFLEX) 500 MG capsule Take 1 capsule (500 mg total) by mouth 2 (two) times daily. 14 capsule 0  . hydrochlorothiazide (HYDRODIURIL) 25 MG tablet Take 25 mg by mouth 2 (two) times daily.     . Menthol-Methyl Salicylate (MUSCLE RUB) 10-15 % CREA Apply 1 application topically as needed for muscle pain.    Marland Kitchen. NIFEdipine (PROCARDIA XL/ADALAT-CC) 90 MG 24 hr tablet Take 90 mg by mouth every morning.     . potassium chloride (K-DUR) 10 MEQ tablet Take 10 mEq by mouth daily.    . Probiotic CAPS Take 1 capsule by mouth daily. 30 capsule 1  .  traMADol (ULTRAM) 50 MG tablet Take 50 mg by mouth 3 (three) times daily as needed for moderate pain.     No current facility-administered medications on file prior to visit.     Objective:   Physical Exam  Filed Vitals:   12/19/14 1021  Height: 5' (1.524 m)  Weight: 131 lb (59.421 kg)  Alert and oriented. Skin warm and dry. Oral mucosa is moist.   . Sclera anicteric, conjunctivae is pink. Thyroid not enlarged. No cervical lymphadenopathy. Lungs clear. Heart regular rate and rhythm.  Abdomen is soft. Bowel sounds are positive. No hepatomegaly. No abdominal masses felt. No tenderness.  No edema to lower extremities.  Patient is delightful.          Assessment & Plan:  Lower GI: bleed. Hemoglobin remains stable. No further rectal bleeding.  Hemoglobin  stable. OV in 6 months with a CBC.

## 2014-12-28 ENCOUNTER — Telehealth (INDEPENDENT_AMBULATORY_CARE_PROVIDER_SITE_OTHER): Payer: Self-pay | Admitting: *Deleted

## 2014-12-28 DIAGNOSIS — K922 Gastrointestinal hemorrhage, unspecified: Secondary | ICD-10-CM

## 2014-12-28 NOTE — Telephone Encounter (Signed)
.  Per Terri Setzer,NP patient to have labs drawn in 6 months. 

## 2015-02-17 ENCOUNTER — Emergency Department (HOSPITAL_COMMUNITY): Payer: Medicare Other

## 2015-02-17 ENCOUNTER — Emergency Department (HOSPITAL_COMMUNITY)
Admission: EM | Admit: 2015-02-17 | Discharge: 2015-02-17 | Disposition: A | Discharge status: Home / Self Care | Payer: Medicare Other | Attending: Emergency Medicine | Admitting: Emergency Medicine

## 2015-02-17 ENCOUNTER — Encounter (HOSPITAL_COMMUNITY): Payer: Self-pay | Admitting: Emergency Medicine

## 2015-02-17 DIAGNOSIS — Z792 Long term (current) use of antibiotics: Secondary | ICD-10-CM | POA: Insufficient documentation

## 2015-02-17 DIAGNOSIS — Z79899 Other long term (current) drug therapy: Secondary | ICD-10-CM | POA: Insufficient documentation

## 2015-02-17 DIAGNOSIS — Z9089 Acquired absence of other organs: Secondary | ICD-10-CM | POA: Diagnosis not present

## 2015-02-17 DIAGNOSIS — I10 Essential (primary) hypertension: Secondary | ICD-10-CM | POA: Insufficient documentation

## 2015-02-17 DIAGNOSIS — R109 Unspecified abdominal pain: Secondary | ICD-10-CM | POA: Diagnosis present

## 2015-02-17 DIAGNOSIS — Z9889 Other specified postprocedural states: Secondary | ICD-10-CM | POA: Diagnosis not present

## 2015-02-17 DIAGNOSIS — M545 Low back pain: Secondary | ICD-10-CM | POA: Diagnosis not present

## 2015-02-17 DIAGNOSIS — Z90710 Acquired absence of both cervix and uterus: Secondary | ICD-10-CM | POA: Insufficient documentation

## 2015-02-17 DIAGNOSIS — Z880 Allergy status to penicillin: Secondary | ICD-10-CM | POA: Diagnosis not present

## 2015-02-17 LAB — CBC WITH DIFFERENTIAL/PLATELET
BASOS PCT: 1 % (ref 0–1)
Basophils Absolute: 0 10*3/uL (ref 0.0–0.1)
EOS ABS: 0.2 10*3/uL (ref 0.0–0.7)
EOS PCT: 3 % (ref 0–5)
HCT: 35 % — ABNORMAL LOW (ref 36.0–46.0)
Hemoglobin: 11.5 g/dL — ABNORMAL LOW (ref 12.0–15.0)
LYMPHS ABS: 2.3 10*3/uL (ref 0.7–4.0)
LYMPHS PCT: 36 % (ref 12–46)
MCH: 28.9 pg (ref 26.0–34.0)
MCHC: 32.9 g/dL (ref 30.0–36.0)
MCV: 87.9 fL (ref 78.0–100.0)
Monocytes Absolute: 0.4 10*3/uL (ref 0.1–1.0)
Monocytes Relative: 6 % (ref 3–12)
NEUTROS PCT: 54 % (ref 43–77)
Neutro Abs: 3.5 10*3/uL (ref 1.7–7.7)
Platelets: 190 10*3/uL (ref 150–400)
RBC: 3.98 MIL/uL (ref 3.87–5.11)
RDW: 13.8 % (ref 11.5–15.5)
WBC: 6.4 10*3/uL (ref 4.0–10.5)

## 2015-02-17 LAB — COMPREHENSIVE METABOLIC PANEL
ALT: 16 U/L (ref 0–35)
AST: 29 U/L (ref 0–37)
Albumin: 3.9 g/dL (ref 3.5–5.2)
Alkaline Phosphatase: 45 U/L (ref 39–117)
Anion gap: 8 (ref 5–15)
BUN: 17 mg/dL (ref 6–23)
CHLORIDE: 103 mmol/L (ref 96–112)
CO2: 27 mmol/L (ref 19–32)
Calcium: 9.1 mg/dL (ref 8.4–10.5)
Creatinine, Ser: 0.86 mg/dL (ref 0.50–1.10)
GFR calc Af Amer: 63 mL/min — ABNORMAL LOW (ref >90)
GFR, EST NON AFRICAN AMERICAN: 54 mL/min — AB (ref >90)
Glucose, Bld: 110 mg/dL — ABNORMAL HIGH (ref 70–99)
POTASSIUM: 3.2 mmol/L — AB (ref 3.5–5.1)
SODIUM: 138 mmol/L (ref 135–145)
TOTAL PROTEIN: 8 g/dL (ref 6.0–8.3)
Total Bilirubin: 0.2 mg/dL — ABNORMAL LOW (ref 0.3–1.2)

## 2015-02-17 LAB — URINALYSIS, ROUTINE W REFLEX MICROSCOPIC
BILIRUBIN URINE: NEGATIVE
Glucose, UA: NEGATIVE mg/dL
Hgb urine dipstick: NEGATIVE
Ketones, ur: NEGATIVE mg/dL
Nitrite: NEGATIVE
Protein, ur: NEGATIVE mg/dL
SPECIFIC GRAVITY, URINE: 1.01 (ref 1.005–1.030)
UROBILINOGEN UA: 0.2 mg/dL (ref 0.0–1.0)
pH: 6.5 (ref 5.0–8.0)

## 2015-02-17 LAB — I-STAT CG4 LACTIC ACID, ED: Lactic Acid, Venous: 0.94 mmol/L (ref 0.5–2.0)

## 2015-02-17 LAB — URINE MICROSCOPIC-ADD ON

## 2015-02-17 LAB — TROPONIN I: Troponin I: <0.03 ng/mL (ref <0.031)

## 2015-02-17 LAB — LIPASE, BLOOD: Lipase: 23 U/L (ref 11–59)

## 2015-02-17 MED ORDER — POTASSIUM CHLORIDE CRYS ER 20 MEQ PO TBCR
40.0000 meq | EXTENDED_RELEASE_TABLET | Freq: Once | ORAL | Status: AC
  Administered 2015-02-17: 40 meq via ORAL
  Filled 2015-02-17: qty 2

## 2015-02-17 NOTE — ED Notes (Signed)
C/o of catch in left upper (side) abdomen.  Denies pain.  Having discomfort for last 7 days.

## 2015-02-17 NOTE — Discharge Instructions (Signed)
Abdominal Pain Your x-ray, labs and urine sample are reassuring. Follow-up with your doctor regarding your discomfort. Return to the ED if you develop new or worsening symptoms. Many things can cause abdominal pain. Usually, abdominal pain is not caused by a disease and will improve without treatment. It can often be observed and treated at home. Your health care provider will do a physical exam and possibly order blood tests and X-rays to help determine the seriousness of your pain. However, in many cases, more time must pass before a clear cause of the pain can be found. Before that point, your health care provider may not know if you need more testing or further treatment. HOME CARE INSTRUCTIONS  Monitor your abdominal pain for any changes. The following actions may help to alleviate any discomfort you are experiencing:  Only take over-the-counter or prescription medicines as directed by your health care provider.  Do not take laxatives unless directed to do so by your health care provider.  Try a clear liquid diet (broth, tea, or water) as directed by your health care provider. Slowly move to a bland diet as tolerated. SEEK MEDICAL CARE IF:  You have unexplained abdominal pain.  You have abdominal pain associated with nausea or diarrhea.  You have pain when you urinate or have a bowel movement.  You experience abdominal pain that wakes you in the night.  You have abdominal pain that is worsened or improved by eating food.  You have abdominal pain that is worsened with eating fatty foods.  You have a fever. SEEK IMMEDIATE MEDICAL CARE IF:   Your pain does not go away within 2 hours.  You keep throwing up (vomiting).  Your pain is felt only in portions of the abdomen, such as the right side or the left lower portion of the abdomen.  You pass bloody or black tarry stools. MAKE SURE YOU:  Understand these instructions.   Will watch your condition.   Will get help right away  if you are not doing well or get worse.  Document Released: 08/26/2005 Document Revised: 11/21/2013 Document Reviewed: 07/26/2013 [facility] Patient Information 2015 [place]. This information is not intended to replace advice given to you by your health care provider. Make sure you discuss any questions you have with your health care provider.

## 2015-02-17 NOTE — ED Notes (Signed)
Patient with no complaints at this time. Respirations even and unlabored. Skin warm/dry. Discharge instructions reviewed with patient at this time. Patient given opportunity to voice concerns/ask questions. IV removed per policy and band-aid applied to site. Patient discharged at this time and left Emergency Department with steady gait.  

## 2015-02-17 NOTE — ED Provider Notes (Signed)
CSN: [id number]     Arrival date & time 02/17/15  1440 History  This chart was scribed for Glynn OctaveStephen Era Parr, MD by Ronney LionSuzanne Le, ED Scribe. This patient was seen in room APA14/APA14 and the patient's care was started at 3:09 PM.    Chief Complaint  Patient presents with  . Abdominal Cramping   The history is provided by the patient. No language interpreter was used.     HPI Comments: Lynn Hailnnie H Nichols is a 81 y.o. female with a history of cholecystectomy who presents to the Emergency Department complaining of a "catch" in her left abdomen that has been intermittent for a week, lasting for 1-2 minutes at a time - she denies actual pain in her abdomen. She states she is asymptomatic currently. This is a new problem. She also endorses some constipation, stating that her last BM was yesterday. Patient reports she has been eating well. She also endorses back pain but states that it is chronic and unchanged from baseline. Nothing makes it better or worse. Patient has been compliant with her medications. She denies a history of cardiac problems or any other abdominal surgeries.  She denies vomiting, diarrhea, hematuria, dysuria, fever, chest pain, cough, SOB, or blood in her stool.    Past Medical History  Diagnosis Date  . Arthritis   . Hypertension   . Back pain    Past Surgical History  Procedure Laterality Date  . Cholecystectomy    . Abdominal hysterectomy    . Colonoscopy N/A 07/01/2014    Procedure: COLONOSCOPY;  Surgeon: Malissa HippoNajeeb U Rehman, MD;  Location[facility];  Service: Endoscopy;  Laterality: N/A;  . Colonoscopy N/A 07/05/2014    Procedure: COLONOSCOPY;  Surgeon: Malissa HippoNajeeb U Rehman, MD;  [facility];  Service: Endoscopy;  Laterality: N/A;   Family History  Problem Relation Age of Onset  . Diabetes Other   . Hypertension Mother   . Hypertension Father    History  Substance Use Topics  . Smoking status: Never Smoker   . Smokeless tobacco: Current User    Types: Snuff   . Alcohol Use: No   OB History    Gravida Para Term Preterm AB TAB SAB Ectopic Multiple Living   14 13 13  1  1   6      Review of Systems  A complete 10 system review of systems was obtained and all systems are negative except as noted in the HPI and PMH.    Allergies  Penicillins  Home Medications   Prior to Admission medications   Medication Sig Start Date End Date Taking? Authorizing Provider  albuterol (PROAIR HFA) 108 (90 BASE) MCG/ACT inhaler Inhale 2 puffs into the lungs every 6 (six) hours as needed. For shortness of breath   Yes Historical Provider, MD  Camphor-Eucalyptus-Menthol (VICKS VAPORUB EX) Apply 1 application topically daily as needed (pain).   Yes Historical Provider, MD  hydrochlorothiazide (HYDRODIURIL) 25 MG tablet Take 25 mg by mouth 2 (two) times daily.    Yes Historical Provider, MD  levobunolol (BETAGAN) 0.5 % ophthalmic solution 1 drop 2 (two) times daily.   Yes Historical Provider, MD  Menthol-Methyl Salicylate (MUSCLE RUB) 10-15 % CREA Apply 1 application topically as needed for muscle pain.   Yes Historical Provider, MD  NIFEdipine (PROCARDIA XL/ADALAT-CC) 90 MG 24 hr tablet Take 90 mg by mouth every morning.    Yes Historical Provider, MD  potassium chloride (K-DUR) 10 MEQ tablet Take 10 mEq by mouth daily.  Yes Historical Provider, MD  traMADol (ULTRAM) 50 MG tablet Take 50 mg by mouth 3 (three) times daily as needed for moderate pain.   Yes Historical Provider, MD  cephALEXin (KEFLEX) 500 MG capsule Take 1 capsule (500 mg total) by mouth 2 (two) times daily. Patient not taking: Reported on 02/17/2015 12/02/14   Layla Maw Ward, DO  Probiotic CAPS Take 1 capsule by mouth daily. Patient not taking: Reported on 02/17/2015 12/02/14   Kristen N Ward, DO   BP 117/71 mmHg  Pulse 78  Temp(Src) 98 F (36.7 C) (Oral)  Resp 16  Ht  (1.651 m)  Wt 134 lb (60.782 kg)  BMI 22.30 kg/m2  SpO2 99% Physical Exam  Constitutional: She is oriented to person,  place, and time. She appears well-developed and well-nourished. No distress.  HENT:  Head: Normocephalic and atraumatic.  Mouth/Throat: Oropharynx is clear and moist. No oropharyngeal exudate.  Eyes: Conjunctivae and EOM are normal. Pupils are equal, round, and reactive to light.  Neck: Normal range of motion. Neck supple.  No meningismus.  Cardiovascular: Normal rate, regular rhythm, normal heart sounds and intact distal pulses.   No murmur heard. Pulmonary/Chest: Effort normal and breath sounds normal. No respiratory distress.  Abdominal: Soft. There is no tenderness. There is no rebound and no guarding.  Patient indicates "catch" over LUQ, although it is non-tender. No tenderness over ribs.   Musculoskeletal: Normal range of motion. She exhibits tenderness. She exhibits no edema.  Paraspinal lumbar tenderness.   Neurological: She is alert and oriented to person, place, and time. No cranial nerve deficit. She exhibits normal muscle tone. Coordination normal.  No ataxia on finger to nose bilaterally. No pronator drift. 5/5 strength throughout. CN 2-12 intact. Negative Romberg. Equal grip strength. Sensation intact. Gait is normal.   Skin: Skin is warm.  Psychiatric: She has a normal mood and affect. Her behavior is normal.  Nursing note and vitals reviewed.   ED Course  Procedures (including critical care time)  DIAGNOSTIC STUDIES: Oxygen Saturation is 97% on room air, normal by my interpretation.    COORDINATION OF CARE: 3:12 PM - Discussed treatment plan with pt at bedside which includes blood tests, EKG, and XR, and pt agreed to plan.   Labs Review Labs Reviewed  URINALYSIS, ROUTINE W REFLEX MICROSCOPIC - Abnormal; Notable for the following:    APPearance HAZY (*)    Leukocytes, UA TRACE (*)    All other components within normal limits  CBC WITH DIFFERENTIAL/PLATELET - Abnormal; Notable for the following:    Hemoglobin 11.5 (*)    HCT 35.0 (*)    All other components within  normal limits  COMPREHENSIVE METABOLIC PANEL - Abnormal; Notable for the following:    Potassium 3.2 (*)    Glucose, Bld 110 (*)    Total Bilirubin 0.2 (*)    GFR calc non Af Amer 54 (*)    GFR calc Af Amer 63 (*)    All other components within normal limits  URINE MICROSCOPIC-ADD ON - Abnormal; Notable for the following:    Squamous Epithelial / LPF FEW (*)    Bacteria, UA FEW (*)    All other components within normal limits  URINE CULTURE  TROPONIN I  LIPASE, BLOOD  I-STAT CG4 LACTIC ACID, ED  I-STAT CG4 LACTIC ACID, ED    Imaging Review Dg Abd Acute W/chest  02/17/2015   CLINICAL DATA:  Left abdominal pain, status post cholecystectomy and history  EXAM: ACUTE ABDOMEN SERIES (  ABDOMEN 2 VIEW & CHEST 1 VIEW)  COMPARISON:  Chest radiographs dated 12/02/2014.  FINDINGS: Lungs are clear.  No pleural effusion or pneumothorax.  Mild cardiomegaly.  Nonobstructive bowel gas pattern. No evidence of free air under the diaphragm on the upright view.  Degenerative changes of the lumbar spine.  IMPRESSION: No evidence of acute cardiopulmonary disease.  No evidence of small bowel obstruction or free air.   Electronically Signed   By: Charline Bills M.D.   On: 02/17/2015 17:38     EKG Interpretation   Date/Time:  Sunday February 17 2015 15:24:55 EDT Ventricular Rate:  88 PR Interval:  265 QRS Duration: 82 QT Interval:  380 QTC Calculation: 460 R Axis:   -19 Text Interpretation:  Sinus rhythm Atrial premature complex Prolonged PR  interval Borderline left axis deviation Abnormal R-wave progression, early  transition Baseline wander in lead(s) V2 No significant change was found  Confirmed by Manus Gunning  MD, Zeenat Jeanbaptiste [phone]) on 02/17/2015 3:35:26 PM      MDM   Final diagnoses:  Abdominal discomfort   1 week history of intermittent "catch" to left upper abdomen lasting a few seconds to minutes at a time. Denies pain. Denies nausea or vomiting. Last bowel movement yesterday. No fever or urinary  symptoms. No chest pain or shortness of breath.  EKG unchanged UA unimpressive with trace leukocytes Labs show mild hypokalemia. Hemoglobin stable.  Workup is reassuring. No pneumothorax.   normal bowel gas pattern on x-ray. Patient denies shortness of breath or chest pain. No tachycardia or hypoxia to suggest PE.  She is symptom-free at this time. She appears stable for discharge and PCP follow-up. Return precautions discussed  I personally performed the services described in this documentation, which was scribed in my presence. The recorded information has been reviewed and is accurate.     Glynn Octave, MD 02/17/15 [phone]

## 2015-02-18 LAB — URINE CULTURE: Colony Count: 70000

## 2015-05-22 ENCOUNTER — Encounter (INDEPENDENT_AMBULATORY_CARE_PROVIDER_SITE_OTHER): Payer: Self-pay | Admitting: *Deleted

## 2015-05-22 ENCOUNTER — Other Ambulatory Visit (INDEPENDENT_AMBULATORY_CARE_PROVIDER_SITE_OTHER): Payer: Self-pay | Admitting: *Deleted

## 2015-05-22 DIAGNOSIS — K922 Gastrointestinal hemorrhage, unspecified: Secondary | ICD-10-CM

## 2015-07-09 ENCOUNTER — Encounter (INDEPENDENT_AMBULATORY_CARE_PROVIDER_SITE_OTHER): Payer: Self-pay | Admitting: Internal Medicine

## 2015-07-09 ENCOUNTER — Ambulatory Visit (INDEPENDENT_AMBULATORY_CARE_PROVIDER_SITE_OTHER): Payer: Medicare Other | Admitting: Internal Medicine

## 2015-07-09 VITALS — BP 130/80 | HR 76 | Temp 98.2°F | Resp 18 | Ht 60.0 in | Wt 132.3 lb

## 2015-07-09 DIAGNOSIS — Z8719 Personal history of other diseases of the digestive system: Secondary | ICD-10-CM | POA: Diagnosis not present

## 2015-07-09 NOTE — Patient Instructions (Addendum)
High fiber diet. Do not take over-the-counter NSAIDs like Advil or Aleve  BC orGoody powder. I do not have any objection if your physician wants to take low-dose aspirin.

## 2015-07-10 ENCOUNTER — Encounter (INDEPENDENT_AMBULATORY_CARE_PROVIDER_SITE_OTHER): Payer: Self-pay | Admitting: Internal Medicine

## 2015-07-10 NOTE — Progress Notes (Signed)
Presenting complaint;  History of diverticular bleed.  Subjective:  Patient is 81 year old African-American female who is here for scheduled visit. She was last seen in January 2016. He denies rectal bleeding or abdominal pain. She says she has a good appetite. She loves to eat sweets. Her bowels move every other day. She does not feel she is constipated. She has generalized arthritis more so in her knees and right shoulder. She takes tramadol on as-needed basis. She knows not to take OTC NSAIDs. Patient's 2 daughters and granddaughter live with her.  Current Medications: Outpatient Encounter Prescriptions as of 07/09/2015  Medication Sig  . albuterol (PROAIR HFA) 108 (90 BASE) MCG/ACT inhaler Inhale 2 puffs into the lungs every 6 (six) hours as needed. For shortness of breath  . Camphor-Eucalyptus-Menthol (VICKS VAPORUB EX) Apply 1 application topically daily as needed (pain).  . hydrochlorothiazide (HYDRODIURIL) 25 MG tablet Take 25 mg by mouth 2 (two) times daily.   . Menthol-Methyl Salicylate (MUSCLE RUB) 10-15 % CREA Apply 1 application topically as needed for muscle pain.  Marland Kitchen NIFEdipine (PROCARDIA XL/ADALAT-CC) 90 MG 24 hr tablet Take 90 mg by mouth every morning.   . potassium chloride (K-DUR) 10 MEQ tablet Take 10 mEq by mouth daily.  . traMADol (ULTRAM) 50 MG tablet Take 50 mg by mouth 3 (three) times daily as needed for moderate pain.  . [DISCONTINUED] cephALEXin (KEFLEX) 500 MG capsule Take 1 capsule (500 mg total) by mouth 2 (two) times daily. (Patient not taking: Reported on 02/17/2015)  . [DISCONTINUED] levobunolol (BETAGAN) 0.5 % ophthalmic solution 1 drop 2 (two) times daily.  . [DISCONTINUED] Probiotic CAPS Take 1 capsule by mouth daily. (Patient not taking: Reported on 02/17/2015)   No facility-administered encounter medications on file as of 07/09/2015.     Objective: Blood pressure 130/80, pulse 76, temperature 98.2 F (36.8 C), temperature source Oral, resp. rate 18, height  5' (1.524 m), weight 132 lb 4.8 oz (60.011 kg). Patient is alert and in no acute distress. He is able to move from chair to examination table with minimal assistance. Conjunctiva is pink. Sclera is nonicteric Oropharyngeal mucosa is normal. No neck masses or thyromegaly noted. Cardiac exam with regular rhythm normal S1 and S2. No murmur or gallop noted. Lungs are clear to auscultation. Abdomen abdomen is symmetrical soft and nontender without organomegaly or masses.  No LE edema or clubbing noted.  Labs/studies Results: H&H from 02/17/2015 was 11.5 and 35.0   Assessment:  History of colonic diverticular bleed one year ago. H&H is normal or low normal has been stable for 1 year. I talk with patient's daughter Steward Drone and advised that she should not take OTC NSAIDs other than low-dose aspirin if recommended by her PCP.  Plan:  Continue high fiber diet. Patient advised not to take OTC NSAIDs. However she can take low-dose aspirin if recommended by her PCP. Office visit on as-needed basis.

## 2015-09-27 ENCOUNTER — Emergency Department (HOSPITAL_COMMUNITY)
Admission: EM | Admit: 2015-09-27 | Discharge: 2015-09-27 | Disposition: A | Discharge status: Home / Self Care | Payer: Medicare Other | Attending: Emergency Medicine | Admitting: Emergency Medicine

## 2015-09-27 ENCOUNTER — Emergency Department (HOSPITAL_COMMUNITY): Payer: Medicare Other

## 2015-09-27 ENCOUNTER — Encounter (HOSPITAL_COMMUNITY): Payer: Self-pay

## 2015-09-27 DIAGNOSIS — M25561 Pain in right knee: Secondary | ICD-10-CM | POA: Insufficient documentation

## 2015-09-27 DIAGNOSIS — Z880 Allergy status to penicillin: Secondary | ICD-10-CM | POA: Insufficient documentation

## 2015-09-27 DIAGNOSIS — Z79899 Other long term (current) drug therapy: Secondary | ICD-10-CM | POA: Insufficient documentation

## 2015-09-27 DIAGNOSIS — G8929 Other chronic pain: Secondary | ICD-10-CM | POA: Insufficient documentation

## 2015-09-27 DIAGNOSIS — M79604 Pain in right leg: Secondary | ICD-10-CM | POA: Diagnosis present

## 2015-09-27 DIAGNOSIS — I10 Essential (primary) hypertension: Secondary | ICD-10-CM | POA: Insufficient documentation

## 2015-09-27 MED ORDER — TRAMADOL HCL 50 MG PO TABS
50.0000 mg | ORAL_TABLET | Freq: Once | ORAL | Status: AC
  Administered 2015-09-27: 50 mg via ORAL
  Filled 2015-09-27: qty 1

## 2015-09-27 NOTE — ED Notes (Signed)
DP and TP pulses present with doppler.  Both sites marked with skin marker.  Doppler and marker remains in the pts room.

## 2015-09-27 NOTE — ED Notes (Signed)
MD at bedside. 

## 2015-09-27 NOTE — Discharge Instructions (Signed)
Joint Pain Take the pain medication as prescribed and follow up with your doctor. Return to the ED if you develop new or worsening symptoms. Joint pain, which is also called arthralgia, can be caused by many things. Joint pain often goes away when you follow your health care provider's instructions for relieving pain at home. However, joint pain can also be caused by conditions that require further treatment. Common causes of joint pain include:  Bruising in the area of the joint.  Overuse of the joint.  Wear and tear on the joints that occur with aging (osteoarthritis).  Various other forms of arthritis.  A buildup of a crystal form of uric acid in the joint (gout).  Infections of the joint (septic arthritis) or of the bone (osteomyelitis). Your health care provider may recommend medicine to help with the pain. If your joint pain continues, additional tests may be needed to diagnose your condition. HOME CARE INSTRUCTIONS Watch your condition for any changes. Follow these instructions as directed to lessen the pain that you are feeling.  Take medicines only as directed by your health care provider.  Rest the affected area for as long as your health care provider says that you should. If directed to do so, raise the painful joint above the level of your heart while you are sitting or lying down.  Do not do things that cause or worsen pain.  If directed, apply ice to the painful area:  Put ice in a plastic bag.  Place a towel between your skin and the bag.  Leave the ice on for 20 minutes, 2-3 times per day.  Wear an elastic bandage, splint, or sling as directed by your health care provider. Loosen the elastic bandage or splint if your fingers or toes become numb and tingle, or if they turn cold and blue.  Begin exercising or stretching the affected area as directed by your health care provider. Ask your health care provider what types of exercise are safe for you.  Keep all  follow-up visits as directed by your health care provider. This is important. SEEK MEDICAL CARE IF:  Your pain increases, and medicine does not help.  Your joint pain does not improve within 3 days.  You have increased bruising or swelling.  You have a fever.  You lose 10 lb (4.5 kg) or more without trying. SEEK IMMEDIATE MEDICAL CARE IF:  You are not able to move the joint.  Your fingers or toes become numb or they turn cold and blue.   This information is not intended to replace advice given to you by your health care provider. Make sure you discuss any questions you have with your health care provider.   Document Released: 11/16/2005 Document Revised: 12/07/2014 Document Reviewed: 08/28/2014 Elsevier Interactive Patient Education [employer]2016 [employer]vier Inc.

## 2015-09-27 NOTE — ED Provider Notes (Signed)
CSN: [id number]     Arrival date & time 09/27/15  4098 History  By signing my name below, I, Lyndel Safe, attest that this documentation has been prepared under the direction and in the presence of Glynn Octave, MD. Electronically Signed: Lyndel Safe, ED Scribe. 09/27/2015. 10:14 AM.   Chief Complaint  Patient presents with  . Leg Pain   The history is provided by the patient and a relative. No language interpreter was used.   HPI Comments: Lynn Nichols is a 81 y.o. female, with a PMhx of arthritis and HTN, who presents to the Emergency Department complaining of an exacerbation of her intermittent chronic anterior, right knee pain that has been gradually worsening X 3 days and is now a constant pain. Per daughter the pt is prescribed PRN tramadol for her chronic pain. The pt took 2 tramadol last night with mild relief of pain; last dose 10 hours ago. Her pain is worse with flexion and extension of right leg. The right knee pain does not radiate to her right hip or posteriorly up her right leg. She ambulates with a cane at baseline but notes difficulty ambulating with the cane today secondary to her exacerbation pain. She has diffuse arthralgias at baseline. No recent falls. Denies abdominal pain, CP, new or worsening back pain, or fevers. She is not taking blood thinning medication.   Past Medical History  Diagnosis Date  . Arthritis   . Hypertension   . Back pain    Past Surgical History  Procedure Laterality Date  . Cholecystectomy    . Abdominal hysterectomy    . Colonoscopy N/A 07/01/2014    Procedure: COLONOSCOPY;  Surgeon: Malissa Hippo, MD;  Location: [facility];  Service: Endoscopy;  Laterality: N/A;  . Colonoscopy N/A 07/05/2014    Procedure: COLONOSCOPY;  Surgeon: Malissa Hippo, MD;  Location: [facility];  Service: Endoscopy;  Laterality: N/A;   Family History  Problem Relation Age of Onset  . Diabetes Other   . Hypertension Mother   . Hypertension  Father    Social History  Substance Use Topics  . Smoking status: Never Smoker   . Smokeless tobacco: Current User    Types: Snuff  . Alcohol Use: No   OB History    Gravida Para Term Preterm AB TAB SAB Ectopic Multiple Living   Review of Systems  Constitutional: Negative for fever.  Cardiovascular: Negative for chest pain.  Gastrointestinal: Negative for abdominal pain.  Musculoskeletal: Positive for arthralgias ( right knee) and gait problem (ambulates with a cane ). Negative for back pain.  Skin: Negative for color change.  Hematological: Does not bruise/bleed easily.  A complete 10 system review of systems was obtained and is otherwise negative except at noted in the HPI and PMH. Allergies  Penicillins  Home Medications   Prior to Admission medications   Medication Sig Start Date End Date Taking? Authorizing Provider  albuterol (PROAIR HFA) 108 (90 BASE) MCG/ACT inhaler Inhale 2 puffs into the lungs every 6 (six) hours as needed. For shortness of breath   Yes Historical Provider, MD  Camphor-Eucalyptus-Menthol (VICKS VAPORUB EX) Apply 1 application topically daily as needed (pain).   Yes Historical Provider, MD  hydrochlorothiazide (HYDRODIURIL) 25 MG tablet Take 25 mg by mouth 2 (two) times daily.    Yes Historical Provider, MD  latanoprost (XALATAN) 0.005 % ophthalmic solution Place 1 drop into both  eyes at bedtime.  09/26/15  Yes Historical Provider, MD  Menthol-Methyl Salicylate (MUSCLE RUB) 10-15 % CREA Apply 1 application topically as needed for muscle pain.   Yes Historical Provider, MD  NIFEdipine (PROCARDIA XL/ADALAT-CC) 90 MG 24 hr tablet Take 90 mg by mouth every morning.    Yes Historical Provider, MD  potassium chloride (K-DUR) 10 MEQ tablet Take 10 mEq by mouth daily.   Yes Historical Provider, MD  traMADol (ULTRAM) 50 MG tablet Take 50 mg by mouth 3 (three) times daily as needed for moderate pain.   Yes Historical Provider, MD   BP  174/78 mmHg  Pulse 83  Temp(Src) 97.7 F (36.5 C) (Oral)  Resp 18  SpO2 96% Physical Exam  Constitutional: She is oriented to person, place, and time. She appears well-developed and well-nourished. No distress.  HENT:  Head: Normocephalic and atraumatic.  Mouth/Throat: Oropharynx is clear and moist. No oropharyngeal exudate.  Eyes: Conjunctivae and EOM are normal. Pupils are equal, round, and reactive to light.  Neck: Normal range of motion. Neck supple.  No meningismus.  Cardiovascular: Normal rate, regular rhythm, normal heart sounds and intact distal pulses.   No murmur heard. Pulmonary/Chest: Effort normal and breath sounds normal. No respiratory distress.  Abdominal: Soft. There is no tenderness. There is no rebound and no guarding.  Musculoskeletal: Normal range of motion. She exhibits tenderness. She exhibits no edema.  Right knee; chronic appearing arthritic deformity, tenderness anteriorly and medially, no large effusion, reduced ROM, no warmth or erythema, intact DP and TP pulses, FROM of hip and ankle.   Neurological: She is alert and oriented to person, place, and time. No cranial nerve deficit. She exhibits normal muscle tone. Coordination normal.  No ataxia on finger to nose bilaterally. No pronator drift. 5/5 strength throughout. CN 2-12 intact. Negative Romberg. Equal grip strength. Sensation intact.   Skin: Skin is warm.  Psychiatric: She has a normal mood and affect. Her behavior is normal.  Nursing note and vitals reviewed.   ED Course  Procedures  DIAGNOSTIC STUDIES: Oxygen Saturation is 96% on RA, adequate by my interpretation.    COORDINATION OF CARE: 10:00 AM Discussed treatment plan with pt at bedside and pt agreed to plan. Will order tramadol for pain management and Xray of right knee and right hip.   Imaging Review Dg Knee Complete 4 Views Right  09/27/2015  CLINICAL DATA:  81 year old female with chronic right knee pain for 1 year. No known injury.  EXAM: RIGHT KNEE - COMPLETE 4+ VIEW COMPARISON:  None. FINDINGS: There is no evidence of acute fracture, subluxation or dislocation. A small knee effusion is present. Tricompartmental degenerative changes are identified, severe in the lateral and patellofemoral compartments. No focal bony lesions are identified. IMPRESSION: Small knee effusion without acute bony abnormality. Tricompartmental degenerative changes, severe in the patellofemoral and lateral compartments. Electronically Signed   By: Harmon PierJeffrey  Hu M.D.   On: 09/27/2015 11:20   Dg Hip Unilat With Pelvis 2-3 Views Right  09/27/2015  CLINICAL DATA:  Low back pain and right hip pain. EXAM: DG HIP (WITH OR WITHOUT PELVIS) 2-3V RIGHT COMPARISON:  None. FINDINGS: Sclerosis and degenerative changes at the pubic symphysis. Degenerative changes in the lower lumbar spine. Pelvic bony ring is intact. Right hip is located without a fracture. IMPRESSION: No acute bone abnormality to the pelvis or right hip. Electronically Signed   By: Richarda OverlieAdam  Henn M.D.   On: 09/27/2015 11:22   I have personally reviewed and evaluated these  images as part of my medical decision-making.   MDM   Final diagnoses:  Right knee pain   Acute and chronic right knee pain over the past several days. Denies any recent trauma. History of arthritis. Taking Tylenol without relief. No fever or vomiting.  Arthritic appearance of right knee. There is no warmth or erythema. Reduced range of motion. Distal pulses intact.  Pain improved after tramadol. Patient with full range of motion. There is no erythema. Do not suspect septic joint. She is able to ambulate with her cane without difficulty.  Suspect her pain is due to osteoarthritis. She has follow-up with her PCP. Continue tramadol as needed. Return precautions discussed.  I personally performed the services described in this documentation, which was scribed in my presence. The recorded information has been reviewed and is  accurate.   Glynn Octave, MD 09/27/15 1257

## 2015-09-27 NOTE — ED Notes (Signed)
complain of pain in right knee. States she has arthritis

## 2015-09-27 NOTE — ED Notes (Signed)
Ambulated pt in the hallway; pt walked with her cane and had no difficulty walking

## 2015-10-25 ENCOUNTER — Emergency Department (HOSPITAL_COMMUNITY): Payer: Medicare Other

## 2015-10-25 ENCOUNTER — Emergency Department (HOSPITAL_COMMUNITY)
Admission: EM | Admit: 2015-10-25 | Discharge: 2015-10-25 | Disposition: A | Discharge status: Home / Self Care | Payer: Medicare Other | Attending: Emergency Medicine | Admitting: Emergency Medicine

## 2015-10-25 ENCOUNTER — Encounter (HOSPITAL_COMMUNITY): Payer: Self-pay | Admitting: Emergency Medicine

## 2015-10-25 DIAGNOSIS — I10 Essential (primary) hypertension: Secondary | ICD-10-CM | POA: Diagnosis not present

## 2015-10-25 DIAGNOSIS — R1013 Epigastric pain: Secondary | ICD-10-CM | POA: Insufficient documentation

## 2015-10-25 DIAGNOSIS — R1011 Right upper quadrant pain: Secondary | ICD-10-CM | POA: Insufficient documentation

## 2015-10-25 DIAGNOSIS — Z880 Allergy status to penicillin: Secondary | ICD-10-CM | POA: Insufficient documentation

## 2015-10-25 DIAGNOSIS — Z9049 Acquired absence of other specified parts of digestive tract: Secondary | ICD-10-CM | POA: Insufficient documentation

## 2015-10-25 DIAGNOSIS — Z90710 Acquired absence of both cervix and uterus: Secondary | ICD-10-CM | POA: Insufficient documentation

## 2015-10-25 DIAGNOSIS — R1010 Upper abdominal pain, unspecified: Secondary | ICD-10-CM | POA: Diagnosis present

## 2015-10-25 DIAGNOSIS — Z79899 Other long term (current) drug therapy: Secondary | ICD-10-CM | POA: Insufficient documentation

## 2015-10-25 DIAGNOSIS — R109 Unspecified abdominal pain: Secondary | ICD-10-CM

## 2015-10-25 DIAGNOSIS — M1990 Unspecified osteoarthritis, unspecified site: Secondary | ICD-10-CM | POA: Insufficient documentation

## 2015-10-25 LAB — CBC WITH DIFFERENTIAL/PLATELET
BASOS ABS: 0 10*3/uL (ref 0.0–0.1)
BASOS PCT: 0 %
EOS PCT: 1 %
Eosinophils Absolute: 0.1 10*3/uL (ref 0.0–0.7)
HEMATOCRIT: 34.3 % — AB (ref 36.0–46.0)
Hemoglobin: 11.5 g/dL — ABNORMAL LOW (ref 12.0–15.0)
Lymphocytes Relative: 12 %
Lymphs Abs: 1.4 10*3/uL (ref 0.7–4.0)
MCH: 29 pg (ref 26.0–34.0)
MCHC: 33.5 g/dL (ref 30.0–36.0)
MCV: 86.4 fL (ref 78.0–100.0)
MONO ABS: 1.2 10*3/uL — AB (ref 0.1–1.0)
Monocytes Relative: 11 %
NEUTROS ABS: 9 10*3/uL — AB (ref 1.7–7.7)
Neutrophils Relative %: 76 %
Platelets: 189 10*3/uL (ref 150–400)
RBC: 3.97 MIL/uL (ref 3.87–5.11)
RDW: 14.1 % (ref 11.5–15.5)
WBC: 11.8 10*3/uL — AB (ref 4.0–10.5)

## 2015-10-25 LAB — COMPREHENSIVE METABOLIC PANEL
ALT: 14 U/L (ref 14–54)
ANION GAP: 9 (ref 5–15)
AST: 24 U/L (ref 15–41)
Albumin: 3.9 g/dL (ref 3.5–5.0)
Alkaline Phosphatase: 47 U/L (ref 38–126)
BILIRUBIN TOTAL: 0.7 mg/dL (ref 0.3–1.2)
BUN: 15 mg/dL (ref 6–20)
CO2: 25 mmol/L (ref 22–32)
Calcium: 8.7 mg/dL — ABNORMAL LOW (ref 8.9–10.3)
Chloride: 103 mmol/L (ref 101–111)
Creatinine, Ser: 0.85 mg/dL (ref 0.44–1.00)
GFR calc non Af Amer: 55 mL/min — ABNORMAL LOW (ref >60)
Glucose, Bld: 130 mg/dL — ABNORMAL HIGH (ref 65–99)
POTASSIUM: 3.5 mmol/L (ref 3.5–5.1)
Sodium: 137 mmol/L (ref 135–145)
TOTAL PROTEIN: 7.8 g/dL (ref 6.5–8.1)

## 2015-10-25 LAB — URINALYSIS, ROUTINE W REFLEX MICROSCOPIC
BILIRUBIN URINE: NEGATIVE
Glucose, UA: NEGATIVE mg/dL
HGB URINE DIPSTICK: NEGATIVE
KETONES UR: NEGATIVE mg/dL
Leukocytes, UA: NEGATIVE
NITRITE: NEGATIVE
Protein, ur: NEGATIVE mg/dL
Specific Gravity, Urine: 1.01 (ref 1.005–1.030)
pH: 6 (ref 5.0–8.0)

## 2015-10-25 LAB — LACTIC ACID, PLASMA: Lactic Acid, Venous: 1.3 mmol/L (ref 0.5–2.0)

## 2015-10-25 LAB — LIPASE, BLOOD: LIPASE: 22 U/L (ref 11–51)

## 2015-10-25 LAB — TROPONIN I

## 2015-10-25 MED ORDER — IOHEXOL 300 MG/ML  SOLN
100.0000 mL | Freq: Once | INTRAMUSCULAR | Status: AC | PRN
  Administered 2015-10-25: 100 mL via INTRAVENOUS

## 2015-10-25 MED ORDER — MORPHINE SULFATE (PF) 2 MG/ML IV SOLN
1.0000 mg | INTRAVENOUS | Status: DC | PRN
  Administered 2015-10-25: 1 mg via INTRAVENOUS
  Filled 2015-10-25: qty 1

## 2015-10-25 MED ORDER — SODIUM CHLORIDE 0.9 % IV SOLN
INTRAVENOUS | Status: DC
  Administered 2015-10-25: 20:00:00 via INTRAVENOUS

## 2015-10-25 NOTE — ED Provider Notes (Signed)
CSN: [id number]     Arrival date & time 10/25/15  1759 History   First MD Initiated Contact with Patient 10/25/15 1900     Chief Complaint  Patient presents with  . Abdominal Pain      HPI Pt was seen at 1920.  Per pt and her family, c/o gradual onset and persistence of constant upper abd "pain" since yesterday.  Has been associated with no other symptoms. Describes the abd pain as "cramping."  Denies N/V, no diarrhea, no fevers, no back pain, no rash, no CP/SOB, no black or blood in stools.       Past Medical History  Diagnosis Date  . Arthritis   . Hypertension   . Back pain    Past Surgical History  Procedure Laterality Date  . Cholecystectomy    . Abdominal hysterectomy    . Colonoscopy N/A 07/01/2014    Procedure: COLONOSCOPY;  Surgeon: Malissa Hippo, MD;  Location: [facility];  Service: Endoscopy;  Laterality: N/A;  . Colonoscopy N/A 07/05/2014    Procedure: COLONOSCOPY;  Surgeon: Malissa Hippo, MD;  Location: [facility];  Service: Endoscopy;  Laterality: N/A;   Family History  Problem Relation Age of Onset  . Diabetes Other   . Hypertension Mother   . Hypertension Father    Social History  Substance Use Topics  . Smoking status: Never Smoker   . Smokeless tobacco: Current User    Types: Snuff  . Alcohol Use: No   OB History    Gravida Para Term Preterm AB TAB SAB Ectopic Multiple Living   Review of Systems ROS: Statement: All systems negative except as marked or noted in the HPI; Constitutional: Negative for fever and chills. ; ; Eyes: Negative for eye pain, redness and discharge. ; ; ENMT: Negative for ear pain, hoarseness, nasal congestion, sinus pressure and sore throat. ; ; Cardiovascular: Negative for chest pain, palpitations, diaphoresis, dyspnea and peripheral edema. ; ; Respiratory: Negative for cough, wheezing and stridor. ; ; Gastrointestinal: +abd pain. Negative for nausea, vomiting, diarrhea, blood in stool, hematemesis,  jaundice and rectal bleeding. . ; ; Genitourinary: Negative for dysuria, flank pain and hematuria. ; ; Musculoskeletal: Negative for back pain and neck pain. Negative for swelling and trauma.; ; Skin: Negative for pruritus, rash, abrasions, blisters, bruising and skin lesion.; ; Neuro: Negative for headache, lightheadedness and neck stiffness. Negative for weakness, altered level of consciousness , altered mental status, extremity weakness, paresthesias, involuntary movement, seizure and syncope.       Allergies  Penicillins  Home Medications   Prior to Admission medications   Medication Sig Start Date End Date Taking? Authorizing Provider  albuterol (PROAIR HFA) 108 (90 BASE) MCG/ACT inhaler Inhale 2 puffs into the lungs every 6 (six) hours as needed. For shortness of breath    Historical Provider, MD  Camphor-Eucalyptus-Menthol (VICKS VAPORUB EX) Apply 1 application topically daily as needed (pain).    Historical Provider, MD  hydrochlorothiazide (HYDRODIURIL) 25 MG tablet Take 25 mg by mouth 2 (two) times daily.     Historical Provider, MD  latanoprost (XALATAN) 0.005 % ophthalmic solution Place 1 drop into both eyes at bedtime.  09/26/15   Historical Provider, MD  Menthol-Methyl Salicylate (MUSCLE RUB) 10-15 % CREA Apply 1 application topically as needed for muscle pain.    Historical Provider, MD  NIFEdipine (PROCARDIA XL/ADALAT-CC) 90 MG 24 hr tablet Take 90  mg by mouth every morning.     Historical Provider, MD  potassium chloride (K-DUR) 10 MEQ tablet Take 10 mEq by mouth daily.    Historical Provider, MD  traMADol (ULTRAM) 50 MG tablet Take 50 mg by mouth 3 (three) times daily as needed for moderate pain.    Historical Provider, MD   BP 139/81 mmHg  Pulse 97  Temp(Src) 99.6 F (37.6 C) (Oral)  Resp 16  Ht 5\' 4"  (1.626 m)  Wt 135 lb (61.236 kg)  BMI 23.16 kg/m2  SpO2 100% Physical Exam  1925: Physical examination:  Nursing notes reviewed; Vital signs and O2 SAT reviewed;   Constitutional: Well developed, Well nourished, Well hydrated, In no acute distress; Head:  Normocephalic, atraumatic; Eyes: EOMI, PERRL, No scleral icterus; ENMT: Mouth and pharynx normal, Mucous membranes moist; Neck: Supple, Full range of motion, No lymphadenopathy; Cardiovascular: Regular rate and rhythm, No gallop; Respiratory: Breath sounds clear & equal bilaterally, No wheezes.  Speaking full sentences with ease, Normal respiratory effort/excursion; Chest: Nontender, Movement normal; Abdomen: Soft, +RUQ, mid-epigastric areas tender to palp. No rebound or guarding. Nondistended, Normal bowel sounds; Genitourinary: No CVA tenderness; Extremities: Pulses normal, No tenderness, No edema, No calf edema or asymmetry.; Neuro: AA&Ox3, Major CN grossly intact.  Speech clear. No gross focal motor or sensory deficits in extremities.; Skin: Color normal, Warm, Dry.   ED Course  Procedures (including critical care time) Labs Review   Imaging Review  I have personally reviewed and evaluated these images and lab results as part of my medical decision-making.   EKG Interpretation None      MDM  MDM Reviewed: previous chart, nursing note and vitals Reviewed previous: labs and ECG Interpretation: labs, ECG, x-ray and CT scan      Results for orders placed or performed during the hospital encounter of 10/25/15  CBC WITH DIFFERENTIAL  Result Value Ref Range   WBC 11.8 (H) 4.0 - 10.5 K/uL   RBC 3.97 3.87 - 5.11 MIL/uL   Hemoglobin 11.5 (L) 12.0 - 15.0 g/dL   HCT 96.0 (L) 45.4 - 09.8 %   MCV 86.4 78.0 - 100.0 fL   MCH 29.0 26.0 - 34.0 pg   MCHC 33.5 30.0 - 36.0 g/dL   RDW 11.9 14.7 - 82.9 %   Platelets 189 150 - 400 K/uL   Neutrophils Relative % 76 %   Neutro Abs 9.0 (H) 1.7 - 7.7 K/uL   Lymphocytes Relative 12 %   Lymphs Abs 1.4 0.7 - 4.0 K/uL   Monocytes Relative 11 %   Monocytes Absolute 1.2 (H) 0.1 - 1.0 K/uL   Eosinophils Relative 1 %   Eosinophils Absolute 0.1 0.0 - 0.7 K/uL    Basophils Relative 0 %   Basophils Absolute 0.0 0.0 - 0.1 K/uL  Comprehensive metabolic panel  Result Value Ref Range   Sodium 137 135 - 145 mmol/L   Potassium 3.5 3.5 - 5.1 mmol/L   Chloride 103 101 - 111 mmol/L   CO2 25 22 - 32 mmol/L   Glucose, Bld 130 (H) 65 - 99 mg/dL   BUN 15 6 - 20 mg/dL   Creatinine, Ser 5.62 0.44 - 1.00 mg/dL   Calcium 8.7 (L) 8.9 - 10.3 mg/dL   Total Protein 7.8 6.5 - 8.1 g/dL   Albumin 3.9 3.5 - 5.0 g/dL   AST 24 15 - 41 U/L   ALT 14 14 - 54 U/L   Alkaline Phosphatase 47 38 - 126 U/L  Total Bilirubin 0.7 0.3 - 1.2 mg/dL   GFR calc non Af Amer 55 (L) >60 mL/min   GFR calc Af Amer >60 >60 mL/min   Anion gap 9 5 - 15  Lipase, blood  Result Value Ref Range   Lipase 22 11 - 51 U/L  Lactic acid, plasma  Result Value Ref Range   Lactic Acid, Venous 1.3 0.5 - 2.0 mmol/L  Urinalysis, Routine w reflex microscopic ([facility])  Result Value Ref Range   Color, Urine YELLOW YELLOW   APPearance CLEAR CLEAR   Specific Gravity, Urine 1.010 1.005 - 1.030   pH 6.0 5.0 - 8.0   Glucose, UA NEGATIVE NEGATIVE mg/dL   Hgb urine dipstick NEGATIVE NEGATIVE   Bilirubin Urine NEGATIVE NEGATIVE   Ketones, ur NEGATIVE NEGATIVE mg/dL   Protein, ur NEGATIVE NEGATIVE mg/dL   Nitrite NEGATIVE NEGATIVE   Leukocytes, UA NEGATIVE NEGATIVE  Troponin I  Result Value Ref Range   Troponin I <0.03 <0.031 ng/mL   Dg Chest 2 View 10/25/2015  CLINICAL DATA:  Shortness of breath, right abdominal pain. EXAM: CHEST  2 VIEW COMPARISON:  02/17/2015 FINDINGS: Cardiomediastinal silhouette is stable. There is streaky bilateral basilar atelectasis or infiltrate. No pulmonary edema. Moderate gaseous distension of the stomach. Osteopenia and degenerative changes thoracic spine. IMPRESSION: No pulmonary edema. Streaky bilateral basilar atelectasis or infiltrate. Moderate gaseous distension of the stomach. Electronically Signed   By: Natasha MeadLiviu  Pop M.D.   On: 10/25/2015 20:33   Ct Abdomen Pelvis W  Contrast 10/25/2015  CLINICAL DATA:  Right upper abdominal and rib pain. No vomiting or diarrhea. EXAM: CT ABDOMEN AND PELVIS WITH CONTRAST TECHNIQUE: Multidetector CT imaging of the abdomen and pelvis was performed using the standard protocol following bolus administration of intravenous contrast. CONTRAST:  100mL OMNIPAQUE IOHEXOL 300 MG/ML  SOLN COMPARISON:  02/22/2012 FINDINGS: Atelectasis or infiltration in the lung bases. With pneumonia not excluded. Surgical absence of the gallbladder. Mild bile duct dilatation is likely normal for postcholecystectomy physiology. No focal liver lesions. The pancreas, spleen, adrenal glands, inferior vena cava, and retroperitoneal lymph nodes are unremarkable. There is a mildly enlarged retrocrural node on the right measuring 10 mm diameter. This is unchanged since previous study suggesting benign etiology. Calcified and tortuous aorta without aneurysm. Calcification of branch vessels. There is probable calcific stenosis of the superior mesenteric artery and both iliac arteries. Bilateral renal cysts. Largest is on the left lower pole and measures 6.1 cm. No hydronephrosis in the kidneys. The stomach, small bowel, and colon are not abnormally distended. Gas and stool filled colon. No free air or free fluid in the abdomen. Pelvis: Uterus is surgically absent. Bladder wall is not thickened. No pelvic mass or lymphadenopathy. No free or loculated pelvic fluid collections. The degenerative changes in the lumbar spine with lumbar scoliosis convex towards the right. Mild anterior subluxation of L4 on L5 is probably degenerative. No destructive bone lesions. IMPRESSION: Prominent atherosclerotic vascular calcifications. 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 No evidence of bowel obstruction or inflammation. Bilateral renal cysts. Bile duct dilatation likely normal for postcholecystectomy physiology. Electronically Signed   By: Burman NievesWilliam   Stevens M.D.   On: 10/25/2015 20:58     2200:  Workup reassuring. Tx symptomatically at this time. Pt has tol PO well while in the ED without N/V. No stooling while in the ED. Feels better and wants to go home now.  Dx and testing d/w pt and family.  Questions  answered.  Verb understanding, agreeable to d/c home with outpt f/u.    Samuel Jester, DO 10/29/15 1902

## 2015-10-25 NOTE — ED Notes (Signed)
Pt is awake, alert, oriented, appears mildly uncomfortable at this time. Family member at bedside.

## 2015-10-25 NOTE — Discharge Instructions (Signed)
°Emergency Department Resource Guide °1) Find a Doctor and Pay Out of Pocket °Although you won't have to find out who is covered by your insurance plan, it is a good idea to ask around and get recommendations. You will then need to call the office and see if the doctor you have chosen will accept you as a new patient and what types of options they offer for patients who are self-pay. Some doctors offer discounts or will set up payment plans for their patients who do not have insurance, but you will need to ask so you aren't surprised when you get to your appointment. ° °2) Contact Your Local Health Department °Not all health departments have doctors that can see patients for sick visits, but many do, so it is worth a call to see if yours does. If you don't know where your local health department is, you can check in your phone book. The CDC also has a tool to help you locate your state's health department, and many state websites also have listings of all of their local health departments. ° °3) Find a Walk-in Clinic °If your illness is not likely to be very severe or complicated, you may want to try a walk in clinic. These are popping up all over the country in pharmacies, drugstores, and shopping centers. They're usually staffed by nurse practitioners or physician assistants that have been trained to treat common illnesses and complaints. They're usually fairly quick and inexpensive. However, if you have serious medical issues or chronic medical problems, these are probably not your best option. ° °No Primary Care Doctor: °- Call [facility] at  832-8000 - they can help you locate a primary care doctor that  accepts your insurance, provides certain services, etc. °- Physician Referral Service- 1-800-533-3463 ° °Chronic Pain Problems: °Organization         Address  Phone   Notes  °[facility]  (336) 297-2271 Patients need to be referred by their primary care doctor.  ° °Medication  Assistance: °Organization         Address  Phone   Notes  °[place] [facility] [address] °[place], [place] [address] (336) 641-8030 --Must be a resident of [place] °-- Must have NO insurance coverage whatsoever (no Medicaid/ Medicare, etc.) °-- The pt. MUST have a primary care doctor that directs their care regularly and follows them in the community °  °[facility]  (866) 331-1348   °[employer]  (888) 892-1162   ° °Agencies that provide inexpensive medical care: °Organization         Address  Phone   Notes  °[facility]  (336) 832-8035   °[facility]    (336) 832-7272   °[facility] [address] °[place], [place] [address] (336) 832-4777   °[facility] [address], °[place] (336) 271-4999   °[facility]    (336) 373-0678   °[facility]    (336) 272-1050   °[facility] ° [address], [place] Phone:  (336) 832-4444, Fax:  (336) 832-4440 Hours of Operation:  9 am - 6 pm, M-F.  Also accepts Medicaid/Medicare and self-pay.  °[facility] ° [address], [place] Phone: (336) 832-3150, Fax: (336) [phone]. Hours of Operation:  8:30 am - 5:30 pm, M-F.  Also accepts Medicaid and self-pay.  °[facility] [place] [address], [place] Phone: (336) 878-6027   °[facility] [address], [place], [place] (336)723-1848, Ext. 123 Mondays & Thursdays: 7-9 AM.  First 15 patients are seen on a first come, first serve basis. °  ° °Medicaid-accepting [place] Providers: ° °Organization         Address  Phone   Notes  °[facility] [address], [place] (336) 641-2100 Also accepts self-pay patients.  °[facility] [address], [place] ° (336) 856-9996   °[facility] [address], [place]  (336) 288-8857   °[facility] [address] [place] [address], [place] (336) 299-7000   °Veita Bland [address], [place]  ° (336) 373-1557 Only accepts [place] Access Medicaid patients after they have their name applied to their card.  ° °Self-Pay (no insurance) in [place]: ° °Organization         Address  Phone   Notes  °Sickle Cell Patients, [facility] [address], [place] (336) 832-1970   °[facility] [address], [place] (336) 832-4400   °[facility] ° [address], [place] (336) 992-4800   °[facility]/Dr. Osei-Bonsu ° [address] [place] [address], [place] or [address], [place] (336) 841-8500 Phone number for both [place] and [place] locations is the same.  °[facility] [address], [place] (336) 299-0000   °[facility] [address] [place] [address], [place] or [address] (336) 852-7530 °(336) 878-2260   °[facility] [address], [place] (336) 350-1642, phone; (336) 294-5005, fax Sees patients 1st and 3rd Saturday of every month.  Must not qualify for public or private insurance (i.e. Medicaid, Medicare, [place] Health Choice, Veterans' Benefits) • Household income should be no more than 200% of the poverty level •The clinic cannot treat you if you are pregnant or think you are pregnant • Sexually transmitted diseases are not treated at the clinic.  ° ° °Dental Care: °Organization         Address  Phone  Notes  °[place] [facility] [address], [place] (336) 641-6152 Accepts children up to age 21 who are enrolled in Medicaid or [place] Health Choice; pregnant women with a Medicaid card; and children who have applied for Medicaid or [place] Health Choice, but were declined, whose parents can pay a reduced fee at time of service.  °[place]  [facility] [place]  [address], [place] (336) 641-7733 Accepts children up to age 21 who are enrolled in Medicaid or [place] Health Choice; pregnant women with a Medicaid card; and children who have applied for Medicaid or [place] Health Choice, but were declined, whose parents can pay a reduced fee at time of service.  °[facility] ° [address], [place] (336) 641-4533 Patients are seen by appointment only. Walk-ins are not accepted. [facility] will see patients 18 years of age and older. °Monday - Tuesday (8am-5pm) °Most Wednesdays (8:30-5pm) °$30 per visit, cash only  °[facility] ° [address], [place] (336) 641-4533 Patients are seen by appointment only. Walk-ins are not accepted. [facility] will see patients 18 years of age and older. °One   Wednesday Evening (Monthly: Volunteer Based).  $30 per visit, cash only  °[facility]  (919) 537-3737 for adults; Children under age 4, call Graduate Pediatric Dentistry at (919) [phone]. Children aged 4-14, please call (919) [phone] to request a pediatric application. ° Dental services are provided in all areas of dental care including fillings, crowns and bridges, complete and partial dentures, implants, gum treatment, root canals, and extractions. Preventive care is also provided. Treatment is provided to both adults and children. °Patients are selected via a lottery and there is often a waiting list. °  °[facility] [address], °[place] ° (336) 763-8833 [email or web address] °  °[facility] [address], [place], [place] (336)723-1848, Ext. 123 Second and Fourth Thursday of each month, opens at 6:30 AM; Clinic ends at 9 AM.  Patients are seen on a first-come first-served basis, and a limited number are seen during each clinic.  ° °[facility] ° [address], [place], [place] (336) 723-7904    Eligibility Requirements °You must have lived in [place], [place], or [place] counties for at least the last three months. °  You cannot be eligible for state or federal sponsored healthcare insurance, including Veterans Administration, Medicaid, or Medicare. °  You generally cannot be eligible for healthcare insurance through your employer.  °  How to apply: °Eligibility screenings are held every Tuesday and Wednesday afternoon from 1:00 pm until 4:00 pm. You do not need an appointment for the interview!  °[facility] [address], [place], [place] 336-631-2330   °[facility]  336-342-8273   °[facility]  336-703-3100   °[facility]  336-570-6415   ° °Behavioral Health Resources in the Community: °Intensive Outpatient Programs °Organization         Address  Phone  Notes  °[facility] [address], [place], [place] 336-878-6098   °[facility] [address], [place], [place] 336-832-9800   °[facility] [address], [place], [place] ° 336-882-2125   °[facility] [address],  °[place], [place] 1-800-853-5163 or 336-641-4981   °Substance Abuse Resources °Organization         Address  Phone  Notes  °[facility]  336-882-2125   °[facility]  336-784-9470   °[facility]  336-285-9073   °[facility]  336-845-3988   °Residential & Outpatient Substance Abuse Program  1-800-659-3381   °Psychological Services °Organization         Address  Phone  Notes  °[facility]  336- 832-9600   °[facility]  336- 378-7881   °[facility] [address], [place] 1-800-853-5163 or 336-641-4981   ° °Mobile Crisis Teams °Organization         Address  Phone  Notes  °[facility], Mobile Crisis Care Unit  1-877-626-1772   °Assertive °[facility] ° [address]  [place], [place] 336-834-9664   °Sharon DeEsch [address] °[place] 336-554-5454   ° °Self-Help/Support Groups °Organization         Address  Phone             Notes  °[facility] of  - variety of support groups  336- 373-1402 Call for more information  °Narcotics Anonymous (NA), [facility] [address], °[place]  2 meetings at this location  ° °  Residential Treatment Programs Organization         Address  Phone  Notes  [facility] [address],    [place]  [phone]-(807)811-6470   [facility]  [address], [place] [phone], [place], [place] [phone]   [facility] [address], [place] [phone] Admissions: 8am-3pm M-F  [facility] [address]    [place], [place] [phone](757) [phone]   [facility] [address], [place], [place] [phone](334)[phone]   [facility] [address]  [place], [place] [phone]   [facility] Intensive Outpatient [address], [place], [place] [phone]   [facility] ([facility] [address]  [place], [place] [phone] or [phone]   [facility] ([facility]) [address] [place], [place] [phone] Accepts Medicaid  [facility] [address]  [place] [phone] Substance Abuse/Addiction Treatment   [facility]         Address  Phone  Notes  [facility]  [phone](888) [phone]   Angie FavaJulie Brannon, PhD [address], [address] [place], [place]   [phone](336) [phone] or [phone](336) [phone]   [facility]    [address] [place], [place] [phone](336) ([phone]) [phone]   [facility] [address], [place], [place] [phone](336) [phone] Insurance/Medicaid/sponsorship through [facility] [address]                                    [place], [place] [phone]) [phone] Therapy/tele-psych/case    [facility] [address]   [place], [place] [phone](336) [phone]    Dr. Lolly MustacheArfeen  [phone](336) [phone]   [facility] [place]  [employer] [facility] 1) [address], [place] 2) [address], [place] 3)  [address], [place] [phone](336) [phone](336) [phone]  [phone](336) [phone]   [facility] Child Abuse Hotline [phone](336) [phone] or [phone](336) [phone] (After Hours)      Take over the counter tylenol, as directed on packaging, as needed for discomfort. Call your regular medical doctor on Monday to schedule a follow up appointment within the next 3 days.  Return to the Emergency Department immediately sooner if worsening.

## 2015-10-25 NOTE — ED Notes (Signed)
Pt states that she started having bad stomach pain yesterday evening.  Denies vomiting or diarrhea.

## 2015-10-25 NOTE — ED Notes (Signed)
Pt points to right upper abd/rib pain, denies vomiting or diarrhea.  Pt appears mildly uncomfortable

## 2015-10-25 NOTE — ED Notes (Signed)
Pt given oral contrast to drink for ct

## 2016-04-11 ENCOUNTER — Emergency Department (HOSPITAL_COMMUNITY): Payer: Medicare Other

## 2016-04-11 ENCOUNTER — Encounter (HOSPITAL_COMMUNITY): Payer: Self-pay | Admitting: *Deleted

## 2016-04-11 ENCOUNTER — Emergency Department (HOSPITAL_COMMUNITY)
Admission: EM | Admit: 2016-04-11 | Discharge: 2016-04-11 | Disposition: A | Discharge status: Home / Self Care | Payer: Medicare Other | Attending: Emergency Medicine | Admitting: Emergency Medicine

## 2016-04-11 DIAGNOSIS — M1990 Unspecified osteoarthritis, unspecified site: Secondary | ICD-10-CM | POA: Insufficient documentation

## 2016-04-11 DIAGNOSIS — R51 Headache: Secondary | ICD-10-CM | POA: Diagnosis present

## 2016-04-11 DIAGNOSIS — J40 Bronchitis, not specified as acute or chronic: Secondary | ICD-10-CM | POA: Diagnosis not present

## 2016-04-11 DIAGNOSIS — Z79899 Other long term (current) drug therapy: Secondary | ICD-10-CM | POA: Insufficient documentation

## 2016-04-11 DIAGNOSIS — I10 Essential (primary) hypertension: Secondary | ICD-10-CM | POA: Insufficient documentation

## 2016-04-11 LAB — RAPID STREP SCREEN (MED CTR MEBANE ONLY): STREPTOCOCCUS, GROUP A SCREEN (DIRECT): NEGATIVE

## 2016-04-11 MED ORDER — AZITHROMYCIN 250 MG PO TABS: ORAL_TABLET | ORAL | Status: DC

## 2016-04-11 MED ORDER — ACETAMINOPHEN 325 MG PO TABS
650.0000 mg | ORAL_TABLET | Freq: Once | ORAL | Status: AC
  Administered 2016-04-11: 650 mg via ORAL
  Filled 2016-04-11: qty 2

## 2016-04-11 NOTE — Discharge Instructions (Signed)
Chest x-ray and strep test were negative.  Will prescribe antibiotic for lingering symptoms.

## 2016-04-11 NOTE — ED Provider Notes (Signed)
CSN: [id number]     Arrival date & time 04/11/16  [id number] History  By signing my name below, I, Emmanuella Mensah, attest that this documentation has been prepared under the direction and in the presence of Lynn HutchingBrian Cash Meadow, MD. Electronically Signed: Angelene GiovanniEmmanuella Mensah, ED Scribe. 04/11/2016. 10:38 AM.     Chief Complaint  Patient presents with  . Dizziness  . Headache   The history is provided by the patient. No language interpreter was used.   HPI Comments: Lynn Nichols is a 82 y.o. female who presents to the Emergency Department complaining of gradually worsening generalized HA onset 3 days ago. Pt explains that she has had URI symptoms with ear "stuffiness" and non-productive cough approx. 3 weeks ago where she received antibiotics and ear drops after being diagnosed with an ear infection. She adds that the ear drops alleviated her symptoms but the cough is still persisting. Pt denies any current sore throat but reports associated raspy voice. She tried Tramadol for temporary relief. No fever, chills, or n/v/d.  PCP: Roda Shuttersaswell   Past Medical History  Diagnosis Date  . Arthritis   . Hypertension   . Back pain    Past Surgical History  Procedure Laterality Date  . Cholecystectomy    . Abdominal hysterectomy    . Colonoscopy N/A 07/01/2014    Procedure: COLONOSCOPY;  Surgeon: Malissa HippoNajeeb U Rehman, MD;  [facility];  Service: Endoscopy;  Laterality: N/A;  . Colonoscopy N/A 07/05/2014    Procedure: COLONOSCOPY;  Surgeon: Malissa HippoNajeeb U Rehman, MD;  [facility];  Service: Endoscopy;  Laterality: N/A;   Family History  Problem Relation Age of Onset  . Diabetes Other   . Hypertension Mother   . Hypertension Father    Social History  Substance Use Topics  . Smoking status: Never Smoker   . Smokeless tobacco: Current User    Types: Snuff  . Alcohol Use: No   OB History    Gravida Para Term Preterm AB TAB SAB Ectopic Multiple Living   14 13 13  1  1   6      Review of  Systems  Constitutional: Negative for fever.  HENT: Positive for voice change. Negative for sore throat.   Respiratory: Positive for cough.   Gastrointestinal: Negative for nausea, vomiting and diarrhea.  Neurological: Positive for headaches.  All other systems reviewed and are negative.     Allergies  Aspirin and Penicillins  Home Medications   Prior to Admission medications   Medication Sig Start Date End Date Taking? Authorizing Provider  latanoprost (XALATAN) 0.005 % ophthalmic solution Place 1 drop into both eyes at bedtime.  09/26/15  Yes Historical Provider, MD  lisinopril (PRINIVIL,ZESTRIL) 5 MG tablet Take 5 mg by mouth daily.   Yes Historical Provider, MD  NIFEdipine (PROCARDIA XL/ADALAT-CC) 90 MG 24 hr tablet Take 90 mg by mouth every morning.    Yes Historical Provider, MD  traMADol (ULTRAM) 50 MG tablet Take 25-50 mg by mouth 3 (three) times daily as needed for moderate pain.    Yes Historical Provider, MD  albuterol (PROAIR HFA) 108 (90 BASE) MCG/ACT inhaler Inhale 2 puffs into the lungs every 6 (six) hours as needed. For shortness of breath    Historical Provider, MD  azithromycin (ZITHROMAX Z-PAK) 250 MG tablet As directed 04/11/16   Lynn HutchingBrian Mousa Prout, MD  Camphor-Eucalyptus-Menthol [facility](VICKS VAPORUB EX) Apply 1 application topically daily as needed (pain).    Historical Provider, MD  Menthol-Methyl Salicylate (MUSCLE RUB)  10-15 % CREA Apply 1 application topically as needed for muscle pain.    Historical Provider, MD   BP 134/63 mmHg  Pulse 83  Temp(Src) 97.5 F (36.4 C) (Oral)  Resp 16  Ht  (1.626 m)  Wt 130 lb (58.968 kg)  BMI 22.30 kg/m2  SpO2 98% Physical Exam  Constitutional: She is oriented to person, place, and time. She appears well-developed and well-nourished.  HENT:  Head: Normocephalic and atraumatic.  Mouth/Throat: Posterior oropharyngeal erythema present.  Eyes: Conjunctivae and EOM are normal. Pupils are equal, round, and reactive to light.  Neck:  Normal range of motion. Neck supple.  Cardiovascular: Normal rate and regular rhythm.   Pulmonary/Chest: Effort normal and breath sounds normal.  Abdominal: Soft. Bowel sounds are normal.  Musculoskeletal: Normal range of motion.  Neurological: She is alert and oriented to person, place, and time.  Skin: Skin is warm and dry.  Psychiatric: She has a normal mood and affect. Her behavior is normal.  Nursing note and vitals reviewed.   ED Course  Procedures (including critical care time) DIAGNOSTIC STUDIES: Oxygen Saturation is 97% on RA, normal by my interpretation.    COORDINATION OF CARE: 10:28 AM- Pt advised of plan for treatment and pt agrees. Pt will receive chest x-ray and rapid strep test for further evaluation. She will receive Tylenol   Labs Review Labs Reviewed  RAPID STREP SCREEN (NOT AT [facility])  CULTURE, GROUP A STREP [facility])    Imaging Review Dg Chest 2 View  04/11/2016  CLINICAL DATA:  Cough, headache for 3 days, generalized weakness EXAM: CHEST  2 VIEW COMPARISON:  10/25/2015 FINDINGS: Cardiomediastinal silhouette is stable. No acute infiltrate or pleural effusion. No pulmonary edema. Mild degenerative changes mid and lower thoracic spine. IMPRESSION: No active cardiopulmonary disease. Mild degenerative changes thoracic spine. Electronically Signed   By: Natasha Mead M.D.   On: 04/11/2016 12:15     Lynn Hutching, MD has personally reviewed and evaluated these images and lab results as part of his medical decision-making.   EKG Interpretation None      MDM   Final diagnoses:  Bronchitis   Patient is in no acute distress. Chest x-ray shows no pneumonia. Strep negative. Will start antibiotic because patient is elderly and slightly immunocompromised.  I personally performed the services described in this documentation, which was scribed in my presence. The recorded information has been reviewed and is accurate.     Lynn Hutching, MD 04/11/16 1335

## 2016-04-11 NOTE — ED Notes (Signed)
Pt comes in with family who state she has had a headache for the last 3 days. Pt verbalizes she started becoming dizzy yesterday. Pt and family denies any n/v/d, speech problems, or unilateral weakness. Pt states she does have generalized weakness. NAD noted upon triage.

## 2016-04-14 LAB — CULTURE, GROUP A STREP (THRC)

## 2016-06-07 ENCOUNTER — Emergency Department (HOSPITAL_COMMUNITY)
Admission: EM | Admit: 2016-06-07 | Discharge: 2016-06-07 | Disposition: A | Discharge status: Home / Self Care | Payer: Medicare Other | Attending: Emergency Medicine | Admitting: Emergency Medicine

## 2016-06-07 ENCOUNTER — Encounter (HOSPITAL_COMMUNITY): Payer: Self-pay | Admitting: Emergency Medicine

## 2016-06-07 DIAGNOSIS — M1990 Unspecified osteoarthritis, unspecified site: Secondary | ICD-10-CM | POA: Insufficient documentation

## 2016-06-07 DIAGNOSIS — G4489 Other headache syndrome: Secondary | ICD-10-CM

## 2016-06-07 DIAGNOSIS — Z79899 Other long term (current) drug therapy: Secondary | ICD-10-CM | POA: Diagnosis not present

## 2016-06-07 DIAGNOSIS — I10 Essential (primary) hypertension: Secondary | ICD-10-CM | POA: Diagnosis not present

## 2016-06-07 DIAGNOSIS — R51 Headache: Secondary | ICD-10-CM | POA: Diagnosis present

## 2016-06-07 MED ORDER — HYDROCODONE-ACETAMINOPHEN 5-325 MG PO TABS
1.0000 | ORAL_TABLET | Freq: Once | ORAL | Status: AC
  Administered 2016-06-07: 1 via ORAL
  Filled 2016-06-07: qty 1

## 2016-06-07 NOTE — ED Notes (Addendum)
Patient c/o headache that radiates down neck. Denies any dizziness or blurred vision. Per patient intermittent nausea. Patient states pain x2 months. Per patient "nagging pain that will not go away."

## 2016-06-07 NOTE — ED Notes (Signed)
MD at bedside. 

## 2016-06-07 NOTE — ED Provider Notes (Signed)
CSN: [id number]     Arrival date & time 06/07/16  1521 History   First MD Initiated Contact with Patient 06/07/16 1658     Chief Complaint  Patient presents with  . Headache    Patient is a [age] y.o. female presenting with headaches. The history is provided by the patient.  Headache Pain location:  Generalized Quality:  Dull ("dull and nagging") Radiates to: neck. Onset quality:  Gradual Duration: 2 months. Timing:  Constant Progression:  Unchanged Chronicity:  Chronic Relieved by:  Nothing Worsened by:  Nothing Associated symptoms: nausea   Associated symptoms: no blurred vision, no fever, no vomiting and no weakness   Patient reports headache for 2 months No trauma No fever/vomiting No visual changes No new weakness She is ambulatory She does report some recent nausea No other new complaints No h/o stroke   Past Medical History  Diagnosis Date  . Arthritis   . Hypertension   . Back pain    Past Surgical History  Procedure Laterality Date  . Cholecystectomy    . Abdominal hysterectomy    . Colonoscopy N/A 07/01/2014    Procedure: COLONOSCOPY;  Surgeon: Malissa HippoNajeeb U Rehman, MD;  Location: [facility];  Service: Endoscopy;  Laterality: N/A;  . Colonoscopy N/A 07/05/2014    Procedure: COLONOSCOPY;  Surgeon: Malissa HippoNajeeb U Rehman, MD;  Location: [facility];  Service: Endoscopy;  Laterality: N/A;   Family History  Problem Relation Age of Onset  . Diabetes Other   . Hypertension Mother   . Hypertension Father    Social History  Substance Use Topics  . Smoking status: Never Smoker   . Smokeless tobacco: Current User    Types: Snuff  . Alcohol Use: No   OB History    Gravida Para Term Preterm AB TAB SAB Ectopic Multiple Living   14 13 13  1  1   6      Review of Systems  Constitutional: Negative for fever.  Eyes: Negative for blurred vision and visual disturbance.  Cardiovascular: Negative for chest pain.  Gastrointestinal: Positive for nausea. Negative for vomiting.   Neurological: Positive for headaches. Negative for weakness.  All other systems reviewed and are negative.     Allergies  Aspirin and Penicillins  Home Medications   Prior to Admission medications   Medication Sig Start Date End Date Taking? Authorizing Provider  albuterol (PROAIR HFA) 108 (90 BASE) MCG/ACT inhaler Inhale 2 puffs into the lungs every 6 (six) hours as needed. For shortness of breath    Historical Provider, MD  azithromycin (ZITHROMAX Z-PAK) 250 MG tablet As directed 04/11/16   Donnetta HutchingBrian Cook, MD  Camphor-Eucalyptus-Menthol [facility](VICKS VAPORUB EX) Apply 1 application topically daily as needed (pain).    Historical Provider, MD  latanoprost (XALATAN) 0.005 % ophthalmic solution Place 1 drop into both eyes at bedtime.  09/26/15   Historical Provider, MD  lisinopril (PRINIVIL,ZESTRIL) 5 MG tablet Take 5 mg by mouth daily.    Historical Provider, MD  Menthol-Methyl Salicylate (MUSCLE RUB) 10-15 % CREA Apply 1 application topically as needed for muscle pain.    Historical Provider, MD  NIFEdipine (PROCARDIA XL/ADALAT-CC) 90 MG 24 hr tablet Take 90 mg by mouth every morning.     Historical Provider, MD  traMADol (ULTRAM) 50 MG tablet Take 25-50 mg by mouth 3 (three) times daily as needed for moderate pain.     Historical Provider, MD   BP 180/74 mmHg  Pulse 64  Temp(Src) 97.6 F (36.4 C) (Oral)  Resp 18  Ht  (1.626 m)  Wt 58.06 kg  BMI 21.96 kg/m2  SpO2 95% Physical Exam CONSTITUTIONAL: Elderly, no distress, appears younger than stated age HEAD: Normocephalic/atraumatic EYES: EOMI/PERRL, no nystagmus, no ptosis ENMT: Mucous membranes moist NECK: supple no meningeal signs, no bruits SPINE/BACK:entire spine nontender CV: S1/S2 noted, no murmurs/rubs/gallops noted LUNGS: Lungs are clear to auscultation bilaterally, no apparent distress ABDOMEN: soft, nontender NEURO:Awake/alert, face symmetric, no arm or leg drift is noted Gait normal without ataxia No past  pointing EXTREMITIES: pulses normal, full ROM SKIN: warm, color normal PSYCH: no abnormalities of mood noted, alert and oriented to situation   ED Course  Procedures  Pt well appearing Appears younger than 89 She has no new neuro deficits She can ambulate with minimal assistance This HA has been present for 2 months Given her age, I do feel she needs neuroimaging but I don't feel she stroke/meningitis/SAH CT scan unavailable I offered MRI brain but pt declines Given the circumstances, and unable to get close followup, will order next day (7/10) CT scan since it under repair Family will await phone call tomorrow to come back for scan   Medications  HYDROcodone-acetaminophen (NORCO/VICODIN) 5-325 MG per tablet 1 tablet (1 tablet Oral Given 06/07/16 1727)    MDM   Final diagnoses:  Other headache syndrome    Nursing notes including past medical history and social history reviewed and considered in documentation     Zadie Rhine, MD 06/07/16 1743

## 2016-06-07 NOTE — Discharge Instructions (Signed)

## 2016-10-25 ENCOUNTER — Emergency Department (HOSPITAL_COMMUNITY)
Admission: EM | Admit: 2016-10-25 | Discharge: 2016-10-25 | Disposition: A | Discharge status: Home / Self Care | Payer: Medicare Other | Attending: Emergency Medicine | Admitting: Emergency Medicine

## 2016-10-25 ENCOUNTER — Encounter (HOSPITAL_COMMUNITY): Payer: Self-pay | Admitting: Emergency Medicine

## 2016-10-25 DIAGNOSIS — I10 Essential (primary) hypertension: Secondary | ICD-10-CM | POA: Insufficient documentation

## 2016-10-25 DIAGNOSIS — H9202 Otalgia, left ear: Secondary | ICD-10-CM

## 2016-10-25 DIAGNOSIS — H6592 Unspecified nonsuppurative otitis media, left ear: Secondary | ICD-10-CM | POA: Insufficient documentation

## 2016-10-25 DIAGNOSIS — F17290 Nicotine dependence, other tobacco product, uncomplicated: Secondary | ICD-10-CM | POA: Diagnosis not present

## 2016-10-25 DIAGNOSIS — Z79899 Other long term (current) drug therapy: Secondary | ICD-10-CM | POA: Diagnosis not present

## 2016-10-25 HISTORY — DX: Headache: R51

## 2016-10-25 HISTORY — DX: Headache, unspecified: R51.9

## 2016-10-25 HISTORY — DX: Diverticulosis of intestine, part unspecified, without perforation or abscess without bleeding: K57.90

## 2016-10-25 MED ORDER — NEOMYCIN-POLYMYXIN-HC 3.5-10000-1 OT SUSP: 4.0000 [drp] | Freq: Three times a day (TID) | OTIC | 0 refills | Status: DC

## 2016-10-25 NOTE — Discharge Instructions (Signed)
Do not use bobby pins to scratch or clean your ears.  Continue taking your antibiotic as directed.  Follow-up with your primary doctor if needed.

## 2016-10-25 NOTE — ED Triage Notes (Signed)
Patient c/o left ear pain with bloody drainage. Per patient went to PCP on Thursday for ear pain, sinus pressure, and headache. Patient prescribed fluticasone propionate nasal spray and azithromycin. Patient reports improvement in sinus pressure and headache. Patient states noticed bleeding from ear last night while taking shower. Patient denies any pain in left ear this morning. Per patient "maybe slight impairment in hearing from left ear."

## 2016-10-25 NOTE — ED Provider Notes (Signed)
[facility] Provider Note   CSN: [id number] Arrival date & time: 10/25/16  0917     History   Chief Complaint Chief Complaint  Patient presents with  . Otalgia    HPI Lynn Nichols is a 82 y.o. female.  HPI   Lynn Nichols is a 82 y.o. female who presents to the Emergency Department complaining of left ear pain.  She states she has had symptoms of nasal congestion left ear pain and sinus pressure for several days.  She was seen by her PMD three days ago and prescribed a nasal spray and a Z pack which she is currently taking. Reports decreased hearing from the left ear since onset of her symptoms.  Last evening, she noticed blood coming from her left ear while taking her bath.  Bleeding episode was brief and has resolved today.  She denies fever, dizziness, vomiting, headache and facial pain   Past Medical History:  Diagnosis Date  . Arthritis   . Back pain   . Diverticulosis   . Headache   . Hypertension     Patient Active Problem List   Diagnosis Date Noted  . Acute blood loss anemia 07/05/2014  . Acute lower gastrointestinal bleeding 06/30/2014  . Essential hypertension 06/30/2014  . Acute lower GI bleeding 06/30/2014    Past Surgical History:  Procedure Laterality Date  . ABDOMINAL HYSTERECTOMY    . CHOLECYSTECTOMY    . COLONOSCOPY N/A 07/01/2014   Procedure: COLONOSCOPY;  Surgeon: Malissa Hippo, MD;  Location: [facility];  Service: Endoscopy;  Laterality: N/A;  . COLONOSCOPY N/A 07/05/2014   Procedure: COLONOSCOPY;  Surgeon: Malissa Hippo, MD;  Location: [facility];  Service: Endoscopy;  Laterality: N/A;    OB History    Gravida Para Term Preterm AB Living   14 13 13   1 6    SAB TAB Ectopic Multiple Live Births   1               Home Medications    Prior to Admission medications   Medication Sig Start Date End Date Taking? Authorizing Provider  albuterol (PROAIR HFA) 108 (90 BASE) MCG/ACT inhaler Inhale 2 puffs into the  lungs every 6 (six) hours as needed. For shortness of breath   Yes Historical Provider, MD  azithromycin (ZITHROMAX) 500 MG tablet 1 tablet daily. 10/21/16  Yes Historical Provider, MD  fluticasone (FLONASE) 50 MCG/ACT nasal spray 2 sprays 2 (two) times daily. 10/21/16  Yes Historical Provider, MD  hydrochlorothiazide (MICROZIDE) 12.5 MG capsule 1 capsule daily. 08/21/16  Yes Historical Provider, MD  latanoprost (XALATAN) 0.005 % ophthalmic solution Place 1 drop into both eyes at bedtime.  09/26/15  Yes Historical Provider, MD  lisinopril (PRINIVIL,ZESTRIL) 10 MG tablet Take 10 mg by mouth daily.    Yes Historical Provider, MD  Menthol-Methyl Salicylate (MUSCLE RUB) 10-15 % CREA Apply 1 application topically as needed for muscle pain.   Yes Historical Provider, MD  NIFEdipine (PROCARDIA XL/ADALAT-CC) 90 MG 24 hr tablet Take 90 mg by mouth every morning.    Yes Historical Provider, MD  traMADol (ULTRAM) 50 MG tablet Take 50 mg by mouth 3 (three) times daily as needed for moderate pain.    Yes Historical Provider, MD  Camphor-Eucalyptus-Menthol (VICKS VAPORUB EX) Apply 1 application topically daily as needed (pain).    Historical Provider, MD    Family History Family History  Problem Relation Age of Onset  . Hypertension Mother   . Hypertension  Father   . Diabetes Other     Social History Social History  Substance Use Topics  . Smoking status: Never Smoker  . Smokeless tobacco: Current User    Types: Snuff  . Alcohol use No     Allergies   Aspirin and Penicillins   Review of Systems Review of Systems  Constitutional: Negative for activity change, appetite change, chills and fever.  HENT: Positive for congestion and ear pain. Negative for ear discharge, facial swelling, rhinorrhea, sore throat and trouble swallowing.   Eyes: Negative for visual disturbance.  Respiratory: Negative for cough, chest tightness, shortness of breath, wheezing and stridor.   Cardiovascular: Negative for  chest pain.  Gastrointestinal: Negative for abdominal pain, nausea and vomiting.  Musculoskeletal: Negative for neck pain and neck stiffness.  Skin: Negative.   Neurological: Negative for dizziness, weakness, numbness and headaches.  Hematological: Negative for adenopathy.  Psychiatric/Behavioral: Negative for confusion.  All other systems reviewed and are negative.    Physical Exam Updated Vital Signs BP 176/75 (BP Location: Left Arm)   Pulse 89   Temp 97.7 F (36.5 C) (Oral)   Resp 18   Ht 5\' 4"  (1.626 m)   Wt 59 kg   SpO2 100%   BMI 22.31 kg/m   Physical Exam  Constitutional: She is oriented to person, place, and time. She appears well-developed and well-nourished. No distress.  HENT:  Head: Normocephalic and atraumatic.  Right Ear: Tympanic membrane and ear canal normal.  Left Ear: No drainage. Tympanic membrane is not perforated and not bulging. Decreased hearing is noted.  Nose: Nose normal. No mucosal edema.  Mouth/Throat: Uvula is midline, oropharynx is clear and moist and mucous membranes are normal. No uvula swelling. No oropharyngeal exudate.  Dried blood to the left ear canal.  No active bleeding.  Mild cerumen, TM poorly visualized but appears intact.  No drainage or edema of the ear canal  Eyes: EOM are normal. Pupils are equal, round, and reactive to light.  Neck: Normal range of motion. Neck supple.  Cardiovascular: Normal rate, regular rhythm and intact distal pulses.   No murmur heard. Pulmonary/Chest: Effort normal and breath sounds normal. No stridor. No respiratory distress.  Lymphadenopathy:    She has no cervical adenopathy.  Neurological: She is alert and oriented to person, place, and time. Coordination normal.  Skin: Skin is warm and dry. No rash noted.  Nursing note and vitals reviewed.    ED Treatments / Results  Labs (all labs ordered are listed, but only abnormal results are displayed) Labs Reviewed - No data to display  EKG  EKG  Interpretation None       Radiology No results found.  Procedures Procedures (including critical care time)  Medications Ordered in ED Medications - No data to display   Initial Impression / Assessment and Plan / ED Course  I have reviewed the triage vital signs and the nursing notes.  Pertinent labs & imaging results that were available during my care of the patient were reviewed by me and considered in my medical decision making (see chart for details).  Clinical Course    On further questioning, pt admits to using a bobby pin in her ear to scratch it.  Bleeding likely from trauma to the canal.   No TM perforation or active bleeding.    Pt is overall well appearing.  Vitals stable.  Ambulates in the dept with a cane and unassisted.  Currently taking a Z pack.  Will rx  cortisporin otic drops. Agrees to PMD f/u if needed. Discussed care plan with Dr. Clarene DukeMcManus prior to pt's discharge.  Final Clinical Impressions(s) / ED Diagnoses   Final diagnoses:  Left non-suppurative otitis media  Otalgia of left ear    New Prescriptions New Prescriptions   No medications on file     Pauline Ausammy Gabreil [place], PA-C 10/28/16 0840    Samuel JesterKathleen McManus, DO 10/29/16 2201

## 2017-03-11 ENCOUNTER — Emergency Department (HOSPITAL_COMMUNITY): Payer: Medicare Other

## 2017-03-11 ENCOUNTER — Emergency Department (HOSPITAL_COMMUNITY)
Admission: EM | Admit: 2017-03-11 | Discharge: 2017-03-11 | Disposition: A | Discharge status: Home / Self Care | Payer: Medicare Other | Attending: Emergency Medicine | Admitting: Emergency Medicine

## 2017-03-11 ENCOUNTER — Encounter (HOSPITAL_COMMUNITY): Payer: Self-pay | Admitting: *Deleted

## 2017-03-11 DIAGNOSIS — R109 Unspecified abdominal pain: Secondary | ICD-10-CM | POA: Insufficient documentation

## 2017-03-11 DIAGNOSIS — H9209 Otalgia, unspecified ear: Secondary | ICD-10-CM | POA: Insufficient documentation

## 2017-03-11 DIAGNOSIS — R079 Chest pain, unspecified: Secondary | ICD-10-CM | POA: Diagnosis present

## 2017-03-11 DIAGNOSIS — R110 Nausea: Secondary | ICD-10-CM | POA: Diagnosis not present

## 2017-03-11 DIAGNOSIS — I10 Essential (primary) hypertension: Secondary | ICD-10-CM | POA: Diagnosis not present

## 2017-03-11 DIAGNOSIS — R51 Headache: Secondary | ICD-10-CM | POA: Insufficient documentation

## 2017-03-11 DIAGNOSIS — F17290 Nicotine dependence, other tobacco product, uncomplicated: Secondary | ICD-10-CM | POA: Insufficient documentation

## 2017-03-11 DIAGNOSIS — Z79899 Other long term (current) drug therapy: Secondary | ICD-10-CM | POA: Diagnosis not present

## 2017-03-11 LAB — BASIC METABOLIC PANEL
Anion gap: 7 (ref 5–15)
BUN: 13 mg/dL (ref 6–20)
CO2: 30 mmol/L (ref 22–32)
Calcium: 8.9 mg/dL (ref 8.9–10.3)
Chloride: 102 mmol/L (ref 101–111)
Creatinine, Ser: 0.87 mg/dL (ref 0.44–1.00)
GFR calc Af Amer: >60 mL/min (ref >60)
GFR calc non Af Amer: 53 mL/min — ABNORMAL LOW (ref >60)
Glucose, Bld: 124 mg/dL — ABNORMAL HIGH (ref 65–99)
Potassium: 3.2 mmol/L — ABNORMAL LOW (ref 3.5–5.1)
Sodium: 139 mmol/L (ref 135–145)

## 2017-03-11 LAB — CBC WITH DIFFERENTIAL/PLATELET
Basophils Absolute: 0 10*3/uL (ref 0.0–0.1)
Basophils Relative: 0 %
Eosinophils Absolute: 0.2 10*3/uL (ref 0.0–0.7)
Eosinophils Relative: 3 %
HCT: 38.1 % (ref 36.0–46.0)
Hemoglobin: 12.5 g/dL (ref 12.0–15.0)
Lymphocytes Relative: 32 %
Lymphs Abs: 1.7 10*3/uL (ref 0.7–4.0)
MCH: 29.2 pg (ref 26.0–34.0)
MCHC: 32.8 g/dL (ref 30.0–36.0)
MCV: 89 fL (ref 78.0–100.0)
Monocytes Absolute: 0.4 10*3/uL (ref 0.1–1.0)
Monocytes Relative: 7 %
Neutro Abs: 3.1 10*3/uL (ref 1.7–7.7)
Neutrophils Relative %: 58 %
Platelets: 169 10*3/uL (ref 150–400)
RBC: 4.28 MIL/uL (ref 3.87–5.11)
RDW: 14 % (ref 11.5–15.5)
WBC: 5.4 10*3/uL (ref 4.0–10.5)

## 2017-03-11 LAB — TROPONIN I: Troponin I: <0.03 ng/mL (ref <0.03)

## 2017-03-11 MED ORDER — POTASSIUM CHLORIDE CRYS ER 20 MEQ PO TBCR
40.0000 meq | EXTENDED_RELEASE_TABLET | Freq: Once | ORAL | Status: AC
  Administered 2017-03-11: 40 meq via ORAL
  Filled 2017-03-11: qty 2

## 2017-03-11 NOTE — ED Provider Notes (Signed)
[facility] Provider Note   CSN: [id number] Arrival date & time: 03/11/17  1208  By signing my name below, I, Majel Homer, attest that this documentation has been prepared under the direction and in the presence of Raeford Razor, MD . Electronically Signed: Majel Homer, Scribe. 03/11/2017. 12:47 PM.  History   Chief Complaint Chief Complaint  Patient presents with  . Chest Pain   The history is provided by the patient. No language interpreter was used.   HPI Comments: Lynn Nichols is a 83 y.o. female with PMHx of HTN, who presents to the Emergency Department complaining of gradually improving, non-radiating, central chest pain that began at ~8:30 AM this morning while sitting down. Pt reports her pain lasted for 5-10 minutes before spontaneously improving. She states associated nausea, headache, left ear pain, and mild abdominal pain that began last night before the onset of her chest pain. She notes she "didn't feel quite right" when waking up this morning but states this feeling has now resolved. She denies any shortness of breath, diaphoresis, or leg swelling.   Past Medical History:  Diagnosis Date  . Arthritis   . Back pain   . Diverticulosis   . Headache   . Hypertension    Patient Active Problem List   Diagnosis Date Noted  . Acute blood loss anemia 07/05/2014  . Acute lower gastrointestinal bleeding 06/30/2014  . Essential hypertension 06/30/2014  . Acute lower GI bleeding 06/30/2014   Past Surgical History:  Procedure Laterality Date  . ABDOMINAL HYSTERECTOMY    . CHOLECYSTECTOMY    . COLONOSCOPY N/A 07/01/2014   Procedure: COLONOSCOPY;  Surgeon: Malissa Hippo, MD;  Location: [facility];  Service: Endoscopy;  Laterality: N/A;  . COLONOSCOPY N/A 07/05/2014   Procedure: COLONOSCOPY;  Surgeon: Malissa Hippo, MD;  Location: [facility];  Service: Endoscopy;  Laterality: N/A;    OB History    Gravida Para Term Preterm AB Living   SAB  TAB Ectopic Multiple Live Births   1             Home Medications    Prior to Admission medications   Medication Sig Start Date End Date Taking? Authorizing Provider  albuterol (PROAIR HFA) 108 (90 BASE) MCG/ACT inhaler Inhale 2 puffs into the lungs every 6 (six) hours as needed. For shortness of breath    Historical Provider, MD  azithromycin (ZITHROMAX) 500 MG tablet 1 tablet daily. 10/21/16   Historical Provider, MD  Camphor-Eucalyptus-Menthol (VICKS VAPORUB EX) Apply 1 application topically daily as needed (pain).    Historical Provider, MD  fluticasone (FLONASE) 50 MCG/ACT nasal spray 2 sprays 2 (two) times daily. 10/21/16   Historical Provider, MD  hydrochlorothiazide (MICROZIDE) 12.5 MG capsule 1 capsule daily. 08/21/16   Historical Provider, MD  latanoprost (XALATAN) 0.005 % ophthalmic solution Place 1 drop into both eyes at bedtime.  09/26/15   Historical Provider, MD  lisinopril (PRINIVIL,ZESTRIL) 10 MG tablet Take 10 mg by mouth daily.     Historical Provider, MD  Menthol-Methyl Salicylate (MUSCLE RUB) 10-15 % CREA Apply 1 application topically as needed for muscle pain.    Historical Provider, MD  neomycin-polymyxin-hydrocortisone (CORTISPORIN) 3.5-10000-1 otic suspension Place 4 drops into the left ear 3 (three) times daily. For 7 days 10/25/16   Tammy Triplett, PA-C  NIFEdipine (PROCARDIA XL/ADALAT-CC) 90 MG 24 hr tablet Take 90 mg by mouth every morning.     Historical Provider,  MD  traMADol (ULTRAM) 50 MG tablet Take 50 mg by mouth 3 (three) times daily as needed for moderate pain.     Historical Provider, MD   Family History Family History  Problem Relation Age of Onset  . Hypertension Mother   . Hypertension Father   . Diabetes Other     Social History Social History  Substance Use Topics  . Smoking status: Never Smoker  . Smokeless tobacco: Current User    Types: Snuff  . Alcohol use No   Allergies   Aspirin and Penicillins   Review of Systems Review of  Systems  Constitutional: Negative for diaphoresis.  HENT: Positive for ear pain.   Respiratory: Negative for shortness of breath.   Cardiovascular: Positive for chest pain. Negative for leg swelling.  Gastrointestinal: Positive for abdominal pain and nausea.  Neurological: Positive for headaches.  All other systems reviewed and are negative.  Physical Exam Updated Vital Signs BP (!) 205/77   Pulse 85   Temp 98.1 F (36.7 C) (Oral)   Resp (!) 22   Ht  (1.626 m)   Wt 134 lb (60.8 kg)   SpO2 97%   BMI 23.00 kg/m   Physical Exam  Constitutional: She is oriented to person, place, and time. She appears well-developed and well-nourished. No distress.  HENT:  Head: Normocephalic and atraumatic.  Eyes: EOM are normal.  Neck: Normal range of motion.  Cardiovascular: Normal rate, regular rhythm and normal heart sounds.   Pulmonary/Chest: Effort normal and breath sounds normal.  Abdominal: Soft. She exhibits no distension. There is no tenderness.  Musculoskeletal: Normal range of motion.  Neurological: She is alert and oriented to person, place, and time.  Skin: Skin is warm and dry.  Psychiatric: She has a normal mood and affect. Judgment normal.  Nursing note and vitals reviewed.  ED Treatments / Results  DIAGNOSTIC STUDIES:  Oxygen Saturation is 97% on RA, normal by my interpretation.    COORDINATION OF CARE:  12:41 PM Discussed treatment plan with pt at bedside and pt agreed to plan.  Labs (all labs ordered are listed, but only abnormal results are displayed) Labs Reviewed  BASIC METABOLIC PANEL - Abnormal; Notable for the following:       Result Value   Potassium 3.2 (*)    Glucose, Bld 124 (*)    GFR calc non Af Amer 53 (*)    All other components within normal limits  CBC WITH DIFFERENTIAL/PLATELET  TROPONIN I    EKG  EKG Interpretation  Date/Time:  Thursday March 11 2017 12:18:50 EDT Ventricular Rate:  82 PR Interval:    QRS Duration: 101 QT  Interval:  411 QTC Calculation: 480 R Axis:   -32 Text Interpretation:  Sinus rhythm Multiple premature complexes, vent & supraven Left axis deviation Low voltage, precordial leads Abnormal R-wave progression, early transition Baseline wander in lead(s) V3 Confirmed by Juleen China  MD, Hommer Cunliffe [phone]) on 03/11/2017 12:33:19 PM       Radiology Dg Chest Portable 1 View  Result Date: 03/11/2017 CLINICAL DATA:  Chest pain last night and this morning. EXAM: PORTABLE CHEST 1 VIEW COMPARISON:  PA and lateral chest 04/11/2016 and 10/25/2015. FINDINGS: The lungs are clear. No pneumothorax or pleural effusion. Heart size is mildly enlarged. Aortic atherosclerosis is noted. No acute bony abnormality. IMPRESSION: Mild cardiomegaly without acute disease. Atherosclerosis. Electronically Signed   By: Drusilla Kanner M.D.   On: 03/11/2017 12:36    Procedures Procedures (including critical care time)  Medications Ordered in ED Medications - No data to display  Initial Impression / Assessment and Plan / ED Course  I have reviewed the triage vital signs and the nursing notes.  Pertinent labs & imaging results that were available during my care of the patient were reviewed by me and considered in my medical decision making (see chart for details).     89yF with CP, now resolved. Atypical for ACS. Doubt PE, dissection or other emergent process.   I personally preformed the services scribed in my presence. The recorded information has been reviewed is accurate. Raeford Razor, MD.   Final Clinical Impressions(s) / ED Diagnoses   Final diagnoses:  Chest pain, unspecified type    New Prescriptions New Prescriptions   No medications on file     Raeford Razor, MD 03/22/17 [phone]

## 2017-03-11 NOTE — ED Triage Notes (Signed)
Pt with mid CP at 0830 this morning lasting about 10 min, denies any Nausea or SOB.  Denies pain at this time.  No distress noted.

## 2017-04-22 ENCOUNTER — Observation Stay (HOSPITAL_COMMUNITY)
Admission: EM | Admit: 2017-04-22 | Discharge: 2017-04-24 | Disposition: A | Discharge status: Home / Self Care | Payer: Medicare Other | Attending: Family Medicine | Admitting: Family Medicine

## 2017-04-22 ENCOUNTER — Encounter (HOSPITAL_COMMUNITY): Payer: Self-pay | Admitting: Emergency Medicine

## 2017-04-22 DIAGNOSIS — Z79899 Other long term (current) drug therapy: Secondary | ICD-10-CM | POA: Diagnosis not present

## 2017-04-22 DIAGNOSIS — K625 Hemorrhage of anus and rectum: Secondary | ICD-10-CM | POA: Diagnosis present

## 2017-04-22 DIAGNOSIS — K922 Gastrointestinal hemorrhage, unspecified: Principal | ICD-10-CM | POA: Insufficient documentation

## 2017-04-22 DIAGNOSIS — D62 Acute posthemorrhagic anemia: Secondary | ICD-10-CM | POA: Diagnosis present

## 2017-04-22 DIAGNOSIS — I10 Essential (primary) hypertension: Secondary | ICD-10-CM | POA: Diagnosis present

## 2017-04-22 DIAGNOSIS — K5790 Diverticulosis of intestine, part unspecified, without perforation or abscess without bleeding: Secondary | ICD-10-CM | POA: Diagnosis present

## 2017-04-22 DIAGNOSIS — K5731 Diverticulosis of large intestine without perforation or abscess with bleeding: Secondary | ICD-10-CM | POA: Diagnosis not present

## 2017-04-22 LAB — CBC
HCT: 34.7 % — ABNORMAL LOW (ref 36.0–46.0)
Hemoglobin: 11.4 g/dL — ABNORMAL LOW (ref 12.0–15.0)
MCH: 29 pg (ref 26.0–34.0)
MCHC: 32.9 g/dL (ref 30.0–36.0)
MCV: 88.3 fL (ref 78.0–100.0)
PLATELETS: 174 10*3/uL (ref 150–400)
RBC: 3.93 MIL/uL (ref 3.87–5.11)
RDW: 14.1 % (ref 11.5–15.5)
WBC: 5.3 10*3/uL (ref 4.0–10.5)

## 2017-04-22 LAB — COMPREHENSIVE METABOLIC PANEL
ALK PHOS: 45 U/L (ref 38–126)
ALT: 13 U/L — AB (ref 14–54)
AST: 28 U/L (ref 15–41)
Albumin: 3.8 g/dL (ref 3.5–5.0)
Anion gap: 8 (ref 5–15)
BUN: 17 mg/dL (ref 6–20)
CALCIUM: 9.1 mg/dL (ref 8.9–10.3)
CHLORIDE: 105 mmol/L (ref 101–111)
CO2: 25 mmol/L (ref 22–32)
CREATININE: 0.9 mg/dL (ref 0.44–1.00)
GFR calc Af Amer: 59 mL/min — ABNORMAL LOW (ref >60)
GFR, EST NON AFRICAN AMERICAN: 51 mL/min — AB (ref >60)
Glucose, Bld: 142 mg/dL — ABNORMAL HIGH (ref 65–99)
Potassium: 3.3 mmol/L — ABNORMAL LOW (ref 3.5–5.1)
Sodium: 138 mmol/L (ref 135–145)
Total Bilirubin: 0.5 mg/dL (ref 0.3–1.2)
Total Protein: 7.6 g/dL (ref 6.5–8.1)

## 2017-04-22 LAB — TYPE AND SCREEN
ABO/RH(D): A POS
Antibody Screen: NEGATIVE

## 2017-04-22 LAB — HEMATOCRIT: HEMATOCRIT: 35.7 % — AB (ref 36.0–46.0)

## 2017-04-22 LAB — POC OCCULT BLOOD, ED: Fecal Occult Bld: POSITIVE — AB

## 2017-04-22 LAB — HEMOGLOBIN: HEMOGLOBIN: 11.7 g/dL — AB (ref 12.0–15.0)

## 2017-04-22 MED ORDER — ALBUTEROL SULFATE (2.5 MG/3ML) 0.083% IN NEBU: 3.0000 mL | INHALATION_SOLUTION | RESPIRATORY_TRACT | Status: DC | PRN

## 2017-04-22 MED ORDER — LORAZEPAM 0.5 MG PO TABS
ORAL_TABLET | ORAL | Status: AC
  Administered 2017-04-22: 21:00:00
  Filled 2017-04-22: qty 1

## 2017-04-22 MED ORDER — DEXTROSE-NACL 5-0.9 % IV SOLN
INTRAVENOUS | Status: DC
  Administered 2017-04-22 – 2017-04-23 (×2): via INTRAVENOUS

## 2017-04-22 MED ORDER — LATANOPROST 0.005 % OP SOLN
1.0000 [drp] | Freq: Every day | OPHTHALMIC | Status: DC
Start: 2017-04-22 — End: 2017-04-24
  Administered 2017-04-22: 1 [drp] via OPHTHALMIC
  Filled 2017-04-22: qty 2.5

## 2017-04-22 MED ORDER — NIFEDIPINE ER OSMOTIC RELEASE 30 MG PO TB24
90.0000 mg | ORAL_TABLET | Freq: Every morning | ORAL | Status: DC
  Administered 2017-04-23: 90 mg via ORAL
  Filled 2017-04-22: qty 3

## 2017-04-22 MED ORDER — LISINOPRIL 10 MG PO TABS
10.0000 mg | ORAL_TABLET | Freq: Every day | ORAL | Status: DC
  Administered 2017-04-23: 10 mg via ORAL
  Filled 2017-04-22: qty 1

## 2017-04-22 MED ORDER — SODIUM CHLORIDE 0.9 % IV BOLUS (SEPSIS)
500.0000 mL | Freq: Once | INTRAVENOUS | Status: AC
  Administered 2017-04-22: 500 mL via INTRAVENOUS

## 2017-04-22 MED ORDER — LORAZEPAM 0.5 MG PO TABS
0.5000 mg | ORAL_TABLET | ORAL | Status: DC | PRN
  Administered 2017-04-22 – 2017-04-23 (×3): 0.5 mg via ORAL
  Filled 2017-04-22 (×2): qty 1

## 2017-04-22 MED ORDER — SODIUM CHLORIDE 0.9% FLUSH: 3.0000 mL | Freq: Two times a day (BID) | INTRAVENOUS | Status: DC

## 2017-04-22 NOTE — ED Triage Notes (Signed)
Pt a/o. C/o bleeding from rectum started today. C/o nausea. Denies pain.

## 2017-04-22 NOTE — ED Provider Notes (Addendum)
[facility] Provider Note   CSN: [id number] Arrival date & time: 04/22/17  1732     History   Chief Complaint Chief Complaint  Patient presents with  . Rectal Bleeding    HPI Lynn Nichols is a [age] y.o. female.  Level V caveat for slight memory impairment. History obtained from daughter. Daughter reports rectal bleeding staining her underwear and gown earlier this afternoon. Remote history of similar complaint in the past resulting in a diagnosis of "polyp". No abdominal pain, fever, sweats, chills, vomiting, diarrhea. Severity is mild to moderate.      Past Medical History:  Diagnosis Date  . Arthritis   . Back pain   . Diverticulosis   . Headache   . Hypertension     Patient Active Problem List   Diagnosis Date Noted  . Lower GI bleed 04/22/2017  . Hypertension 04/22/2017  . Diverticulosis 04/22/2017  . Acute blood loss anemia 07/05/2014  . Acute lower gastrointestinal bleeding 06/30/2014  . Essential hypertension 06/30/2014  . Acute lower GI bleeding 06/30/2014    Past Surgical History:  Procedure Laterality Date  . ABDOMINAL HYSTERECTOMY    . CHOLECYSTECTOMY    . COLONOSCOPY N/A 07/01/2014   Procedure: COLONOSCOPY;  Surgeon: Malissa HippoNajeeb U Rehman, MD;  Location: [facility];  Service: Endoscopy;  Laterality: N/A;  . COLONOSCOPY N/A 07/05/2014   Procedure: COLONOSCOPY;  Surgeon: Malissa HippoNajeeb U Rehman, MD;  Location: [facility];  Service: Endoscopy;  Laterality: N/A;    OB History    Gravida Para Term Preterm AB Living   14 13 13   1 6    SAB TAB Ectopic Multiple Live Births   1               Home Medications    Prior to Admission medications   Medication Sig Start Date End Date Taking? Authorizing Provider  albuterol (PROAIR HFA) 108 (90 BASE) MCG/ACT inhaler Inhale 2 puffs into the lungs every 6 (six) hours as needed. For shortness of breath   Yes [provider]  Camphor-Eucalyptus-Menthol (VICKS VAPORUB EX) Apply 1 application  topically daily as needed (pain).   Yes [provider]  latanoprost (XALATAN) 0.005 % ophthalmic solution Place 1 drop into both eyes at bedtime.  09/26/15  Yes [provider]  lisinopril (PRINIVIL,ZESTRIL) 10 MG tablet Take 10 mg by mouth daily.    Yes [provider]  Menthol-Methyl Salicylate (MUSCLE RUB) 10-15 % CREA Apply 1 application topically as needed for muscle pain.   Yes [provider]  montelukast (SINGULAIR) 10 MG tablet Take 1 tablet by mouth daily. 01/07/17  Yes [provider]  NIFEdipine (PROCARDIA XL/ADALAT-CC) 90 MG 24 hr tablet Take 90 mg by mouth every morning.    Yes [provider]  traMADol (ULTRAM) 50 MG tablet Take 50 mg by mouth 3 (three) times daily as needed for moderate pain.    Yes [provider]  neomycin-polymyxin-hydrocortisone (CORTISPORIN) 3.5-10000-1 otic suspension Place 4 drops into the left ear 3 (three) times daily. For 7 days Patient not taking: Reported on 03/11/2017 10/25/16   Pauline Ausriplett, Tammy, PA-C    Family History Family History  Problem Relation Age of Onset  . Hypertension Mother   . Hypertension Father   . Diabetes Other     Social History Social History  Substance Use Topics  . Smoking status: Never Smoker  . Smokeless tobacco: Current User    Types: Snuff  . Alcohol use No  Allergies   Aspirin and Penicillins   Review of Systems Review of Systems  Reason unable to perform ROS: Memory impairment.     Physical Exam Updated Vital Signs BP (!) 155/78   Pulse 70   Temp 98.2 F (36.8 C) (Oral)   Resp 17   Ht 5\' 4"  (1.626 m)   Wt 60.3 kg (133 lb)   SpO2 95%   BMI 22.83 kg/m   Physical Exam  Constitutional: She appears well-developed and well-nourished.  Pleasant, no acute distress  HENT:  Head: Normocephalic and atraumatic.  Eyes: Conjunctivae are normal.  Neck: Neck supple.  Cardiovascular: Normal rate and regular rhythm.   Pulmonary/Chest:  Effort normal and breath sounds normal.  Abdominal: Soft. Bowel sounds are normal.  Genitourinary:  Genitourinary Comments: Rectal exam: No masses, stool in vault, maroon bloody digital exam  Musculoskeletal: Normal range of motion.  Neurological: She is alert.  Skin: Skin is warm and dry.  Psychiatric: She has a normal mood and affect.  Nursing note and vitals reviewed.    ED Treatments / Results  Labs (all labs ordered are listed, but only abnormal results are displayed) Labs Reviewed  COMPREHENSIVE METABOLIC PANEL - Abnormal; Notable for the following:       Result Value   Potassium 3.3 (*)    Glucose, Bld 142 (*)    ALT 13 (*)    GFR calc non Af Amer 51 (*)    GFR calc Af Amer 59 (*)    All other components within normal limits  CBC - Abnormal; Notable for the following:    Hemoglobin 11.4 (*)    HCT 34.7 (*)    All other components within normal limits  POC OCCULT BLOOD, ED - Abnormal; Notable for the following:    Fecal Occult Bld POSITIVE (*)    All other components within normal limits  TYPE AND SCREEN    EKG  EKG Interpretation  Date/Time:  Thursday Apr 22 2017 17:42:41 EDT Ventricular Rate:  88 PR Interval:    QRS Duration: 98 QT Interval:  383 QTC Calculation: 464 R Axis:   -15 Text Interpretation:  Atrial fibrillation Abnormal R-wave progression, early transition Left ventricular hypertrophy When compared with ECG of 03/11/2017 Premature ventricular complexes are no longer present Confirmed by Dione Booze ([id number]) on 04/22/2017 7:26:42 PM       Radiology No results found.  Procedures Procedures (including critical care time)  Medications Ordered in ED Medications  sodium chloride 0.9 % bolus 500 mL (500 mLs Intravenous New Bag/Given 04/22/17 1854)     Initial Impression / Assessment and Plan / ED Course  I have reviewed the triage vital signs and the nursing notes.  Pertinent labs & imaging results that were available during my care of the  patient were reviewed by me and considered in my medical decision making (see chart for details).     Patient presents with rectal bleeding. She is hemodynamically stable. Hemoglobin reasonable. Rectal exam shows maroon/bloody stool. Will admit for workup of lower GI bleed.  Final Clinical Impressions(s) / ED Diagnoses   Final diagnoses:  Lower GI bleed    New Prescriptions New Prescriptions   No medications on file     Lynn Hutching, MD 04/22/17 2026    Lynn Hutching, MD 04/22/17 2026

## 2017-04-22 NOTE — H&P (Signed)
History and Physical    Lynn Nichols ZOX:[id number] DOB: June 28, 1914 DOA: 04/22/2017  PCP: [facility]  Patient coming from:  Home.    Chief Complaint:   Rectal bleeding, painless.   HPI: Lynn Nichols is an 83 y.o. female with hx of diverticulosis, prior rectal bleeding felt to be due to colonic ulcer, s/p colonscopy about 2 years ago by Dr Renae Fickle, hx of arthritis, HTN, DNR code status (reconfirmed tonight), lives at home with her daughters with high functional level, presented to the ER with painless BRBPR and no associated symptoms today.  Work up in the ER included a stable Hb of 11.4 g per dL, platelet count of 811B, normal BUN and Cr.  She doesn't want to have any more colonoscopy unless she " has to".  Hospitalist was asked to admit her for lower GI bleed, suspicious for diverticular bleed.   ED Course:  See above.  Rewiew of Systems:  Constitutional: Negative for malaise, fever and chills. No significant weight loss or weight gain Eyes: Negative for eye pain, redness and discharge, diplopia, visual changes, or flashes of light. ENMT: Negative for ear pain, hoarseness, nasal congestion, sinus pressure and sore throat. No headaches; tinnitus, drooling, or problem swallowing. Cardiovascular: Negative for chest pain, palpitations, diaphoresis, dyspnea and peripheral edema. ; No orthopnea, PND Respiratory: Negative for cough, hemoptysis, wheezing and stridor. No pleuritic chestpain. Gastrointestinal: Negative for diarrhea, constipation,  melena, blood in stool, hematemesis, jaundice and rectal bleeding.    Genitourinary: Negative for frequency, dysuria, incontinence,flank pain and hematuria; Musculoskeletal: Negative for back pain and neck pain. Negative for swelling and trauma.;  Skin: . Negative for pruritus, rash, abrasions, bruising and skin lesion.; ulcerations Neuro: Negative for headache, lightheadedness and neck stiffness. Negative for weakness,  altered level of consciousness , altered mental status, extremity weakness, burning feet, involuntary movement, seizure and syncope.  Psych: negative for anxiety, depression, insomnia, tearfulness, panic attacks, hallucinations, paranoia, suicidal or homicidal ideation   Past Medical History:  Diagnosis Date  . Arthritis   . Back pain   . Diverticulosis   . Headache   . Hypertension     Past Surgical History:  Procedure Laterality Date  . ABDOMINAL HYSTERECTOMY    . CHOLECYSTECTOMY    . COLONOSCOPY N/A 07/01/2014   Procedure: COLONOSCOPY;  Surgeon: Malissa Hippo, MD;  [facility];  Service: Endoscopy;  Laterality: N/A;  . COLONOSCOPY N/A 07/05/2014   Procedure: COLONOSCOPY;  Surgeon: Malissa Hippo, MD;  Location: [facility];  Service: Endoscopy;  Laterality: N/A;     reports that she has never smoked. Her smokeless tobacco use includes Snuff. She reports that she does not drink alcohol or use drugs.  Allergies  Allergen Reactions  . Aspirin     Advised by MD not take due to bleed  . Penicillins Other (See Comments)    Has patient had a PCN reaction causing immediate rash, facial/tongue/throat swelling, SOB or lightheadedness with hypotension: No Has patient had a PCN reaction causing severe rash involving mucus membranes or skin necrosis: No Has patient had a PCN reaction that required hospitalization YES Has patient had a PCN reaction occurring within the last 10 years: No If all of the above answers are "NO", then may proceed with Cephalosporin use. "PASSES OUT"    Family History  Problem Relation Age of Onset  . Hypertension Mother   . Hypertension Father   . Diabetes Other  Prior to Admission medications   Medication Sig Start Date End Date Taking? Authorizing Provider  albuterol (PROAIR HFA) 108 (90 BASE) MCG/ACT inhaler Inhale 2 puffs into the lungs every 6 (six) hours as needed. For shortness of breath   Yes [provider]    Camphor-Eucalyptus-Menthol (VICKS VAPORUB EX) Apply 1 application topically daily as needed (pain).   Yes [provider]  latanoprost (XALATAN) 0.005 % ophthalmic solution Place 1 drop into both eyes at bedtime.  09/26/15  Yes [provider]  lisinopril (PRINIVIL,ZESTRIL) 10 MG tablet Take 10 mg by mouth daily.    Yes [provider]  Menthol-Methyl Salicylate (MUSCLE RUB) 10-15 % CREA Apply 1 application topically as needed for muscle pain.   Yes [provider]  montelukast (SINGULAIR) 10 MG tablet Take 1 tablet by mouth daily. 01/07/17  Yes [provider]  NIFEdipine (PROCARDIA XL/ADALAT-CC) 90 MG 24 hr tablet Take 90 mg by mouth every morning.    Yes [provider]  traMADol (ULTRAM) 50 MG tablet Take 50 mg by mouth 3 (three) times daily as needed for moderate pain.    Yes [provider]  neomycin-polymyxin-hydrocortisone (CORTISPORIN) 3.5-10000-1 otic suspension Place 4 drops into the left ear 3 (three) times daily. For 7 days Patient not taking: Reported on 03/11/2017 10/25/16   Pauline Aus, PA-C    Physical Exam: Vitals:   04/22/17 1739 04/22/17 1800 04/22/17 1830 04/22/17 2009  BP: (!) 168/79 (!) 151/64 (!) 148/75 (!) 155/78  Pulse: 90 82 66 70  Resp: (!) 22 16 17 17   Temp: 98.2 F (36.8 C)     TempSrc: Oral     SpO2: 96% 95% 98% 95%  Weight:      Height:          Constitutional: NAD, calm, comfortable Vitals:   04/22/17 1739 04/22/17 1800 04/22/17 1830 04/22/17 2009  BP: (!) 168/79 (!) 151/64 (!) 148/75 (!) 155/78  Pulse: 90 82 66 70  Resp: (!) 22 16 17 17   Temp: 98.2 F (36.8 C)     TempSrc: Oral     SpO2: 96% 95% 98% 95%  Weight:      Height:       Eyes: PERRL, lids and conjunctivae normal ENMT: Mucous membranes are moist. Posterior pharynx clear of any exudate or lesions.Normal dentition.  Neck: normal, supple, no masses, no thyromegaly Respiratory: clear to auscultation bilaterally, no  wheezing, no crackles. Normal respiratory effort. No accessory muscle use.  Cardiovascular: Regular rate and rhythm, no murmurs / rubs / gallops. No extremity edema. 2+ pedal pulses. No carotid bruits.  Abdomen: no tenderness, no masses palpated. No hepatosplenomegaly. Bowel sounds positive.  Musculoskeletal: no clubbing / cyanosis. No joint deformity upper and lower extremities. Good ROM, no contractures. Normal muscle tone.  Skin: no rashes, lesions, ulcers. No induration Neurologic: CN 2-12 grossly intact. Sensation intact, DTR normal. Strength 5/5 in all 4.  Psychiatric: Normal judgment and insight. Alert and oriented x 3. Normal mood.   Labs on Admission: I have personally reviewed following labs and imaging studies CBC:  Recent Labs Lab 04/22/17 1801  WBC 5.3  HGB 11.4*  HCT 34.7*  MCV 88.3  PLT 174   Basic Metabolic Panel:  Recent Labs Lab 04/22/17 1801  NA 138  K 3.3*  CL 105  CO2 25  GLUCOSE 142*  BUN 17  CREATININE 0.90  CALCIUM 9.1   GFR: Estimated Creatinine Clearance: 28.7 mL/min (by C-G formula based on SCr of  0.9 mg/dL). Liver Function Tests:  Recent Labs Lab 04/22/17 1801  AST 28  ALT 13*  ALKPHOS 45  BILITOT 0.5  PROT 7.6  ALBUMIN 3.8   Urine analysis:    Component Value Date/Time   COLORURINE YELLOW 10/25/2015 2125   APPEARANCEUR CLEAR 10/25/2015 2125   LABSPEC 1.010 10/25/2015 2125   PHURINE 6.0 10/25/2015 2125   GLUCOSEU NEGATIVE 10/25/2015 2125   HGBUR NEGATIVE 10/25/2015 2125   BILIRUBINUR NEGATIVE 10/25/2015 2125   KETONESUR NEGATIVE 10/25/2015 2125   PROTEINUR NEGATIVE 10/25/2015 2125   UROBILINOGEN 0.2 02/17/2015 1502   NITRITE NEGATIVE 10/25/2015 2125   LEUKOCYTESUR NEGATIVE 10/25/2015 2125    EKG: Independently reviewed.  Assessment/Plan Principal Problem:   Lower GI bleed Active Problems:   Acute lower GI bleeding   Hypertension   Diverticulosis   PLAN:   Lower GI Bleed:  At [age] yo, and she doesn't want to have  another colonoscopy, I think it is quite reasonable.  Will admit her and give clear liquid, check H and H every 6 hours, and transfuse as required.  IF she has further bleeding, she is agreeable to having lower endoscopy if required.   With no pain, and hx of diverticular disease, I suspect she has diverticular bleed.  Her hemodynamics were stable, and Will continue with her anti HTN meds.    HTN:  Her bleeding is minimal, and I will continue with her anti HTN meds.  Will give her IVF at 50cc per hour only.  DVT prophylaxis: SCD Code Status: DNR  (reconfirmed) Family Communication: daughter.  Disposition Plan: To home likely.  Consults called: None Admission status: OBS>    Lynn Theall MD FACP. [facility]  If 7PM-7AM, please contact night-coverage [email or web address] Password [email or web address]  04/22/2017, 8:15 PM

## 2017-04-23 DIAGNOSIS — I10 Essential (primary) hypertension: Secondary | ICD-10-CM | POA: Diagnosis not present

## 2017-04-23 DIAGNOSIS — K5731 Diverticulosis of large intestine without perforation or abscess with bleeding: Secondary | ICD-10-CM | POA: Diagnosis not present

## 2017-04-23 DIAGNOSIS — K922 Gastrointestinal hemorrhage, unspecified: Secondary | ICD-10-CM | POA: Diagnosis not present

## 2017-04-23 DIAGNOSIS — D62 Acute posthemorrhagic anemia: Secondary | ICD-10-CM | POA: Diagnosis not present

## 2017-04-23 LAB — HEMOGLOBIN: Hemoglobin: 11.2 g/dL — ABNORMAL LOW (ref 12.0–15.0)

## 2017-04-23 LAB — BASIC METABOLIC PANEL
ANION GAP: 6 (ref 5–15)
BUN: 16 mg/dL (ref 6–20)
CO2: 26 mmol/L (ref 22–32)
Calcium: 8.4 mg/dL — ABNORMAL LOW (ref 8.9–10.3)
Chloride: 107 mmol/L (ref 101–111)
Creatinine, Ser: 0.74 mg/dL (ref 0.44–1.00)
GFR calc Af Amer: >60 mL/min (ref >60)
Glucose, Bld: 112 mg/dL — ABNORMAL HIGH (ref 65–99)
POTASSIUM: 3.3 mmol/L — AB (ref 3.5–5.1)
SODIUM: 139 mmol/L (ref 135–145)

## 2017-04-23 LAB — HEMATOCRIT: HCT: 33.9 % — ABNORMAL LOW (ref 36.0–46.0)

## 2017-04-23 LAB — CBC
HCT: 30.9 % — ABNORMAL LOW (ref 36.0–46.0)
HEMOGLOBIN: 10.2 g/dL — AB (ref 12.0–15.0)
MCH: 29.4 pg (ref 26.0–34.0)
MCHC: 33 g/dL (ref 30.0–36.0)
MCV: 89 fL (ref 78.0–100.0)
PLATELETS: 151 10*3/uL (ref 150–400)
RBC: 3.47 MIL/uL — AB (ref 3.87–5.11)
RDW: 14.4 % (ref 11.5–15.5)
WBC: 5.3 10*3/uL (ref 4.0–10.5)

## 2017-04-23 LAB — MAGNESIUM: MAGNESIUM: 1.9 mg/dL (ref 1.7–2.4)

## 2017-04-23 MED ORDER — TRAMADOL HCL 50 MG PO TABS
50.0000 mg | ORAL_TABLET | Freq: Once | ORAL | Status: AC
  Administered 2017-04-23: 50 mg via ORAL
  Filled 2017-04-23: qty 1

## 2017-04-23 MED ORDER — HYDROCODONE-ACETAMINOPHEN 7.5-325 MG PO TABS
2.0000 | ORAL_TABLET | Freq: Once | ORAL | Status: AC
  Administered 2017-04-23: 2 via ORAL
  Filled 2017-04-23: qty 2

## 2017-04-23 MED ORDER — HYDRALAZINE HCL 20 MG/ML IJ SOLN: 10.0000 mg | Freq: Three times a day (TID) | INTRAMUSCULAR | Status: DC | PRN

## 2017-04-23 MED ORDER — POTASSIUM CHLORIDE CRYS ER 20 MEQ PO TBCR
40.0000 meq | EXTENDED_RELEASE_TABLET | Freq: Once | ORAL | Status: AC
  Administered 2017-04-23: 40 meq via ORAL
  Filled 2017-04-23: qty 2

## 2017-04-23 MED ORDER — FENTANYL CITRATE (PF) 100 MCG/2ML IJ SOLN
12.5000 ug | Freq: Once | INTRAMUSCULAR | Status: AC
  Administered 2017-04-23: 12.5 ug via INTRAVENOUS
  Filled 2017-04-23: qty 2

## 2017-04-23 NOTE — Care Management Obs Status (Signed)
MEDICARE OBSERVATION STATUS NOTIFICATION   Patient Details  Name: Lynn Nichols MRN: [id number] Date of Birth: 04/01/1914   Medicare Observation Status Notification Given:  Yes    Rakesha Dalporto, Chrystine OilerSharley Diane, RN 04/23/2017, 9:16 AM

## 2017-04-23 NOTE — Care Management Note (Signed)
Case Management Note  Patient Details  Name: Donnetta Hailnnie H Lemanski MRN: [id number] Date of Birth: 09-22-1914  Subjective/Objective:   Adm from home with possible GIB. Lives with daughter, has 24 hour supervision. Walks with RW, has cane also. No other DME or HH PTA. Daughter states she recently had MD appointment and refused HH. [facility], daughter drives her to appointments. Reports no issues affording medications.           Action/Plan: Anticipate DC home in care of family. Family declines HH needs.    Expected Discharge Date:  04/23/17               Expected Discharge Plan:  Home/Self Care  In-House Referral:     Discharge planning Services  CM Consult  Post Acute Care Choice:    Choice offered to:  NA  DME Arranged:    DME Agency:     HH Arranged:    HH Agency:     Status of Service:  Completed, signed off  If discussed at [employer] Length of Stay Meetings, dates discussed:    Additional Comments:  Deon Duer, Chrystine OilerSharley Diane, RN 04/23/2017, 9:36 AM

## 2017-04-23 NOTE — Progress Notes (Signed)
Patient Demographics:    Lynn Nichols, is a [age] y.o. female, DOB - [birth year], RUE:[id number]  Admit date - 04/22/2017   Admitting Physician Houston SirenPeter Le, MD  Outpatient [facility]  LOS - 0   Chief Complaint  Patient presents with  . Rectal Bleeding        Subjective:    Lynn Nichols today has no fevers, no emesis,  No chest pain,  , no further rectal bleeding, patient's daughter and granddaughter at bedside, questions answered , patient complained of leg cramps earlier potassium is noted to be low  Assessment  & Plan :    Principal Problem:   Lower GI bleed Active Problems:   Acute lower GI bleeding   Hypertension   Diverticulosis   1)Lower GI Bleed:   history of recurrent diverticular bleed, patient and family would rather not have endoluminal evaluation at this time, hemoglobin is stable above 11, continue to monitor closely  2)HTN:  Given Acute GI bleed we will Allow some permissive Hypertension due to acute stroke, avoid very aggressive, rapid BP control at this time. Hold Procardia and lisinopril,   may use IV Hydralazine 10 mg  Every 4 hours Prn for systolic blood pressure over 180 mmhg  3)Acute blood loss anemia- hemoglobin is stable above 11 at this time, please see #1 above  4)Hypokalemia- replace and recheck potassium, serum magnesium is normal  DVT prophylaxis: SCD Code Status: DNR  Family Communication: daughter and grand daughter.  Disposition Plan: To home likely.  Consults called: None   Lab Results  Component Value Date   PLT 174 04/22/2017    Inpatient Medications  Scheduled Meds: . latanoprost  1 drop Both Eyes QHS  . potassium chloride  40 mEq Oral Once  . sodium chloride flush  3 mL Intravenous Q12H   Continuous Infusions: . dextrose 5 % and 0.9% NaCl 50 mL/hr at 04/22/17 2350   PRN  Meds:.albuterol, hydrALAZINE, LORazepam    Anti-infectives    None        Objective:   Vitals:   04/22/17 2009 04/22/17 2212 04/23/17 0700 04/23/17 0939  BP: (!) 155/78 (!) 159/58    Pulse: 70 68 72   Resp: 17 16 17    Temp:  98.4 F (36.9 C) 98.5 F (36.9 C)   TempSrc:  Oral Oral   SpO2: 95% 99% 99% 95%  Weight:  58.7 kg (129 lb 6.6 oz)    Height:  5\' 4"  (1.626 m)      Wt Readings from Last 3 Encounters:  04/22/17 58.7 kg (129 lb 6.6 oz)  03/11/17 60.8 kg (134 lb)  10/25/16 59 kg (130 lb)     Intake/Output Summary (Last 24 hours) at 04/23/17 1235 Last data filed at 04/23/17 0308  Gross per 24 hour  Intake              165 ml  Output                0 ml  Net              165 ml     Physical Exam  Gen:- Awake Alert,  In no apparent distress  HEENT:- [place].AT, No sclera icterus Neck-Supple Neck,No JVD,.  Lungs-  CTAB  CV- S1, S2 normal Abd-  +ve B.Sounds, Abd Soft, No tenderness,    Extremity/Skin:- No  edema,       Data Review:   Micro Results No results found for this or any previous visit (from the past 240 hour(s)).  Radiology Reports No results found.   CBC  Recent Labs Lab 04/22/17 1801 04/22/17 2321 04/23/17 0511  WBC 5.3  --   --   HGB 11.4* 11.7* 11.2*  HCT 34.7* 35.7* 33.9*  PLT 174  --   --   MCV 88.3  --   --   MCH 29.0  --   --   MCHC 32.9  --   --   RDW 14.1  --   --     Chemistries   Recent Labs Lab 04/22/17 1801 04/23/17 0511  NA 138 139  K 3.3* 3.3*  CL 105 107  CO2 25 26  GLUCOSE 142* 112*  BUN 17 16  CREATININE 0.90 0.74  CALCIUM 9.1 8.4*  MG  --  1.9  AST 28  --   ALT 13*  --   ALKPHOS 45  --   BILITOT 0.5  --    ------------------------------------------------------------------------------------------------------------------ No results for input(s): CHOL, HDL, LDLCALC, TRIG, CHOLHDL, LDLDIRECT in the last 72 hours.  No results found for:  HGBA1C ------------------------------------------------------------------------------------------------------------------ No results for input(s): TSH, T4TOTAL, T3FREE, THYROIDAB in the last 72 hours.  Invalid input(s): FREET3 ------------------------------------------------------------------------------------------------------------------ No results for input(s): VITAMINB12, FOLATE, FERRITIN, TIBC, IRON, RETICCTPCT in the last 72 hours.  Coagulation profile No results for input(s): INR, PROTIME in the last 168 hours.  No results for input(s): DDIMER in the last 72 hours.  Cardiac Enzymes No results for input(s): CKMB, TROPONINI, MYOGLOBIN in the last 168 hours.  Invalid input(s): CK ------------------------------------------------------------------------------------------------------------------ No results found for: BNP   Silvia Markuson M.D on 04/23/2017 at 12:35 PM  Between 7am to 7pm - Pager - [phone]  After 7pm go to [email or web address] - password [email or web address]  [facility] -  Office  [phone]   Voice Recognition Reubin Milan dictation system was used to create this note, attempts have been made to correct errors. Please contact the author with questions and/or clarifications.

## 2017-04-23 NOTE — Progress Notes (Signed)
Patient complaining of leg cramps through out the night.  The on-call MD notified at approximately 0015.  Tramadol X 1 ordered, given and did not work.  Notified the on-call MD for a second time approximately 0215 and Fentanyl X 1 ordered and given.  Patient currently complaining of leg cramps for a third time.  On-call MD notified.  Was advised by MD to let the next shift doctors address this.  Will continue to monitor the patient.

## 2017-04-24 DIAGNOSIS — D62 Acute posthemorrhagic anemia: Secondary | ICD-10-CM

## 2017-04-24 DIAGNOSIS — K5731 Diverticulosis of large intestine without perforation or abscess with bleeding: Secondary | ICD-10-CM | POA: Diagnosis not present

## 2017-04-24 DIAGNOSIS — K922 Gastrointestinal hemorrhage, unspecified: Secondary | ICD-10-CM | POA: Diagnosis not present

## 2017-04-24 DIAGNOSIS — I10 Essential (primary) hypertension: Secondary | ICD-10-CM

## 2017-04-24 LAB — CBC
HCT: 30.3 % — ABNORMAL LOW (ref 36.0–46.0)
Hemoglobin: 9.9 g/dL — ABNORMAL LOW (ref 12.0–15.0)
MCH: 29.2 pg (ref 26.0–34.0)
MCHC: 32.7 g/dL (ref 30.0–36.0)
MCV: 89.4 fL (ref 78.0–100.0)
PLATELETS: 168 10*3/uL (ref 150–400)
RBC: 3.39 MIL/uL — AB (ref 3.87–5.11)
RDW: 14.3 % (ref 11.5–15.5)
WBC: 3.9 10*3/uL — AB (ref 4.0–10.5)

## 2017-04-24 LAB — BASIC METABOLIC PANEL
ANION GAP: 5 (ref 5–15)
BUN: 15 mg/dL (ref 6–20)
CO2: 25 mmol/L (ref 22–32)
Calcium: 8.5 mg/dL — ABNORMAL LOW (ref 8.9–10.3)
Chloride: 108 mmol/L (ref 101–111)
Creatinine, Ser: 0.8 mg/dL (ref 0.44–1.00)
GFR, EST NON AFRICAN AMERICAN: 59 mL/min — AB (ref >60)
Glucose, Bld: 90 mg/dL (ref 65–99)
Potassium: 4.2 mmol/L (ref 3.5–5.1)
Sodium: 138 mmol/L (ref 135–145)

## 2017-04-24 MED ORDER — FERROUS SULFATE 325 (65 FE) MG PO TBEC: 325.0000 mg | DELAYED_RELEASE_TABLET | Freq: Two times a day (BID) | ORAL | 3 refills | Status: AC

## 2017-04-24 NOTE — Discharge Summary (Signed)
Lynn Nichols, is a 83 y.o. female  DOB 09-10-1914  MRN [id number].  Admission date:  04/22/2017  Admitting Physician  Houston Siren, MD  Discharge Date:  04/24/2017   Primary MD  [facility]  Recommendations for primary care physician for things to follow:   Repeat CBC on 04/27/17   Admission Diagnosis  Lower GI bleed [K92.2]   Discharge Diagnosis  Lower GI bleed [K92.2]    Principal Problem:   Acute lower GI bleeding Active Problems:   Acute blood loss anemia   Hypertension   Diverticulosis      Past Medical History:  Diagnosis Date  . Arthritis   . Back pain   . Diverticulosis   . Headache   . Hypertension     Past Surgical History:  Procedure Laterality Date  . ABDOMINAL HYSTERECTOMY    . CHOLECYSTECTOMY    . COLONOSCOPY N/A 07/01/2014   Procedure: COLONOSCOPY;  Surgeon: Malissa Hippo, MD;  Location: [facility];  Service: Endoscopy;  Laterality: N/A;  . COLONOSCOPY N/A 07/05/2014   Procedure: COLONOSCOPY;  Surgeon: Malissa Hippo, MD;  Location: [facility];  Service: Endoscopy;  Laterality: N/A;       HPI  from the history and physical done on the day of admission:     Chief Complaint:   Rectal bleeding, painless.   HPI: Lynn Nichols is an 83 y.o. female with hx of diverticulosis, prior rectal bleeding felt to be due to colonic ulcer, s/p colonscopy about 2 years ago by Dr Renae Fickle, hx of arthritis, HTN, DNR code status (reconfirmed tonight), lives at home with her daughters with high functional level, presented to the ER with painless BRBPR and no associated symptoms today.  Work up in the ER included a stable Hb of 11.4 g per dL, platelet count of 621H, normal BUN and Cr.  She doesn't want to have any more colonoscopy unless she " has to".  Hospitalist was asked to admit her for lower GI bleed, suspicious for diverticular bleed.   ED  Course:  See above.    Hospital Course:     1)Lower GI Bleed:  history of recurrent diverticular bleed, patient and family would rather not have endoluminal evaluation at this time, hemoglobin is stable around 10 after hydration, patient is hemodynamically stable, advised to repeat CBC as outpatient with PCP on 04/27/2017  2)HTN:   stable, okay to restart Procardia and lisinopril,   3)Acute blood loss anemia- hemoglobin is stable around 10 at this time, please see #1 above. Take iron pills as prescribed  4)Hypokalemia- resolved with replacement,  serum magnesium is normal   Discharge Condition: Stable  Follow UP  Follow-up Information    [facility] Follow up.   Why:  Repeat CBC with PCP on 04/27/2017 Contact information: [address] [place] [address] [phone]            Diet and Activity recommendation:  As advised  Discharge Instructions  Repeat CBC with PCP on 04/27/2017 Take iron tablets as prescribed Call or return if any concerns about excessive bleeding Please note that iron tablets will make your stool look dark/black Avoid ibuprofen/Advil/Aleve/Motrin/Goody Powders/Naproxen/BC powders as these will make you more likely to bleed  Discharge Instructions    Call MD for:  difficulty breathing, headache or visual disturbances    Complete by:  As directed    Call MD for:  persistant dizziness or light-headedness    Complete by:  As directed    Call MD for:  persistant nausea and vomiting    Complete by:  As directed    Call MD for:  severe uncontrolled pain    Complete by:  As directed    Call MD for:  temperature >100.4    Complete by:  As directed    Diet general    Complete by:  As directed    Discharge instructions    Complete by:  As directed    Repeat CBC with PCP on 04/27/2017 Take iron tablets as prescribed Call or return if any concerns about excessive bleeding Please note that iron tablets will make your  stool look dark/black Avoid ibuprofen/Advil/Aleve/Motrin/Goody Powders/Naproxen/BC powders as these will make you more likely to bleed   Increase activity slowly    Complete by:  As directed         Discharge Medications     Allergies as of 04/24/2017      Reactions   Aspirin    Advised by MD not take due to bleed   Penicillins Other (See Comments)   Has patient had a PCN reaction causing immediate rash, facial/tongue/throat swelling, SOB or lightheadedness with hypotension: No Has patient had a PCN reaction causing severe rash involving mucus membranes or skin necrosis: No Has patient had a PCN reaction that required hospitalization YES Has patient had a PCN reaction occurring within the last 10 years: No If all of the above answers are "NO", then may proceed with Cephalosporin use. "PASSES OUT"      Medication List    TAKE these medications   ferrous sulfate 325 (65 FE) MG EC tablet Take 1 tablet (325 mg total) by mouth 2 (two) times daily with a meal.   latanoprost 0.005 % ophthalmic solution Commonly known as:  XALATAN Place 1 drop into both eyes at bedtime.   lisinopril 10 MG tablet Commonly known as:  PRINIVIL,ZESTRIL Take 10 mg by mouth daily.   montelukast 10 MG tablet Commonly known as:  SINGULAIR Take 1 tablet by mouth daily.   MUSCLE RUB 10-15 % Crea Apply 1 application topically as needed for muscle pain.   neomycin-polymyxin-hydrocortisone 3.5-10000-1 otic suspension Commonly known as:  CORTISPORIN Place 4 drops into the left ear 3 (three) times daily. For 7 days   NIFEdipine 90 MG 24 hr tablet Commonly known as:  PROCARDIA XL/ADALAT-CC Take 90 mg by mouth every morning.   PROAIR HFA 108 (90 Base) MCG/ACT inhaler Generic drug:  albuterol Inhale 2 puffs into the lungs every 6 (six) hours as needed. For shortness of breath   traMADol 50 MG tablet Commonly known as:  ULTRAM Take 50 mg by mouth 3 (three) times daily as needed for moderate pain.     VICKS VAPORUB EX Apply 1 application topically daily as needed (pain).       Major procedures and Radiology Reports - PLEASE review detailed and final reports for all details, in brief -    No results found.  Micro Results   No results found for this or any previous visit (from the past 240 hour(s)).     Today   Subjective    Lynn Nichols today has no fevers, no emesis,  No chest pain,  , no further rectal bleeding, patient's daughter and granddaughter at bedside, questions answered ,          Patient has been seen and examined prior to discharge   Objective   Blood pressure (!) 149/58, pulse 64, temperature 98.6 F (37 C), temperature source Oral, resp. rate 17, height 5\' 4"  (1.626 m), weight 58.7 kg (129 lb 6.6 oz), SpO2 100 %.   Intake/Output Summary (Last 24 hours) at 04/24/17 1034 Last data filed at 04/24/17 0854  Gross per 24 hour  Intake              600 ml  Output             1350 ml  Net             -750 ml    Exam Gen:- Awake  In no apparent distress  HEENT:- Ama.AT,   Neck-Supple Neck,No JVD,  Lungs- mostly clear  CV- S1, S2 normal Abd-  +ve B.Sounds, Abd Soft, No tenderness,    Extremity/Skin:- Intact peripheral pulses     Data Review   CBC w Diff: Lab Results  Component Value Date   WBC 3.9 (L) 04/24/2017   HGB 9.9 (L) 04/24/2017   HCT 30.3 (L) 04/24/2017   PLT 168 04/24/2017   LYMPHOPCT 32 03/11/2017   MONOPCT 7 03/11/2017   EOSPCT 3 03/11/2017   BASOPCT 0 03/11/2017    CMP: Lab Results  Component Value Date   NA 138 04/24/2017   K 4.2 04/24/2017   CL 108 04/24/2017   CO2 25 04/24/2017   BUN 15 04/24/2017   CREATININE 0.80 04/24/2017   PROT 7.6 04/22/2017   ALBUMIN 3.8 04/22/2017   BILITOT 0.5 04/22/2017   ALKPHOS 45 04/22/2017   AST 28 04/22/2017   ALT 13 (L) 04/22/2017  .   Total Discharge time is about 33 minutes  Skarleth Delmonico M.D on 04/24/2017 at 10:34 AM  [facility]   Office  [phone]  Voice  Recognition Reubin Milan/Dragon dictation system was used to create this note, attempts have been made to correct errors. Please contact the author with questions and/or clarifications.

## 2017-04-24 NOTE — Discharge Instructions (Signed)
Repeat CBC with PCP on 04/27/2017 Take iron tablets as prescribed Call or return if any concerns about excessive bleeding Please note that iron tablets will make your stool look dark/black Avoid ibuprofen/Advil/Aleve/Motrin/Goody Powders/Naproxen/BC powders as these will make you more likely to bleed

## 2017-04-24 NOTE — Progress Notes (Signed)
Patient is to be discharged home and in stable condition. Patient and family present for discharge teaching and verbalize understanding. Patient will be escorted out by staff via wheelchair.  Quita SkyeMorgan P Dishmon, RN

## 2017-04-25 ENCOUNTER — Encounter (HOSPITAL_COMMUNITY): Payer: Self-pay | Admitting: Emergency Medicine

## 2017-04-25 ENCOUNTER — Observation Stay (HOSPITAL_COMMUNITY)
Admission: EM | Admit: 2017-04-25 | Discharge: 2017-04-27 | Disposition: A | Discharge status: Home / Self Care | Payer: Medicare Other | Attending: Internal Medicine | Admitting: Internal Medicine

## 2017-04-25 DIAGNOSIS — F17290 Nicotine dependence, other tobacco product, uncomplicated: Secondary | ICD-10-CM | POA: Insufficient documentation

## 2017-04-25 DIAGNOSIS — Z79899 Other long term (current) drug therapy: Secondary | ICD-10-CM | POA: Insufficient documentation

## 2017-04-25 DIAGNOSIS — K625 Hemorrhage of anus and rectum: Secondary | ICD-10-CM | POA: Diagnosis present

## 2017-04-25 DIAGNOSIS — K5790 Diverticulosis of intestine, part unspecified, without perforation or abscess without bleeding: Secondary | ICD-10-CM | POA: Diagnosis present

## 2017-04-25 DIAGNOSIS — I10 Essential (primary) hypertension: Secondary | ICD-10-CM | POA: Insufficient documentation

## 2017-04-25 DIAGNOSIS — Z515 Encounter for palliative care: Secondary | ICD-10-CM

## 2017-04-25 DIAGNOSIS — K922 Gastrointestinal hemorrhage, unspecified: Secondary | ICD-10-CM | POA: Diagnosis not present

## 2017-04-25 DIAGNOSIS — Z7189 Other specified counseling: Secondary | ICD-10-CM

## 2017-04-25 LAB — COMPREHENSIVE METABOLIC PANEL
ALK PHOS: 47 U/L (ref 38–126)
ALT: 18 U/L (ref 14–54)
ANION GAP: 7 (ref 5–15)
AST: 30 U/L (ref 15–41)
Albumin: 3.7 g/dL (ref 3.5–5.0)
BILIRUBIN TOTAL: 0.3 mg/dL (ref 0.3–1.2)
BUN: 17 mg/dL (ref 6–20)
CALCIUM: 9.2 mg/dL (ref 8.9–10.3)
CO2: 26 mmol/L (ref 22–32)
Chloride: 106 mmol/L (ref 101–111)
Creatinine, Ser: 1.02 mg/dL — ABNORMAL HIGH (ref 0.44–1.00)
GFR, EST AFRICAN AMERICAN: 51 mL/min — AB (ref >60)
GFR, EST NON AFRICAN AMERICAN: 44 mL/min — AB (ref >60)
GLUCOSE: 124 mg/dL — AB (ref 65–99)
POTASSIUM: 3.9 mmol/L (ref 3.5–5.1)
Sodium: 139 mmol/L (ref 135–145)
TOTAL PROTEIN: 7.4 g/dL (ref 6.5–8.1)

## 2017-04-25 LAB — TYPE AND SCREEN
ABO/RH(D): A POS
ANTIBODY SCREEN: NEGATIVE

## 2017-04-25 LAB — CBC
HEMATOCRIT: 33.1 % — AB (ref 36.0–46.0)
HEMOGLOBIN: 10.8 g/dL — AB (ref 12.0–15.0)
MCH: 28.8 pg (ref 26.0–34.0)
MCHC: 32.6 g/dL (ref 30.0–36.0)
MCV: 88.3 fL (ref 78.0–100.0)
Platelets: 178 10*3/uL (ref 150–400)
RBC: 3.75 MIL/uL — ABNORMAL LOW (ref 3.87–5.11)
RDW: 14.2 % (ref 11.5–15.5)
WBC: 5.9 10*3/uL (ref 4.0–10.5)

## 2017-04-25 LAB — POC OCCULT BLOOD, ED: Fecal Occult Bld: POSITIVE — AB

## 2017-04-25 MED ORDER — LATANOPROST 0.005 % OP SOLN
1.0000 [drp] | Freq: Every day | OPHTHALMIC | Status: DC
  Administered 2017-04-26: 1 [drp] via OPHTHALMIC
  Filled 2017-04-25: qty 2.5

## 2017-04-25 MED ORDER — MONTELUKAST SODIUM 10 MG PO TABS
10.0000 mg | ORAL_TABLET | Freq: Every day | ORAL | Status: DC
  Administered 2017-04-26 – 2017-04-27 (×2): 10 mg via ORAL
  Filled 2017-04-25 (×2): qty 1

## 2017-04-25 MED ORDER — FERROUS SULFATE 325 (65 FE) MG PO TABS
325.0000 mg | ORAL_TABLET | Freq: Two times a day (BID) | ORAL | Status: DC
  Administered 2017-04-26 – 2017-04-27 (×3): 325 mg via ORAL
  Filled 2017-04-25 (×3): qty 1

## 2017-04-25 MED ORDER — SODIUM CHLORIDE 0.9 % IV BOLUS (SEPSIS)
500.0000 mL | Freq: Once | INTRAVENOUS | Status: AC
  Administered 2017-04-25: 500 mL via INTRAVENOUS

## 2017-04-25 MED ORDER — ALBUTEROL SULFATE (2.5 MG/3ML) 0.083% IN NEBU: 3.0000 mL | INHALATION_SOLUTION | Freq: Four times a day (QID) | RESPIRATORY_TRACT | Status: DC | PRN

## 2017-04-25 NOTE — ED Triage Notes (Signed)
Recently d/c from hosp for rectal bleeding.  Moderate amount bright red/ dark red blood when having bowel movement. Noticed last night.

## 2017-04-25 NOTE — ED Provider Notes (Signed)
[facility] Provider Note   CSN: [id number] Arrival date & time: 04/25/17  1941  By signing my name below, I, Bing Neighbors., attest that this documentation has been prepared under the direction and in the presence of Donnetta Hutching, MD. Electronically signed: Bing Neighbors., ED Scribe. 04/25/17. 9:33 PM.   History   Chief Complaint Chief Complaint  Patient presents with  . Rectal Bleeding    HPI Lynn Nichols is a 83 y.o. female who presents to the Emergency Department complaining of rectal bleeding with onset x1 day. Pt was admitted x3 days ago for rectal bleeding and observed with no interventions while in the hospital. Daughter states that pt did not have a BM while staying in the ED but has had x3 episodes of rectal bleeding since she was admitted x1 day ago. Pt has been eating well and is alert. Daughter reports pt having abdominal cramping, lightheadedness. Daughter denies any modifying factors. Daughter denies pt having nausea, vomiting. Of note, pt's PCP is at [facility] center.   The history is provided by a relative. No language interpreter was used.  Rectal Bleeding  Quality:  Maroon Amount:  Scant Duration:  1 day Timing:  Constant Chronicity:  Recurrent Context: defecation   Similar prior episodes: yes   Relieved by:  None tried Worsened by:  Nothing Ineffective treatments:  None tried Associated symptoms: light-headedness   Associated symptoms: no fever and no vomiting     Past Medical History:  Diagnosis Date  . Arthritis   . Back pain   . Diverticulosis   . Headache   . Hypertension     Patient Active Problem List   Diagnosis Date Noted  . Lower GI bleed 04/22/2017  . Hypertension 04/22/2017  . Diverticulosis 04/22/2017  . Acute blood loss anemia 07/05/2014  . Acute lower gastrointestinal bleeding 06/30/2014  . Essential hypertension 06/30/2014  . Acute lower GI bleeding 06/30/2014    Past Surgical History:    Procedure Laterality Date  . ABDOMINAL HYSTERECTOMY    . CHOLECYSTECTOMY    . COLONOSCOPY N/A 07/01/2014   Procedure: COLONOSCOPY;  Surgeon: Malissa Hippo, MD;  [facility];  Service: Endoscopy;  Laterality: N/A;  . COLONOSCOPY N/A 07/05/2014   Procedure: COLONOSCOPY;  Surgeon: Malissa Hippo, MD;  Location: [facility];  Service: Endoscopy;  Laterality: N/A;    OB History    Gravida Para Term Preterm AB Living   14 13 13   1 6    SAB TAB Ectopic Multiple Live Births   1               Home Medications    Prior to Admission medications   Medication Sig Start Date End Date Taking? Authorizing Provider  albuterol (PROAIR HFA) 108 (90 BASE) MCG/ACT inhaler Inhale 2 puffs into the lungs every 6 (six) hours as needed. For shortness of breath    [provider]  Camphor-Eucalyptus-Menthol (VICKS VAPORUB EX) Apply 1 application topically daily as needed (pain).    [provider]  ferrous sulfate 325 (65 FE) MG EC tablet Take 1 tablet (325 mg total) by mouth 2 (two) times daily with a meal. 04/24/17   Emokpae, Courage, MD  latanoprost (XALATAN) 0.005 % ophthalmic solution Place 1 drop into both eyes at bedtime.  09/26/15   [provider]  lisinopril (PRINIVIL,ZESTRIL) 10 MG tablet Take 10 mg by mouth daily.     [provider]  Menthol-Methyl Salicylate (  MUSCLE RUB) 10-15 % CREA Apply 1 application topically as needed for muscle pain.    [provider]  montelukast (SINGULAIR) 10 MG tablet Take 1 tablet by mouth daily. 01/07/17   [provider]  neomycin-polymyxin-hydrocortisone (CORTISPORIN) 3.5-10000-1 otic suspension Place 4 drops into the left ear 3 (three) times daily. For 7 days Patient not taking: Reported on 03/11/2017 10/25/16   Pauline Ausriplett, Tammy, PA-C  NIFEdipine (PROCARDIA XL/ADALAT-CC) 90 MG 24 hr tablet Take 90 mg by mouth every morning.     [provider]  traMADol (ULTRAM) 50 MG tablet Take 50 mg by  mouth 3 (three) times daily as needed for moderate pain.     [provider]    Family History Family History  Problem Relation Age of Onset  . Hypertension Mother   . Hypertension Father   . Diabetes Other     Social History Social History  Substance Use Topics  . Smoking status: Never Smoker  . Smokeless tobacco: Current User    Types: Snuff  . Alcohol use No     Allergies   Aspirin and Penicillins   Review of Systems Review of Systems  Constitutional: Negative for fever.  Gastrointestinal: Positive for blood in stool and hematochezia. Negative for vomiting.  Neurological: Positive for light-headedness.  All other systems reviewed and are negative.    Physical Exam Updated Vital Signs BP (!) 169/66   Pulse 82   Resp 17   Ht 5\' 4"  (1.626 m)   Wt 58.5 kg (129 lb)   SpO2 96%   BMI 22.14 kg/m   Physical Exam  Constitutional: She is oriented to person, place, and time. She appears well-developed and well-nourished.  HENT:  Head: Normocephalic and atraumatic.  Eyes: Conjunctivae are normal.  Neck: Neck supple.  Cardiovascular: Normal rate and regular rhythm.   Pulmonary/Chest: Effort normal and breath sounds normal.  Abdominal: Soft. Bowel sounds are normal.  Genitourinary:  Genitourinary Comments: Bloody/Maroon stool on the glove.  Musculoskeletal: Normal range of motion.  Neurological: She is alert and oriented to person, place, and time.  Skin: Skin is warm and dry.  Psychiatric: She has a normal mood and affect. Her behavior is normal.  Nursing note and vitals reviewed.    ED Treatments / Results   DIAGNOSTIC STUDIES: Oxygen Saturation is 96% normal by my interpretation.   COORDINATION OF CARE: 9:33 PM- We will check pt's blood work and readmit pt if there are any abnormalities.   Labs (all labs ordered are listed, but only abnormal results are displayed) Labs Reviewed  CBC - Abnormal; Notable for the following:       Result Value    RBC 3.75 (*)    Hemoglobin 10.8 (*)    HCT 33.1 (*)    All other components within normal limits  POC OCCULT BLOOD, ED - Abnormal; Notable for the following:    Fecal Occult Bld POSITIVE (*)    All other components within normal limits  COMPREHENSIVE METABOLIC PANEL  TYPE AND SCREEN    EKG  EKG Interpretation None       Radiology No results found.  Procedures Procedures (including critical care time)  Medications Ordered in ED Medications  sodium chloride 0.9 % bolus 500 mL (500 mLs Intravenous Bolus from Bag 04/25/17 2111)     Initial Impression / Assessment and Plan / ED Course  I have reviewed the triage vital signs and the nursing notes.  Pertinent labs & imaging results that were available  during my care of the patient were reviewed by me and considered in my medical decision making (see chart for details).     Patient was discharged from the hospital yesterday after an admission for a lower GI bleed. Family reports 3 episodes of rectal bleeding since discharge. Rectal exam reveals grossly bloody/Maroon stool. Will discuss with hospitalist.  Final Clinical Impressions(s) / ED Diagnoses   Final diagnoses:  Lower GI bleed    New Prescriptions New Prescriptions   No medications on file  I personally performed the services described in this documentation, which was scribed in my presence. The recorded information has been reviewed and is accurate.     Donnetta Hutching, MD 04/25/17 2134

## 2017-04-25 NOTE — H&P (Signed)
History and Physical    [name]:[id number] DOB: [birth year] DOA: 04/25/2017  PCP: [facility]   Patient coming from: Home  Chief Complaint:   HPI: Lynn Nichols is a 83 y.o. female with medical history significant of hypertension and diverticulosis. Patient presented to the ED with complaints of 3 episodes of bleeding today, with bowel movements. No abdominal pain no vomiting no nausea. Weight has been stable, no family history of colon cancer. Patient's last colonoscopy- about 2 years ago. Patient denies NSAID use, not on anticoagulation. Patient admitted 5/24- 5/25, for same, but patient and family declined colonoscopy. Today they have not changed their minds about endoluminal evaluation.   ED Course: Vitals stable, Hgb- 10.8, platelets- 174. FOBT- positive. Cr- 1.0, baseline- 0.8-0.9. BUN- 17. ED provider notes gross red blood in the rectum. 500 mg normal saline bolus given in ED. Hospitalists asked to admit for lower GI bleed.  Review of Systems: As per HPI otherwise 10 point review of systems negative.   Past Medical History:  Diagnosis Date  . Arthritis   . Back pain   . Diverticulosis   . Headache   . Hypertension     Past Surgical History:  Procedure Laterality Date  . ABDOMINAL HYSTERECTOMY    . CHOLECYSTECTOMY    . COLONOSCOPY N/A 07/01/2014   Procedure: COLONOSCOPY;  Surgeon: Malissa Hippo, MD;  [facility];  Service: Endoscopy;  Laterality: N/A;  . COLONOSCOPY N/A 07/05/2014   Procedure: COLONOSCOPY;  Surgeon: Malissa Hippo, MD;  Location[facility];  Service: Endoscopy;  Laterality: N/A;     reports that she has never smoked. Her smokeless tobacco use includes Snuff. She reports that she does not drink alcohol or use drugs.  Allergies  Allergen Reactions  . Aspirin     Advised by MD not take due to bleed  . Penicillins Other (See Comments)    Has patient had a PCN reaction causing immediate rash,  facial/tongue/throat swelling, SOB or lightheadedness with hypotension: No Has patient had a PCN reaction causing severe rash involving mucus membranes or skin necrosis: No Has patient had a PCN reaction that required hospitalization YES Has patient had a PCN reaction occurring within the last 10 years: No If all of the above answers are "NO", then may proceed with Cephalosporin use. "PASSES OUT"    Family History  Problem Relation Age of Onset  . Hypertension Mother   . Hypertension Father   . Diabetes Other     Prior to Admission medications   Medication Sig Start Date End Date Taking? Authorizing Provider  albuterol (PROAIR HFA) 108 (90 BASE) MCG/ACT inhaler Inhale 2 puffs into the lungs every 6 (six) hours as needed. For shortness of breath    [provider]  Camphor-Eucalyptus-Menthol (VICKS VAPORUB EX) Apply 1 application topically daily as needed (pain).    [provider]  ferrous sulfate 325 (65 FE) MG EC tablet Take 1 tablet (325 mg total) by mouth 2 (two) times daily with a meal. 04/24/17   Sharday Michl, Courage, MD  latanoprost (XALATAN) 0.005 % ophthalmic solution Place 1 drop into both eyes at bedtime.  09/26/15   [provider]  lisinopril (PRINIVIL,ZESTRIL) 10 MG tablet Take 10 mg by mouth daily.     [provider]  Menthol-Methyl Salicylate (MUSCLE RUB) 10-15 % CREA Apply 1 application topically as needed for muscle pain.    [provider]  montelukast (SINGULAIR) 10 MG  tablet Take 1 tablet by mouth daily. 01/07/17   [provider]  neomycin-polymyxin-hydrocortisone (CORTISPORIN) 3.5-10000-1 otic suspension Place 4 drops into the left ear 3 (three) times daily. For 7 days Patient not taking: Reported on 03/11/2017 10/25/16   Pauline Aus, PA-C  NIFEdipine (PROCARDIA XL/ADALAT-CC) 90 MG 24 hr tablet Take 90 mg by mouth every morning.     [provider]  traMADol (ULTRAM) 50 MG tablet Take 50 mg by mouth 3  (three) times daily as needed for moderate pain.     [provider]   Physical Exam: Vitals:   04/25/17 1956 04/25/17 2114 04/25/17 2200  BP:  (!) 169/66 (!) 157/62  Pulse:  82 82  Resp:  17 17  SpO2:  96% 99%  Weight: 58.5 kg (129 lb)    Height: 5\' 4"  (1.626 m)      Constitutional: NAD, calm, comfortable Vitals:   04/25/17 1956 04/25/17 2114 04/25/17 2200  BP:  (!) 169/66 (!) 157/62  Pulse:  82 82  Resp:  17 17  SpO2:  96% 99%  Weight: 58.5 kg (129 lb)    Height: 5\' 4"  (1.626 m)     Eyes: PERRL, lids and conjunctivae normal ENMT: Mucous membranes are moist. Posterior pharynx clear of any exudate or lesions. Poor dentition overall  Neck: normal, supple, no masses, no thyromegaly Respiratory: clear to auscultation bilaterally, no wheezing, no crackles. Normal respiratory effort. No accessory muscle use.  Cardiovascular: Regular rate and rhythm, 2/6 intermittent murmurs aortic region. No extremity edema. 2+ pedal pulses. Abdomen: no tenderness, no masses palpated. No hepatosplenomegaly. Bowel sounds positive.  Musculoskeletal: no clubbing / cyanosis. No joint deformity upper and lower extremities. Good ROM, no contractures. Normal muscle tone.  Skin: no rashes, lesions, ulcers. No induration. Neurologic: CN 2-12 grossly intact. Sensation intact, DTR normal. Strength 5/5 in all 4.  Psychiatric: Normal judgment and insight. Alert and oriented x 3. Normal mood.   Labs on Admission: I have personally reviewed following labs and imaging studies  CBC:  Recent Labs Lab 04/22/17 1801 04/22/17 2321 04/23/17 0511 04/23/17 1403 04/24/17 0549 04/25/17 2040  WBC 5.3  --   --  5.3 3.9* 5.9  HGB 11.4* 11.7* 11.2* 10.2* 9.9* 10.8*  HCT 34.7* 35.7* 33.9* 30.9* 30.3* 33.1*  MCV 88.3  --   --  89.0 89.4 88.3  PLT 174  --   --  151 168 178   Basic Metabolic Panel:  Recent Labs Lab 04/22/17 1801 04/23/17 0511 04/24/17 0549 04/25/17 2040  NA 138 139 138 139  K 3.3* 3.3*  4.2 3.9  CL 105 107 108 106  CO2 25 26 25 26   GLUCOSE 142* 112* 90 124*  BUN 17 16 15 17   CREATININE 0.90 0.74 0.80 1.02*  CALCIUM 9.1 8.4* 8.5* 9.2  MG  --  1.9  --   --    Liver Function Tests:  Recent Labs Lab 04/22/17 1801 04/25/17 2040  AST 28 30  ALT 13* 18  ALKPHOS 45 47  BILITOT 0.5 0.3  PROT 7.6 7.4  ALBUMIN 3.8 3.7   Urine analysis:    Component Value Date/Time   COLORURINE YELLOW 10/25/2015 2125   APPEARANCEUR CLEAR 10/25/2015 2125   LABSPEC 1.010 10/25/2015 2125   PHURINE 6.0 10/25/2015 2125   GLUCOSEU NEGATIVE 10/25/2015 2125   HGBUR NEGATIVE 10/25/2015 2125   BILIRUBINUR NEGATIVE 10/25/2015 2125   KETONESUR NEGATIVE 10/25/2015 2125   PROTEINUR NEGATIVE 10/25/2015 2125   UROBILINOGEN 0.2 02/17/2015  1502   NITRITE NEGATIVE 10/25/2015 2125   LEUKOCYTESUR NEGATIVE 10/25/2015 2125   Radiological Exams on Admission: No results found.  EKG: Independently reviewed.   Assessment/Plan Principal Problem:   Acute lower gastrointestinal bleeding Active Problems:   Essential hypertension   Diverticulosis  Acute lower GI bleeding- Hgb- 10.8, improved compared to last hemoglobin 5/26- 9.9. History of diverticulosis. Doubt malignancy. Recent hospital admission for same. Patient initially refused admission, eventually agreed. Patient still refusing endoscopic evaluation. 500 mL normal saline bolus given in ED First and last colonoscopy, 2 years ago.  - Admit to telemetry, obs - H&H every 8 hourly 3 - Type and screen done in ED - Hold off on IV fluids for now, can start in a.m. if continued GI blood loss. - Conservative management for now, as family is refusing endoscopic evaluation  Hypertension- vitals stable, with elevated blood pressure. - Hold BP medications nifedipine 90 and lisinopril 10, in the setting of GI blood loss.  Diverticulosis  DVT prophylaxis: SCds Code Status: DNR- confirmed with patient at bedside , daughter Steward DroneBrenda is the HCPOA. Family  Communication:  2 daughters at beside. Disposition Plan: Home  Consults called: None  Admission status: tele Obs.  Onnie BoerEjiroghene E Armenia Silveria MD [facility] Pager [phone]  If 11pm-7AM, please contact night-coverage [email or web address] Password [email or web address]  04/25/2017, 10:36 PM

## 2017-04-26 ENCOUNTER — Encounter (HOSPITAL_COMMUNITY): Payer: Self-pay

## 2017-04-26 DIAGNOSIS — K5731 Diverticulosis of large intestine without perforation or abscess with bleeding: Secondary | ICD-10-CM

## 2017-04-26 DIAGNOSIS — D500 Iron deficiency anemia secondary to blood loss (chronic): Secondary | ICD-10-CM | POA: Diagnosis not present

## 2017-04-26 DIAGNOSIS — D62 Acute posthemorrhagic anemia: Secondary | ICD-10-CM | POA: Diagnosis not present

## 2017-04-26 DIAGNOSIS — K922 Gastrointestinal hemorrhage, unspecified: Principal | ICD-10-CM

## 2017-04-26 DIAGNOSIS — I10 Essential (primary) hypertension: Secondary | ICD-10-CM | POA: Diagnosis not present

## 2017-04-26 LAB — HEMOGLOBIN AND HEMATOCRIT, BLOOD
HEMATOCRIT: 29.7 % — AB (ref 36.0–46.0)
HEMATOCRIT: 30.8 % — AB (ref 36.0–46.0)
HEMOGLOBIN: 9.7 g/dL — AB (ref 12.0–15.0)
Hemoglobin: 10.1 g/dL — ABNORMAL LOW (ref 12.0–15.0)

## 2017-04-26 MED ORDER — NIFEDIPINE ER OSMOTIC RELEASE 30 MG PO TB24
90.0000 mg | ORAL_TABLET | Freq: Every day | ORAL | Status: DC
  Administered 2017-04-26 – 2017-04-27 (×2): 90 mg via ORAL
  Filled 2017-04-26 (×2): qty 3

## 2017-04-26 MED ORDER — LISINOPRIL 10 MG PO TABS
10.0000 mg | ORAL_TABLET | Freq: Every day | ORAL | Status: DC
  Administered 2017-04-26 – 2017-04-27 (×2): 10 mg via ORAL
  Filled 2017-04-26 (×2): qty 1

## 2017-04-26 MED ORDER — FAMOTIDINE 20 MG PO TABS
20.0000 mg | ORAL_TABLET | Freq: Every day | ORAL | Status: DC
  Administered 2017-04-26 – 2017-04-27 (×2): 20 mg via ORAL
  Filled 2017-04-26 (×2): qty 1

## 2017-04-26 MED ORDER — TRAMADOL HCL 50 MG PO TABS
50.0000 mg | ORAL_TABLET | Freq: Three times a day (TID) | ORAL | Status: DC | PRN
  Administered 2017-04-26: 50 mg via ORAL
  Filled 2017-04-26: qty 1

## 2017-04-26 NOTE — Progress Notes (Signed)
PROGRESS NOTE    Lynn Nichols  WUJ:[id number] DOB: Mar 09, 1914 DOA: 04/25/2017 PCP: [facility]    Brief Narrative:  83 yo female presented with the chief complain of bloody bowel movements. Patient known to have HTN and diverticulosis. Had 3 episodes of bleeding, with no abdominal pain, nausea or vomiting. Had colonoscopy 2 years ago, recent hospitalization 5/24 to 5/25 with GI bleeding and declined colonoscopy. On the initial physical examination, her blood pressure was 169/66, HR 82, RR17, oxygen saturation 96%. Her mucous membranes were moist, lungs clear to auscultation, heart rhythmic S1 and S2, 2/6 systolic murmur at the base of the heart, abdomen soft and non tender, no lower extremity edema. Hb at 10.8 Admitted for conservative management.    Assessment & Plan:   Principal Problem:   Acute lower gastrointestinal bleeding Active Problems:   Essential hypertension   Diverticulosis  1. Lower GI bleed. Patient with persistent bleeding, drop in hb and hct to 9,7 from 10.3, complains of weakness and chest pain. Will continue close monitoring of cell count. Add antiacid therapy. Patient refusing endoscopic procedure. Old records personally reviewed, colonoscopy in 2015:  Colonoscopy 07/05/2014  8/6/201Impression:  Examination performed to cecum.  Pancolonic diverticulosis.  Single 25 x 10 mm ulcerated rectosigmoid junction without stigmata of bleed. Biopsy taken.  Comment;  She possibly bled from this ulcer. Endoscopically this ulcer appears benign.   Continue to advance diet as tolerated.   2. Diverticulosis. No signs of diverticulitis, no indication for antibiotic therapy  3. Acute blood loss anemia. Drop in hb down to 9.7, will continue close monitoring of H&H, hold blood transfusion for now unless profuse bleeding or hb less than 7 range. Patient with high risk for worsening anemia.   4. Iron deficiency anemia. Will continue po iron supplements,  follow iron panel in am.   5. HTN. Will resume lisinopril and nifedipine for blood pressure control.    DVT prophylaxis: scd Code Status: Full  Family Communication:  Disposition Plan:    Consultants:     Procedures:   Antimicrobials:    Subjective: Patient continue bleeding per rectum, associated with weakness and fatigue, no orthostatic symptoms or dyspnea. Reports episode of self limited chest pain. No nausea or vomiting. No fever or chills.   Objective: Vitals:   04/25/17 2114 04/25/17 2200 04/25/17 2345 04/26/17 0628  BP: (!) 169/66 (!) 157/62 (!) 179/79 139/65  Pulse: 82 82 79 66  Resp: 17 17  18   Temp:   97.6 F (36.4 C) 98.1 F (36.7 C)  TempSrc:   Oral Oral  SpO2: 96% 99% 99% 100%  Weight:   59.1 kg (130 lb 4.7 oz)   Height:   5\' 3"  (1.6 m)     Intake/Output Summary (Last 24 hours) at 04/26/17 0848 Last data filed at 04/26/17 0500  Gross per 24 hour  Intake              500 ml  Output              800 ml  Net             -300 ml   Filed Weights   04/25/17 1145 04/25/17 1956 04/25/17 2345  Weight: 59.1 kg (130 lb 4.7 oz) 58.5 kg (129 lb) 59.1 kg (130 lb 4.7 oz)    Examination:  General exam: deconditioned E ENT; Mild pallor, no icterus, oral mucosa moist. Respiratory system: Clear to auscultation. Respiratory effort normal. No wheezing,  rales or rhonchi.  Cardiovascular system: S1 & S2 heard, RRR. No JVD, murmurs, rubs, gallops or clicks. No pedal edema. Gastrointestinal system: Abdomen is mild distended, soft and nontender. No organomegaly or masses felt. Decreased  bowel sounds. Central nervous system: Alert and oriented. No focal neurological deficits. Extremities: Symmetric 5 x 5 power. Skin: No rashes, lesions or ulcers     Data Reviewed: I have personally reviewed following labs and imaging studies  CBC:  Recent Labs Lab 04/22/17 1801  04/23/17 0511 04/23/17 1403 04/24/17 0549 04/25/17 2040 04/26/17 0245  WBC 5.3  --   --  5.3  3.9* 5.9  --   HGB 11.4*  < > 11.2* 10.2* 9.9* 10.8* 9.7*  HCT 34.7*  < > 33.9* 30.9* 30.3* 33.1* 29.7*  MCV 88.3  --   --  89.0 89.4 88.3  --   PLT 174  --   --  151 168 178  --   < > = values in this interval not displayed. Basic Metabolic Panel:  Recent Labs Lab 04/22/17 1801 04/23/17 0511 04/24/17 0549 04/25/17 2040  NA 138 139 138 139  K 3.3* 3.3* 4.2 3.9  CL 105 107 108 106  CO2 25 26 25 26   GLUCOSE 142* 112* 90 124*  BUN 17 16 15 17   CREATININE 0.90 0.74 0.80 1.02*  CALCIUM 9.1 8.4* 8.5* 9.2  MG  --  1.9  --   --    GFR: Estimated Creatinine Clearance: 24.3 mL/min (A) (by C-G formula based on SCr of 1.02 mg/dL (H)). Liver Function Tests:  Recent Labs Lab 04/22/17 1801 04/25/17 2040  AST 28 30  ALT 13* 18  ALKPHOS 45 47  BILITOT 0.5 0.3  PROT 7.6 7.4  ALBUMIN 3.8 3.7   No results for input(s): LIPASE, AMYLASE in the last 168 hours. No results for input(s): AMMONIA in the last 168 hours. Coagulation Profile: No results for input(s): INR, PROTIME in the last 168 hours. Cardiac Enzymes: No results for input(s): CKTOTAL, CKMB, CKMBINDEX, TROPONINI in the last 168 hours. BNP (last 3 results) No results for input(s): PROBNP in the last 8760 hours. HbA1C: No results for input(s): HGBA1C in the last 72 hours. CBG: No results for input(s): GLUCAP in the last 168 hours. Lipid Profile: No results for input(s): CHOL, HDL, LDLCALC, TRIG, CHOLHDL, LDLDIRECT in the last 72 hours. Thyroid Function Tests: No results for input(s): TSH, T4TOTAL, FREET4, T3FREE, THYROIDAB in the last 72 hours. Anemia Panel: No results for input(s): VITAMINB12, FOLATE, FERRITIN, TIBC, IRON, RETICCTPCT in the last 72 hours. Sepsis Labs: No results for input(s): PROCALCITON, LATICACIDVEN in the last 168 hours.  No results found for this or any previous visit (from the past 240 hour(s)).       Radiology Studies: No results found.      Scheduled Meds: . ferrous sulfate  325 mg  Oral BID WC  . latanoprost  1 drop Both Eyes QHS  . montelukast  10 mg Oral Daily   Continuous Infusions:   LOS: 0 days     Irva Loser Annett Gulaaniel Claus Silvestro, MD [facility] Pager [phone]  If 7PM-7AM, please contact night-coverage [email or web address] Password [facility] 04/26/2017, 8:48 AM

## 2017-04-26 NOTE — Care Management Obs Status (Signed)
MEDICARE OBSERVATION STATUS NOTIFICATION   Patient Details  Name: Lynn Nichols MRN: [id number] Date of Birth: [birth year]   Medicare Observation Status Notification Given:  Yes    Phoebe Marter, Chrystine OilerSharley Diane, RN 04/26/2017, 11:41 AM

## 2017-04-26 NOTE — Care Management Note (Signed)
Case Management Note  Patient Details  Name: Lynn Nichols MRN: [id number] Date of Birth: 1914-10-22    Expected Discharge Date:  04/28/17               Expected Discharge Plan:  Home/Self Care  In-House Referral:     Discharge planning Services  CM Consult  Post Acute Care Choice:  Durable Medical Equipment Choice offered to:  Patient, Adult Children  DME Arranged:  Wheelchair manual DME Agency:  [facility]  [facility]    [facility] Agency:     Status of Service:  Completed, signed off  If discussed at [employer] Length of Stay Meetings, dates discussed:    Additional Comments:Patient seen recently by this CM (see previous note). Patient will need WC. Offered choice of DME agencies. [facility]   Lynn Nichols, Lynn OilerSharley Diane, RN 04/26/2017, 11:59 AM

## 2017-04-26 NOTE — Care Management (Signed)
    Durable Medical Equipment        Start     Ordered   04/26/17 1154  For home use only DME standard manual wheelchair with seat cushion  Once    Comments:  Patient suffers from weaknes which impairs their ability to perform daily activities like walking in the home.  A walker will not resolve  issue with performing activities of daily living. A wheelchair will allow patient to safely perform daily activities. Patient can safely propel the wheelchair in the home or has a caregiver who can provide assistance.  Accessories: elevating leg rests (ELRs), wheel locks, extensions and anti-tippers.   04/26/17 1154

## 2017-04-26 NOTE — Evaluation (Signed)
Physical Therapy Evaluation Patient Details Name: SIDONIA NUTTER MRN: [id number] DOB: Sep 24, 1914 Today's Date: 04/26/2017   History of Present Illness  83 yo female presented with the chief complain of bloody bowel movements. Patient known to have HTN and diverticulosis. Had 3 episodes of bleeding, with no abdominal pain, nausea or vomiting. Had colonoscopy 2 years ago, recent hospitalization 5/24 to 5/25 with GI bleeding and declined colonoscopy. On the initial physical examination, her blood pressure was 169/66, HR 82, RR17, oxygen saturation 96%. Her mucous membranes were moist, lungs clear to auscultation, heart rhythmic S1 and S2, 2/6 systolic murmur at the base of the heart, abdomen soft and non tender, no lower extremity edema. Hb at 10.8 Admitted for conservative management.   Clinical Impression  Pt is a pleasant 83 y.o. F, alert and oriented to person place and time and agreeable to PT evaluation. She demonstrates 5/5 BLE strength, and independence with bed mobility and transfers. She was able to ambulate ~156ft with RW and for the last 25 ft without an AD. She showed no signs of unsteadiness or LOB and per her daughters, she is performing at her baseline for all tasks. She does not require skilled PT at this time and will be safe to d/c home with her daughters. Per her daughter, pt is unable to ambulate [place] distances at the grocery store and would benefit from a manual wheelchair to allow her to maintain some level of independence with these activities. PT will sign off.   Follow Up Recommendations No PT follow up    Equipment Recommendations  Other (comment) (Pt often becomes fatigued when ambulating community distances and could benefit from manual wheelchair to allow her to maintain her quality of life and mobility for long distances in the community)    Recommendations for Other Services       Precautions / Restrictions Restrictions Weight Bearing Restrictions: No       Mobility  Bed Mobility Overal bed mobility: Independent                Transfers Overall transfer level: Independent Equipment used: None                Ambulation/Gait Ambulation/Gait assistance: Modified independent (Device/Increase time) Ambulation Distance (Feet): 150 Feet Assistive device: Rolling walker (2 wheeled);None Gait Pattern/deviations: WFL(Within Functional Limits)     General Gait Details: Pt ambualted ~125 ft with RW and min guard, and was able to ambulate 25 ft back to the room with min guard and no AD.   Stairs            Wheelchair Mobility    Modified Rankin (Stroke Patients Only)       Balance Overall balance assessment: No apparent balance deficits (not formally assessed)                                           Pertinent Vitals/Pain      Home Living Family/patient expects to be discharged to:: Private residence Living Arrangements: Children Available Help at Discharge: Family;Available 24 hours/day Type of Home: House Home Access: Stairs to enter Entrance Stairs-Rails: Can reach both Entrance Stairs-Number of Steps: 3 Home Layout: Able to live on main level with bedroom/bathroom Home Equipment: Walker - 2 wheels      Prior Function Level of Independence: Independent with assistive device(s)  Comments: Pt's daughters assist with making her meals and pt does not drive, but will often go out into the community with her daughters      Hand Dominance        Extremity/Trunk Assessment   Upper Extremity Assessment Upper Extremity Assessment: Overall WFL for tasks assessed    Lower Extremity Assessment Lower Extremity Assessment: Overall WFL for tasks assessed       Communication   Communication: No difficulties  Cognition Arousal/Alertness: Awake/alert Behavior During Therapy: WFL for tasks assessed/performed Overall Cognitive Status: Within Functional Limits for tasks  assessed                                        General Comments      Exercises     Assessment/Plan    PT Assessment Patent does not need any further PT services  PT Problem List         PT Treatment Interventions      PT Goals (Current goals can be found in the Care Plan section)  Acute Rehab PT Goals Patient Stated Goal: return home  PT Goal Formulation: With patient Time For Goal Achievement: 05/03/17 Potential to Achieve Goals: Good    Frequency     Barriers to discharge        Co-evaluation               AM-PAC PT "6 Clicks" Daily Activity  Outcome Measure Difficulty turning over in bed (including adjusting bedclothes, sheets and blankets)?: None Difficulty moving from lying on back to sitting on the side of the bed? : None Difficulty sitting down on and standing up from a chair with arms (e.g., wheelchair, bedside commode, etc,.)?: None Help needed moving to and from a bed to chair (including a wheelchair)?: None Help needed walking in hospital room?: None Help needed climbing 3-5 steps with a railing? : A Little 6 Click Score: 23    End of Session Equipment Utilized During Treatment: Gait belt Activity Tolerance: Patient tolerated treatment well;No increased pain Patient left: in bed;with call bell/phone within reach;with nursing/sitter in room;with family/visitor present   PT Visit Diagnosis: Muscle weakness (generalized) (M62.81)    Time: 1036-1050 PT Time Calculation (min) (ACUTE ONLY): 14 min   Charges:   PT Evaluation $PT Eval Low Complexity: 1 Procedure PT Treatments $Therapeutic Activity: 8-22 mins   PT G Codes:   PT G-Codes **NOT FOR INPATIENT CLASS** Functional Assessment Tool Used: Clinical judgement Functional Limitation: Mobility: Walking and moving around Mobility: Walking and Moving Around Current Status (N0272(G8978): At least 1 percent but less than 20 percent impaired, limited or restricted Mobility: Walking and  Moving Around Goal Status [id number](G8979): At least 1 percent but less than 20 percent impaired, limited or restricted Mobility: Walking and Moving Around Discharge Status [id number](G8980): At least 1 percent but less than 20 percent impaired, limited or restricted   11:08 AM,04/26/17 Marylyn IshiharaSara Kiser PT, DPT [facility] [phone]

## 2017-04-26 NOTE — Progress Notes (Signed)
MD gave order to leave IV out for tomorrow in anticipation of discharge tomorrow.

## 2017-04-27 ENCOUNTER — Encounter (HOSPITAL_COMMUNITY): Payer: Self-pay | Admitting: Primary Care

## 2017-04-27 DIAGNOSIS — K5731 Diverticulosis of large intestine without perforation or abscess with bleeding: Secondary | ICD-10-CM | POA: Diagnosis not present

## 2017-04-27 DIAGNOSIS — Z515 Encounter for palliative care: Secondary | ICD-10-CM | POA: Diagnosis not present

## 2017-04-27 DIAGNOSIS — I10 Essential (primary) hypertension: Secondary | ICD-10-CM | POA: Diagnosis not present

## 2017-04-27 DIAGNOSIS — K922 Gastrointestinal hemorrhage, unspecified: Secondary | ICD-10-CM | POA: Diagnosis not present

## 2017-04-27 DIAGNOSIS — D500 Iron deficiency anemia secondary to blood loss (chronic): Secondary | ICD-10-CM | POA: Diagnosis not present

## 2017-04-27 DIAGNOSIS — Z7189 Other specified counseling: Secondary | ICD-10-CM

## 2017-04-27 LAB — CBC WITH DIFFERENTIAL/PLATELET
Basophils Absolute: 0 10*3/uL (ref 0.0–0.1)
Basophils Relative: 0 %
EOS ABS: 0.2 10*3/uL (ref 0.0–0.7)
EOS PCT: 3 %
HCT: 35.8 % — ABNORMAL LOW (ref 36.0–46.0)
HEMOGLOBIN: 11.5 g/dL — AB (ref 12.0–15.0)
LYMPHS ABS: 2.1 10*3/uL (ref 0.7–4.0)
Lymphocytes Relative: 30 %
MCH: 28.7 pg (ref 26.0–34.0)
MCHC: 32.1 g/dL (ref 30.0–36.0)
MCV: 89.3 fL (ref 78.0–100.0)
MONO ABS: 0.5 10*3/uL (ref 0.1–1.0)
MONOS PCT: 8 %
NEUTROS PCT: 59 %
Neutro Abs: 4.3 10*3/uL (ref 1.7–7.7)
Platelets: 214 10*3/uL (ref 150–400)
RBC: 4.01 MIL/uL (ref 3.87–5.11)
RDW: 14.2 % (ref 11.5–15.5)
WBC: 7.2 10*3/uL (ref 4.0–10.5)

## 2017-04-27 LAB — IRON AND TIBC
IRON: 55 ug/dL (ref 28–170)
SATURATION RATIOS: 16 % (ref 10.4–31.8)
TIBC: 342 ug/dL (ref 250–450)
UIBC: 287 ug/dL

## 2017-04-27 LAB — TRANSFERRIN: Transferrin: 244 mg/dL (ref 192–382)

## 2017-04-27 LAB — FERRITIN: Ferritin: 69 ng/mL (ref 11–307)

## 2017-04-27 MED ORDER — FAMOTIDINE 20 MG PO TABS: 20.0000 mg | ORAL_TABLET | Freq: Every day | ORAL | 0 refills | Status: DC

## 2017-04-27 NOTE — Care Management (Addendum)
CM verified with patient and family that they [facility] and [facility].  [facility]. Patient discharged today and hospice can follow up with patient at home. [facility] No  Further CM needs.  Revere,Brenda Daughter [phone]    [facility],Kay Daughter [phone]

## 2017-04-27 NOTE — Care Management (Signed)
CM discussed patient with hospitalist, ? Palliative consult would be appropriate for patient to discuss GOC. Consult placed. Palliative NP notified.

## 2017-04-27 NOTE — Progress Notes (Signed)
Pt discharged home today per Dr. Ella JubileeArrien. Pt's VSS.  Pt provided with home medication list, discharge instructions and prescriptions.  Verbalized understanding.  Pt left floor via WC in stable condition accompanied by NT.

## 2017-04-27 NOTE — Consult Note (Signed)
Consultation Note Date: 04/27/2017   Patient Name: Lynn Nichols  DOB: 04-28-1914  MRN: [id number]  Age / Sex: 83 y.o., female  PCP: [facility] Referring Physician: Ella Jubilee, York Ram  Reason for Consultation: Establishing goals of care and Psychosocial/spiritual support  HPI/Patient Profile: 83 y.o. female  with past medical history of arthritis, back pain, diverticulosis, hypertension admitted on 04/25/2017 with acute lower G.I. bleed.   Clinical Assessment and Goals of Care: Mrs. Fogleman greets me making and keeping eye contact as I enter. She is calm, pleasant and cooperative. Present at bedside today is daughter Yliana Gravois and friend Almira Coaster. Mrs. Sobczak and I talk about her chronic and current health problems. We talk about her G.I. bleed.  She states that she would decline colonoscopy, we talk about the risks associated with colonoscopy. I share the concern is that without looking, we will not be held to stop the bleeding, and Mrs. Wisor will eventually lose too much blood.  I share with Mrs. Cunningham that we support her right to decline colonoscopy.  We talk about symptom management. Mild pain mostly related to arthritis, we discuss use of Tylenol not NSAIDs. We discuss indigestion medications. Mrs. Outten states she will suck on a piece of candy which is effective for her, we also talk about OCT Tums. We talk about bowel regimen in relation to use of iron tablets.  We talk about returning to the hospital. Mrs. Giebel states she will return if she is symptomatic, or has a large amount of bleeding. We talk about signs and symptoms of blood loss including increased heart rate, dizziness, but that fatigue may be normal for her at this time. Daughter Steward Drone who lives in the home (along with daughter Meriam Sprague), states that the goal is to NOT bring Mrs.  Depolo back to the hospital if she is not symptomatic, or it is a small amount of blood. I asked Mrs. Wickham of where she would like to be when she dies. She states that her preference is to die at home, and states that many of her children were able to die at home. She goes further to say that all of her 13 children were born at home and she nursed them.  We talk about hemoglobin levels, what is normal for Mrs. Schloss's age. I share that she is at 10.8, most people receive blood when they are around 7. Mrs. Tift states she will take blood if needed. We talk about the use of iron tablets to help build blood, and the side effect of constipation and dark stool. We talk about bowel regimen including warm prune juice and over-the-counter tablets as needed (preference being warm prune juice).  We talk about getting help at home, an RN coming to visit and being available by phone. An aide 3 days a week to help with bathing. Patient and family feel like that this would benefit them. I share that this service is called hospice. Family states that Mrs. Alcorta's granddaughter works for hospice  of [place]. They agree to have information sent to hospice for evaluation.  Nichols from [place], Charity fundraiser at hospice of [facility]. We discussed Mrs. Poulsen's case, referral made.  Healthcare power of attorney HCPOA - daughter Sherriann Szuch has legal healthcare power of attorney. Form not presented today. Mrs. Forrer bore 13 children, 6 are still living.   SUMMARY OF RECOMMENDATIONS   continue to treat the treatable but no extraordinary measures such as CPR or intubation.  Code Status/Advance Care Planning:  DNR - we discussed the concepts of allow a natural death.  Symptom Management:   per hospitalist, no additional needs at this time.  Palliative Prophylaxis:   Bowel Regimen  Additional Recommendations (Limitations, Scope, Preferences):  Treat the treatable but now CPR or  intubation. Will accept blood products.  Psycho-social/Spiritual:   Desire for further Chaplaincy support:no  Additional Recommendations: Caregiving  Support/Resources  Prognosis:   < 6 months or less would not be surprising based on advanced age, current G.I. bleed and declining to take a colonoscopy.  Discharge Planning: Patient and family are accepting of the benefits of hospice of [facility]. Referral made.      Primary Diagnoses: Present on Admission: . Essential hypertension . Acute lower gastrointestinal bleeding . Diverticulosis   I have reviewed the medical record, interviewed the patient and family, and examined the patient. The following aspects are pertinent.  Past Medical History:  Diagnosis Date  . Arthritis   . Back pain   . Diverticulosis   . Headache   . Hypertension    Social History   Social History  . Marital status: Widowed    Spouse name: N/A  . Number of children: N/A  . Years of education: N/A   Social History Main Topics  . Smoking status: Never Smoker  . Smokeless tobacco: Current User    Types: Snuff  . Alcohol use No  . Drug use: No  . Sexual activity: No   Other Topics Concern  . None   Social History Narrative  . None   Family History  Problem Relation Age of Onset  . Hypertension Mother   . Hypertension Father   . Diabetes Other    Scheduled Meds: . famotidine  20 mg Oral Daily  . ferrous sulfate  325 mg Oral BID WC  . latanoprost  1 drop Both Eyes QHS  . lisinopril  10 mg Oral Daily  . montelukast  10 mg Oral Daily  . NIFEdipine  90 mg Oral Daily   Continuous Infusions: PRN Meds:.albuterol, traMADol Medications Prior to Admission:  Prior to Admission medications   Medication Sig Start Date End Date Taking? Authorizing Provider  albuterol (PROAIR HFA) 108 (90 BASE) MCG/ACT inhaler Inhale 2 puffs into the lungs every 6 (six) hours as needed. For shortness of breath   Yes [provider]    Camphor-Eucalyptus-Menthol (VICKS VAPORUB EX) Apply 1 application topically daily as needed (pain).   Yes [provider]  ferrous sulfate 325 (65 FE) MG EC tablet Take 1 tablet (325 mg total) by mouth 2 (two) times daily with a meal. 04/24/17  Yes Emokpae, Courage, MD  latanoprost (XALATAN) 0.005 % ophthalmic solution Place 1 drop into both eyes at bedtime.  09/26/15  Yes [provider]  lisinopril (PRINIVIL,ZESTRIL) 10 MG tablet Take 10 mg by mouth daily.    Yes [provider]  Menthol-Methyl Salicylate (MUSCLE RUB) 10-15 % CREA Apply 1 application topically as needed for muscle pain.   Yes  [provider]  montelukast (SINGULAIR) 10 MG tablet Take 1 tablet by mouth daily. 01/07/17  Yes [provider]  NIFEdipine (PROCARDIA XL/ADALAT-CC) 90 MG 24 hr tablet Take 90 mg by mouth every morning.    Yes [provider]  traMADol (ULTRAM) 50 MG tablet Take 50 mg by mouth 3 (three) times daily as needed for moderate pain.    Yes [provider]  famotidine (PEPCID) 20 MG tablet Take 1 tablet (20 mg total) by mouth daily. 04/27/17   Arrien, York RamMauricio Daniel, MD   Allergies  Allergen Reactions  . Aspirin     Advised by MD not take due to bleed  . Penicillins Other (See Comments)    Has patient had a PCN reaction causing immediate rash, facial/tongue/throat swelling, SOB or lightheadedness with hypotension: No Has patient had a PCN reaction causing severe rash involving mucus membranes or skin necrosis: No Has patient had a PCN reaction that required hospitalization YES Has patient had a PCN reaction occurring within the last 10 years: No If all of the above answers are "NO", then may proceed with Cephalosporin use. "PASSES OUT"   Review of Systems  Unable to perform ROS: Age    Physical Exam  Constitutional: She is oriented to person, place, and time. She appears well-nourished. No distress.  HENT:  Head: Normocephalic and  atraumatic.  Cardiovascular: Normal rate and regular rhythm.   Pulmonary/Chest: Effort normal. No respiratory distress.  Abdominal: Soft. She exhibits no distension.  Musculoskeletal: She exhibits no edema.  Neurological: She is alert and oriented to person, place, and time.  Skin: Skin is warm and dry.  Dark skin on lower extremities  Psychiatric: She has a normal mood and affect. Her behavior is normal. Judgment and thought content normal.  Nursing note and vitals reviewed.   Vital Signs: BP (!) 158/65 (BP Location: Left Arm)   Pulse 64   Temp 98.1 F (36.7 C) (Oral)   Resp 20   Ht 5\' 3"  (1.6 m)   Wt 59.1 kg (130 lb 4.7 oz)   SpO2 100%   BMI 23.08 kg/m  Pain Assessment: No/denies pain   Pain Score: 0-No pain   SpO2: SpO2: 100 % O2 Device:SpO2: 100 % O2 Flow Rate: .   IO: Intake/output summary:  Intake/Output Summary (Last 24 hours) at 04/27/17 1105 Last data filed at 04/26/17 1700  Gross per 24 hour  Intake                0 ml  Output                0 ml  Net                0 ml    LBM: Last BM Date: 04/27/17 Baseline Weight: Weight: 59.1 kg (130 lb 4.7 oz) Most recent weight: Weight: 59.1 kg (130 lb 4.7 oz)     Palliative Assessment/Data:   Flowsheet Rows     Most Recent Value  Intake Tab  Referral Department  Hospitalist  Unit at Time of Referral  Med/Surg Unit  Palliative Care Primary Diagnosis  Other (Comment) [GI Bleed]  Date Notified  04/27/17  Palliative Care Type  New Palliative care  Reason for referral  Clarify Goals of Care, Advance Care Planning, Psychosocial or Spiritual support  Date of Admission  04/25/17  Date first seen by Palliative Care  04/27/17  # of days Palliative referral response time  0 Day(s)  # of days  IP prior to Palliative referral  2  Clinical Assessment  Palliative Performance Scale Score  70%  Pain Max last 24 hours  Not able to report  Pain Min Last 24 hours  Not able to report  Dyspnea Max Last 24 Hours  Not able to  report  Dyspnea Min Last 24 hours  Not able to report  Psychosocial & Spiritual Assessment  Palliative Care Outcomes  Patient/Family meeting held?  Yes  Who was at the meeting?  Patient and daughter Steward Drone, friend Almira Coaster  Palliative Care Outcomes  Counseled regarding hospice, Provided psychosocial or spiritual support, Clarified goals of care  Patient/Family wishes: Interventions discontinued/not started   Mechanical Ventilation      Time In: 0950   Time Out: 1100 Time Total: 70 minutes Greater than 50%  of this time was spent counseling and coordinating care related to the above assessment and plan.  Signed by: Katheran Awe, NP   Please contact Palliative Medicine Team phone at [phone] for questions and concerns.  For individual provider: See Loretha Stapler

## 2017-04-27 NOTE — Discharge Summary (Signed)
Physician Discharge Summary  Lynn Nichols WUJ:[id number] DOB: 11-21-1914 DOA: 04/25/2017  PCP: [facility]  Admit date: 04/25/2017 Discharge date: 04/27/2017  Admitted From: Home  Disposition:  Home   Recommendations for Outpatient Follow-up:  1. Follow up with PCP in 1- week 2. Added famotidine 3. Monitor bleeding 4. Patient and her family have refused endoscopic procedures  Home Health: Yes  Equipment/Devices: wheelchair  Discharge Condition: Stable  CODE STATUS: DNR Diet recommendation: Heart Healthy   Brief/Interim Summary: 83 yo female presented with the chief complain of bloody bowel movements. Patient known to have HTN and diverticulosis. Had 3 episodes of bleeding, with no abdominal pain, nausea or vomiting. Had colonoscopy 2 years ago, recent hospitalization 5/24 to 5/25 with GI bleeding and declined colonoscopy. On the initial physical examination, her blood pressure was 169/66, HR 82, RR17, oxygen saturation 96%. Her mucous membranes were moist, lungs clear to auscultation, heart rhythmic S1 and S2, 2/6 systolic murmur at the base of the heart, abdomen soft and non tender, no lower extremity edema. Sodium 139, potassium 3.9, chloride 106, bicarbonate 26, glucose 134, BUN 17, creatinine 1.02, white count 5.9, Hb 10.8, hematocrit 33.1, platelets 178. Hemoccult was positive. EKG was sinus arrhythmia.  The patient was admitted to the hospital working diagnosis of acute lower GI bleed.  1. Acute lower GI bleed. Patient was admitted to hospital for observation, patient and her family had refused any endoscopic procedure, last colonoscopy 2015 with pan diverticulosis and a single 2510 mm ulcerated rectosigmoid junction lesion, which was negative for malignancy. Patient continued to have lower GI bleed, but no decrease in intensity follow hemoglobin dropped to  9.7, reaching a plateau of 10-11. Patient will be discharged home with close follow-up, home  health has been arranged.  2. Acute blood loss anemia. Hemoglobin and hematocrit were closely monitored, no blood transfusion was given.  3. Iron deficiency anemia. Patient will continue iron supplements.  4. Hypertension. Patient was continued on lisinopril and nifedipine with no major complications. Discharge blood pressure 158/65.     Discharge Diagnoses:  Principal Problem:   Acute lower gastrointestinal bleeding Active Problems:   Essential hypertension   Diverticulosis    Discharge Instructions   Allergies as of 04/27/2017      Reactions   Aspirin    Advised by MD not take due to bleed   Penicillins Other (See Comments)   Has patient had a PCN reaction causing immediate rash, facial/tongue/throat swelling, SOB or lightheadedness with hypotension: No Has patient had a PCN reaction causing severe rash involving mucus membranes or skin necrosis: No Has patient had a PCN reaction that required hospitalization YES Has patient had a PCN reaction occurring within the last 10 years: No If all of the above answers are "NO", then may proceed with Cephalosporin use. "PASSES OUT"      Medication List    TAKE these medications   famotidine 20 MG tablet Commonly known as:  PEPCID Take 1 tablet (20 mg total) by mouth daily.   ferrous sulfate 325 (65 FE) MG EC tablet Take 1 tablet (325 mg total) by mouth 2 (two) times daily with a meal.   latanoprost 0.005 % ophthalmic solution Commonly known as:  XALATAN Place 1 drop into both eyes at bedtime.   lisinopril 10 MG tablet Commonly known as:  PRINIVIL,ZESTRIL Take 10 mg by mouth daily.   montelukast 10 MG tablet Commonly known as:  SINGULAIR Take 1 tablet by mouth daily.  MUSCLE RUB 10-15 % Crea Apply 1 application topically as needed for muscle pain.   NIFEdipine 90 MG 24 hr tablet Commonly known as:  PROCARDIA XL/ADALAT-CC Take 90 mg by mouth every morning.   PROAIR HFA 108 (90 Base) MCG/ACT inhaler Generic drug:   albuterol Inhale 2 puffs into the lungs every 6 (six) hours as needed. For shortness of breath   traMADol 50 MG tablet Commonly known as:  ULTRAM Take 50 mg by mouth 3 (three) times daily as needed for moderate pain.   VICKS VAPORUB EX Apply 1 application topically daily as needed (pain).            Durable Medical Equipment        Start     Ordered   04/26/17 1154  For home use only DME standard manual wheelchair with seat cushion  Once    Comments:  Patient suffers from weaknes which impairs their ability to perform daily activities like walking in the home.  A walker will not resolve  issue with performing activities of daily living. A wheelchair will allow patient to safely perform daily activities. Patient can safely propel the wheelchair in the home or has a caregiver who can provide assistance.  Accessories: elevating leg rests (ELRs), wheel locks, extensions and anti-tippers.   04/26/17 1154      Allergies  Allergen Reactions  . Aspirin     Advised by MD not take due to bleed  . Penicillins Other (See Comments)    Has patient had a PCN reaction causing immediate rash, facial/tongue/throat swelling, SOB or lightheadedness with hypotension: No Has patient had a PCN reaction causing severe rash involving mucus membranes or skin necrosis: No Has patient had a PCN reaction that required hospitalization YES Has patient had a PCN reaction occurring within the last 10 years: No If all of the above answers are "NO", then may proceed with Cephalosporin use. "PASSES OUT"    Consultations:     Procedures/Studies:  No results found.    Subjective: Patient feeling better, no nausea or vomiting, no orthostatic symptoms, lower GI bleeding is decreasing in intensity. No abdominal pain.   Discharge Exam: Vitals:   04/26/17 1550 04/27/17 0510  BP: (!) 137/57 (!) 158/65  Pulse: 61 64  Resp: 20 20  Temp: 98.1 F (36.7 C) 98.1 F (36.7 C)   Vitals:   04/25/17 2345  04/26/17 0628 04/26/17 1550 04/27/17 0510  BP: (!) 179/79 139/65 (!) 137/57 (!) 158/65  Pulse: 79 66 61 64  Resp:  18 20 20   Temp: 97.6 F (36.4 C) 98.1 F (36.7 C) 98.1 F (36.7 C) 98.1 F (36.7 C)  TempSrc: Oral Oral Oral Oral  SpO2: 99% 100% 100% 100%  Weight: 59.1 kg (130 lb 4.7 oz)     Height: 5\' 3"  (1.6 m)       General: Pt is alert, awake, not in acute distress E ENT; mild pallor, no icterus, oral mucosa moist.  Cardiovascular: RRR, S1/S2 +, no rubs, no gallops Respiratory: CTA bilaterally, no wheezing, no rhonchi Abdominal: Soft, NT, ND, bowel sounds + Extremities: no edema, no cyanosis    The results of significant diagnostics from this hospitalization (including imaging, microbiology, ancillary and laboratory) are listed below for reference.     Microbiology: No results found for this or any previous visit (from the past 240 hour(s)).   Labs: BNP (last 3 results) No results for input(s): BNP in the last 8760 hours. Basic Metabolic Panel:  Recent  Labs Lab 04/22/17 1801 04/23/17 0511 04/24/17 0549 04/25/17 2040  NA 138 139 138 139  K 3.3* 3.3* 4.2 3.9  CL 105 107 108 106  CO2 25 26 25 26   GLUCOSE 142* 112* 90 124*  BUN 17 16 15 17   CREATININE 0.90 0.74 0.80 1.02*  CALCIUM 9.1 8.4* 8.5* 9.2  MG  --  1.9  --   --    Liver Function Tests:  Recent Labs Lab 04/22/17 1801 04/25/17 2040  AST 28 30  ALT 13* 18  ALKPHOS 45 47  BILITOT 0.5 0.3  PROT 7.6 7.4  ALBUMIN 3.8 3.7   No results for input(s): LIPASE, AMYLASE in the last 168 hours. No results for input(s): AMMONIA in the last 168 hours. CBC:  Recent Labs Lab 04/22/17 1801  04/23/17 1403 04/24/17 0549 04/25/17 2040 04/26/17 0245 04/26/17 1523 04/27/17 0515  WBC 5.3  --  5.3 3.9* 5.9  --   --  7.2  NEUTROABS  --   --   --   --   --   --   --  4.3  HGB 11.4*  < > 10.2* 9.9* 10.8* 9.7* 10.1* 11.5*  HCT 34.7*  < > 30.9* 30.3* 33.1* 29.7* 30.8* 35.8*  MCV 88.3  --  89.0 89.4 88.3  --    --  89.3  PLT 174  --  151 168 178  --   --  214  < > = values in this interval not displayed. Cardiac Enzymes: No results for input(s): CKTOTAL, CKMB, CKMBINDEX, TROPONINI in the last 168 hours. BNP: Invalid input(s): POCBNP CBG: No results for input(s): GLUCAP in the last 168 hours. D-Dimer No results for input(s): DDIMER in the last 72 hours. Hgb A1c No results for input(s): HGBA1C in the last 72 hours. Lipid Profile No results for input(s): CHOL, HDL, LDLCALC, TRIG, CHOLHDL, LDLDIRECT in the last 72 hours. Thyroid function studies No results for input(s): TSH, T4TOTAL, T3FREE, THYROIDAB in the last 72 hours.  Invalid input(s): FREET3 Anemia work up No results for input(s): VITAMINB12, FOLATE, FERRITIN, TIBC, IRON, RETICCTPCT in the last 72 hours. Urinalysis    Component Value Date/Time   COLORURINE YELLOW 10/25/2015 2125   APPEARANCEUR CLEAR 10/25/2015 2125   LABSPEC 1.010 10/25/2015 2125   PHURINE 6.0 10/25/2015 2125   GLUCOSEU NEGATIVE 10/25/2015 2125   HGBUR NEGATIVE 10/25/2015 2125   BILIRUBINUR NEGATIVE 10/25/2015 2125   KETONESUR NEGATIVE 10/25/2015 2125   PROTEINUR NEGATIVE 10/25/2015 2125   UROBILINOGEN 0.2 02/17/2015 1502   NITRITE NEGATIVE 10/25/2015 2125   LEUKOCYTESUR NEGATIVE 10/25/2015 2125   Sepsis Labs Invalid input(s): PROCALCITONIN,  WBC,  LACTICIDVEN Microbiology No results found for this or any previous visit (from the past 240 hour(s)).   Time coordinating discharge: 45 minutes  SIGNED:   Coralie Keens, MD  [facility] 04/27/2017, 8:37 AM Pager   If 7PM-7AM, please contact night-coverage [email or web address] Password [email or web address]

## 2017-04-27 NOTE — Care Management (Addendum)
PCP   [facility]  Demographics  Comment    Last edited by  on at   Address: Home Phone: Work Civil engineer, contractinghone: Mobile Phone:  [address]  [place] [address]   [phone] -- --  SSN: Insurance: Marital Status: Religion:  [id number] UNITED HEALTHCARE MEDICARE Widowed [employer]  Patient Ethnicity & Race   Ethnic Group Patient Race  Not Hispanic or Latino Black or African American  Documents Filed to Patient   Power of Attorney Living Will Clinical Unknown Study Attachment Consent Form ABN Waiver After Visit Summary Lab Result Scan Code Status MyChart Status Advance Care Planning  Not on File Not on File Not on File Not on File Filed Not on File Filed Filed DNR [Updated on 04/25/17 2345] Pending Jump to the Activity  Admission Information   Attending Provider Admitting Provider Admission Type Admission Date/Time  Arrien, York RamMauricio Daniel, MD Onnie BoerEmokpae, Ejiroghene E, MD Emergency 04/25/17 2021  Discharge Date Hospital Service Auth/Cert Status Service Area   Internal Medicine Incomplete [place] SERVICE AREA  Unit Room/Bed Admission Status   AP-DEPT 300 A303/A303-01 Admission (Confirmed)   Hospital Account   Name Acct ID Class Status Primary Coverage  Donnetta HailHenderson, Ellissa H [id number] Observation Open [facility]      Guarantor Account (for Hospital Account [id number])   Name Relation to Pt Service Area Active? Acct Type  Donnetta HailHenderson, Geralynn H Self CHSA Yes Personal/Family  Address Phone    [address] [place], [place] [phone](H)        Coverage Information (for Hospital Account [id number])   1. Cleatrice BurkeUNITED HEALTHCARE MEDICARE/UHC MEDICARE   F/O Payor/Plan Precert #  [facility]IC[facility]   Subscriber Subscriber #  Donnetta HailHenderson, Shanedra H [id number]  Address Phone  [address] [place], [place] [phone] [phone]  2. MEDICAID [place]/MEDICAID [place] ACCESS   F/O Payor/Plan Precert #   MEDICAID [place]/MEDICAID [place] ACCESS   Subscriber Subscriber #  Donnetta HailHenderson, Angelynn H [id number]  Address Phone  [address] [address] [place], [place] [address] [phone]   SS # [id number]

## 2017-07-25 ENCOUNTER — Emergency Department (HOSPITAL_COMMUNITY)
Admission: EM | Admit: 2017-07-25 | Discharge: 2017-07-25 | Disposition: A | Discharge status: Home / Self Care | Payer: Medicare Other | Attending: Emergency Medicine | Admitting: Emergency Medicine

## 2017-07-25 ENCOUNTER — Encounter (HOSPITAL_COMMUNITY): Payer: Self-pay | Admitting: *Deleted

## 2017-07-25 DIAGNOSIS — R51 Headache: Secondary | ICD-10-CM | POA: Insufficient documentation

## 2017-07-25 DIAGNOSIS — R519 Headache, unspecified: Secondary | ICD-10-CM

## 2017-07-25 DIAGNOSIS — Z79899 Other long term (current) drug therapy: Secondary | ICD-10-CM | POA: Diagnosis not present

## 2017-07-25 DIAGNOSIS — I10 Essential (primary) hypertension: Secondary | ICD-10-CM | POA: Insufficient documentation

## 2017-07-25 DIAGNOSIS — F17200 Nicotine dependence, unspecified, uncomplicated: Secondary | ICD-10-CM | POA: Diagnosis not present

## 2017-07-25 LAB — CBG MONITORING, ED: Glucose-Capillary: 143 mg/dL — ABNORMAL HIGH (ref 65–99)

## 2017-07-25 NOTE — Discharge Instructions (Signed)
Take Tylenol as directed as needed for headaches. See your primary care physician if headaches continue. Return if concern for any reason

## 2017-07-25 NOTE — ED Triage Notes (Signed)
Pt reports headache and nausea since around 12pm today. Pt has hx of headaches. Pt states, "I just feel tired."

## 2017-07-25 NOTE — ED Provider Notes (Signed)
[facility] Provider Note   CSN: [id number] Arrival date & time: 07/25/17  1941     History   Chief Complaint Chief Complaint  Patient presents with  . Headache    HPI Lynn Nichols is a 83 y.o. female.Plains of diffuse headache onset this afternoon similar to headache she's had in the past. She is presently asymptomatic since she ate. No other associated symptoms. No other treatment prior to coming here. Symptoms resolve spontaneously. Nothing makes symptoms better or worse  HPI  Past Medical History:  Diagnosis Date  . Arthritis   . Back pain   . Diverticulosis   . Headache   . Hypertension     Patient Active Problem List   Diagnosis Date Noted  . Palliative care encounter   . Goals of care, counseling/discussion   . Encounter for hospice care discussion   . Lower GI bleed 04/22/2017  . Hypertension 04/22/2017  . Diverticulosis 04/22/2017  . Acute blood loss anemia 07/05/2014  . Acute lower gastrointestinal bleeding 06/30/2014  . Essential hypertension 06/30/2014  . Acute lower GI bleeding 06/30/2014    Past Surgical History:  Procedure Laterality Date  . ABDOMINAL HYSTERECTOMY    . CHOLECYSTECTOMY    . COLONOSCOPY N/A 07/01/2014   Procedure: COLONOSCOPY;  Surgeon: Malissa Hippo, MD;  Location: [facility];  Service: Endoscopy;  Laterality: N/A;  . COLONOSCOPY N/A 07/05/2014   Procedure: COLONOSCOPY;  Surgeon: Malissa Hippo, MD;  Location: [facility];  Service: Endoscopy;  Laterality: N/A;    OB History    Gravida Para Term Preterm AB Living   14 13 13   1 6    SAB TAB Ectopic Multiple Live Births   1               Home Medications    Prior to Admission medications   Medication Sig Start Date End Date Taking? Authorizing Provider  albuterol (PROAIR HFA) 108 (90 BASE) MCG/ACT inhaler Inhale 2 puffs into the lungs every 6 (six) hours as needed. For shortness of breath    [provider]  Camphor-Eucalyptus-Menthol (VICKS  VAPORUB EX) Apply 1 application topically daily as needed (pain).    [provider]  famotidine (PEPCID) 20 MG tablet Take 1 tablet (20 mg total) by mouth daily. 04/27/17   Arrien, York Ram, MD  ferrous sulfate 325 (65 FE) MG EC tablet Take 1 tablet (325 mg total) by mouth 2 (two) times daily with a meal. 04/24/17   Emokpae, Courage, MD  latanoprost (XALATAN) 0.005 % ophthalmic solution Place 1 drop into both eyes at bedtime.  09/26/15   [provider]  lisinopril (PRINIVIL,ZESTRIL) 10 MG tablet Take 10 mg by mouth daily.     [provider]  Menthol-Methyl Salicylate (MUSCLE RUB) 10-15 % CREA Apply 1 application topically as needed for muscle pain.    [provider]  montelukast (SINGULAIR) 10 MG tablet Take 1 tablet by mouth daily. 01/07/17   [provider]  NIFEdipine (PROCARDIA XL/ADALAT-CC) 90 MG 24 hr tablet Take 90 mg by mouth every morning.     [provider]  traMADol (ULTRAM) 50 MG tablet Take 50 mg by mouth 3 (three) times daily as needed for moderate pain.     [provider]    Family History Family History  Problem Relation Age of Onset  . Hypertension Mother   . Hypertension Father   . Diabetes Other     Social History Social History  Substance Use Topics  . Smoking status: Never Smoker  . Smokeless tobacco: Current User    Types: Snuff  . Alcohol use No     Allergies   Aspirin and Penicillins   Review of Systems Review of Systems  Musculoskeletal: Positive for gait problem.       Walks with cane  Neurological: Positive for headaches.  All other systems reviewed and are negative.    Physical Exam Updated Vital Signs BP (!) 139/48 (BP Location: Right Arm)   Pulse 81   Temp 98.2 F (36.8 C) (Oral)   Resp 18   Ht 5\' 3"  (1.6 m)   Wt 59 kg (130 lb)   SpO2 99%   BMI 23.03 kg/m   Physical Exam  Constitutional: She appears well-developed and well-nourished. No distress.  HENT:    Head: Normocephalic and atraumatic.  Eyes: Pupils are equal, round, and reactive to light. Conjunctivae are normal.  Neck: Neck supple. No tracheal deviation present. No thyromegaly present.  Cardiovascular: Normal rate and regular rhythm.   No murmur heard. Pulmonary/Chest: Effort normal and breath sounds normal.  Abdominal: Soft. Bowel sounds are normal. She exhibits no distension. There is no tenderness.  Musculoskeletal: Normal range of motion. She exhibits no edema or tenderness.  Neurological: She is alert. No cranial nerve deficit. Coordination normal.  Walks with cane without difficulty  Skin: Skin is warm and dry. No rash noted.  Psychiatric: She has a normal mood and affect.  Nursing note and vitals reviewed.    ED Treatments / Results  Labs (all labs ordered are listed, but only abnormal results are displayed) Labs Reviewed  CBG MONITORING, ED - Abnormal; Notable for the following:       Result Value   Glucose-Capillary 143 (*)    All other components within normal limits    EKG  EKG Interpretation None       Radiology No results found.  Procedures Procedures (including critical care time)  Medications Ordered in ED Medications - No data to display   Initial Impression / Assessment and Plan / ED Course  I have reviewed the triage vital signs and the nursing notes.  Pertinent labs & imaging results that were available during my care of the patient were reviewed by me and considered in my medical decision making (see chart for details).     8:05 PM remains asymptomatic. Plan Tylenol for headache as needed. Follow-up with PMD as needed  Final Clinical Impressions(s) / ED Diagnoses  Diagnosis :bad headache Final diagnoses:  None    New Prescriptions New Prescriptions   No medications on file     Doug Sou, MD 07/25/17 2009

## 2017-09-05 ENCOUNTER — Encounter (HOSPITAL_COMMUNITY): Payer: Self-pay

## 2017-09-05 ENCOUNTER — Emergency Department (HOSPITAL_COMMUNITY): Payer: Medicare Other

## 2017-09-05 ENCOUNTER — Emergency Department (HOSPITAL_COMMUNITY)
Admission: EM | Admit: 2017-09-05 | Discharge: 2017-09-05 | Disposition: A | Discharge status: Home / Self Care | Payer: Medicare Other | Attending: Emergency Medicine | Admitting: Emergency Medicine

## 2017-09-05 DIAGNOSIS — I10 Essential (primary) hypertension: Secondary | ICD-10-CM | POA: Insufficient documentation

## 2017-09-05 DIAGNOSIS — R079 Chest pain, unspecified: Secondary | ICD-10-CM | POA: Insufficient documentation

## 2017-09-05 DIAGNOSIS — Z79899 Other long term (current) drug therapy: Secondary | ICD-10-CM | POA: Insufficient documentation

## 2017-09-05 DIAGNOSIS — F17290 Nicotine dependence, other tobacco product, uncomplicated: Secondary | ICD-10-CM | POA: Diagnosis not present

## 2017-09-05 DIAGNOSIS — R110 Nausea: Secondary | ICD-10-CM | POA: Insufficient documentation

## 2017-09-05 DIAGNOSIS — M549 Dorsalgia, unspecified: Secondary | ICD-10-CM | POA: Insufficient documentation

## 2017-09-05 LAB — CBC WITH DIFFERENTIAL/PLATELET
BASOS ABS: 0 10*3/uL (ref 0.0–0.1)
Basophils Relative: 1 %
EOS PCT: 5 %
Eosinophils Absolute: 0.3 10*3/uL (ref 0.0–0.7)
HCT: 35.8 % — ABNORMAL LOW (ref 36.0–46.0)
HEMOGLOBIN: 11.7 g/dL — AB (ref 12.0–15.0)
LYMPHS ABS: 1.9 10*3/uL (ref 0.7–4.0)
LYMPHS PCT: 31 %
MCH: 29.1 pg (ref 26.0–34.0)
MCHC: 32.7 g/dL (ref 30.0–36.0)
MCV: 89.1 fL (ref 78.0–100.0)
Monocytes Absolute: 0.5 10*3/uL (ref 0.1–1.0)
Monocytes Relative: 8 %
NEUTROS PCT: 55 %
Neutro Abs: 3.3 10*3/uL (ref 1.7–7.7)
PLATELETS: 176 10*3/uL (ref 150–400)
RBC: 4.02 MIL/uL (ref 3.87–5.11)
RDW: 13.9 % (ref 11.5–15.5)
WBC: 6 10*3/uL (ref 4.0–10.5)

## 2017-09-05 LAB — BASIC METABOLIC PANEL
ANION GAP: 13 (ref 5–15)
BUN: 13 mg/dL (ref 6–20)
CHLORIDE: 101 mmol/L (ref 101–111)
CO2: 24 mmol/L (ref 22–32)
Calcium: 9 mg/dL (ref 8.9–10.3)
Creatinine, Ser: 0.85 mg/dL (ref 0.44–1.00)
GFR calc Af Amer: >60 mL/min (ref >60)
GFR, EST NON AFRICAN AMERICAN: 54 mL/min — AB (ref >60)
Glucose, Bld: 103 mg/dL — ABNORMAL HIGH (ref 65–99)
POTASSIUM: 3.2 mmol/L — AB (ref 3.5–5.1)
SODIUM: 138 mmol/L (ref 135–145)

## 2017-09-05 LAB — TROPONIN I

## 2017-09-05 MED ORDER — POTASSIUM CHLORIDE CRYS ER 20 MEQ PO TBCR
20.0000 meq | EXTENDED_RELEASE_TABLET | Freq: Once | ORAL | Status: AC
  Administered 2017-09-05: 20 meq via ORAL
  Filled 2017-09-05: qty 1

## 2017-09-05 NOTE — ED Triage Notes (Signed)
Pt reports chest pain intermittently for the past week. Pain occurs at rest and with activity . Seldom SOB  Pt also reports back pain

## 2017-09-05 NOTE — ED Provider Notes (Signed)
[facility] Provider Note   CSN: [id number] Arrival date & time: 09/05/17  1637     History   Chief Complaint Chief Complaint  Patient presents with  . Chest Pain    HPI Lynn Nichols is a 83 y.o. female.  Intermittent chest pain with radiation to the back, to 3 episodes per day, each lasting approximate 5 minutes. No dyspnea or diaphoresis. She does complain of nausea. She lives at home with help. Severity of symptoms is mild to moderate. Nothing makes symptoms better or worse.      Past Medical History:  Diagnosis Date  . Arthritis   . Back pain   . Diverticulosis   . Headache   . Hypertension     Patient Active Problem List   Diagnosis Date Noted  . Palliative care encounter   . Goals of care, counseling/discussion   . Encounter for hospice care discussion   . Lower GI bleed 04/22/2017  . Hypertension 04/22/2017  . Diverticulosis 04/22/2017  . Acute blood loss anemia 07/05/2014  . Acute lower gastrointestinal bleeding 06/30/2014  . Essential hypertension 06/30/2014  . Acute lower GI bleeding 06/30/2014    Past Surgical History:  Procedure Laterality Date  . ABDOMINAL HYSTERECTOMY    . CHOLECYSTECTOMY    . COLONOSCOPY N/A 07/01/2014   Procedure: COLONOSCOPY;  Surgeon: Malissa Hippo, MD;  [facility]: [facility];  Service: Endoscopy;  Laterality: N/A;  . COLONOSCOPY N/A 07/05/2014   Procedure: COLONOSCOPY;  Surgeon: Malissa Hippo, MD;  Location: [facility];  Service: Endoscopy;  Laterality: N/A;    OB History    Gravida Para Term Preterm AB Living   SAB TAB Ectopic Multiple Live Births   1               Home Medications    Prior to Admission medications   Medication Sig Start Date End Date Taking? Authorizing Provider  albuterol (PROAIR HFA) 108 (90 BASE) MCG/ACT inhaler Inhale 2 puffs into the lungs every 6 (six) hours as needed. For shortness of breath   Yes [provider]  Camphor-Eucalyptus-Menthol  (VICKS VAPORUB EX) Apply 1 application topically daily as needed (pain).   Yes [provider]  ferrous sulfate 325 (65 FE) MG EC tablet Take 1 tablet (325 mg total) by mouth 2 (two) times daily with a meal. Patient taking differently: Take 325 mg by mouth daily.  04/24/17  Yes Emokpae, Courage, MD  latanoprost (XALATAN) 0.005 % ophthalmic solution Place 1 drop into both eyes at bedtime.  09/26/15  Yes [provider]  lisinopril (PRINIVIL,ZESTRIL) 10 MG tablet Take 10 mg by mouth daily.    Yes [provider]  NIFEdipine (PROCARDIA-XL/ADALAT CC) 30 MG 24 hr tablet Take 1 tablet by mouth daily. 08/28/17  Yes [provider]  traMADol (ULTRAM) 50 MG tablet Take 50 mg by mouth 3 (three) times daily as needed for moderate pain.    Yes [provider]    Family History Family History  Problem Relation Age of Onset  . Hypertension Mother   . Hypertension Father   . Diabetes Other     Social History Social History  Substance Use Topics  . Smoking status: Never Smoker  . Smokeless tobacco: Current User    Types: Snuff  . Alcohol use No     Allergies   Aspirin and Penicillins   Review of Systems Review of Systems  All  other systems reviewed and are negative.    Physical Exam Updated Vital Signs BP (!) 157/65   Pulse 73   Temp 98.2 F (36.8 C) (Oral)   Resp 16   Wt 59 kg (130 lb)   SpO2 95%   BMI 23.03 kg/m   Physical Exam  Constitutional: She is oriented to person, place, and time. She appears well-developed and well-nourished.  HENT:  Head: Normocephalic and atraumatic.  Eyes: Conjunctivae are normal.  Neck: Neck supple.  Cardiovascular: Normal rate and regular rhythm.   Pulmonary/Chest: Effort normal and breath sounds normal.  Abdominal: Soft. Bowel sounds are normal.  Musculoskeletal: Normal range of motion.  Neurological: She is alert and oriented to person, place, and time.  Skin: Skin is warm and dry.  Psychiatric:  She has a normal mood and affect. Her behavior is normal.  Nursing note and vitals reviewed.    ED Treatments / Results  Labs (all labs ordered are listed, but only abnormal results are displayed) Labs Reviewed  CBC WITH DIFFERENTIAL/PLATELET - Abnormal; Notable for the following:       Result Value   Hemoglobin 11.7 (*)    HCT 35.8 (*)    All other components within normal limits  BASIC METABOLIC PANEL - Abnormal; Notable for the following:    Potassium 3.2 (*)    Glucose, Bld 103 (*)    GFR calc non Af Amer 54 (*)    All other components within normal limits  TROPONIN I    EKG  EKG Interpretation  Date/Time:  Sunday September 05 2017 16:46:30 EDT Ventricular Rate:  95 PR Interval:    QRS Duration: 83 QT Interval:  361 QTC Calculation: 440 R Axis:   -27 Text Interpretation:  Atrial flutter LVH by voltage Nonspecific T abnormalities, inferior leads Confirmed by Donnetta Hutching ([id number]) on 09/05/2017 6:41:24 PM       Radiology No results found.  Procedures Procedures (including critical care time)  Medications Ordered in ED Medications  potassium chloride SA (K-DUR,KLOR-CON) CR tablet 20 mEq (20 mEq Oral Given 09/05/17 1901)     Initial Impression / Assessment and Plan / ED Course  I have reviewed the triage vital signs and the nursing notes.  Pertinent labs & imaging results that were available during my care of the patient were reviewed by me and considered in my medical decision making (see chart for details).     EKG read by computer as atrial flutter but I do not think this is the persistent rhythm.  Troponin negative.  Patient is hemodynamically stable. Will follow-up with primary care.  Final Clinical Impressions(s) / ED Diagnoses   Final diagnoses:  Chest pain, unspecified type    New Prescriptions Discharge Medication List as of 09/05/2017  6:57 PM       Donnetta Hutching, MD 09/08/17 [phone]

## 2017-09-05 NOTE — Discharge Instructions (Signed)
Tests showed no life-threatening condition. Follow-up your primary care doctor on Thursday.

## 2017-09-05 NOTE — ED Provider Notes (Signed)
[facility] Provider Note   CSN: [id number] Arrival date & time: 09/05/17  1637     History   Chief Complaint Chief Complaint  Patient presents with  . Chest Pain    HPI Lynn Nichols is a 83 y.o. female.  Intermittent chest pain for 1 week not associated with any activity. She reports 2-3 episodes per day each lasting from 1-5 minutes. No dyspnea or diaphoresis. Slight nausea. She has hypertension but no diabetes. Severity of symptoms is mild.      Past Medical History:  Diagnosis Date  . Arthritis   . Back pain   . Diverticulosis   . Headache   . Hypertension     Patient Active Problem List   Diagnosis Date Noted  . Palliative care encounter   . Goals of care, counseling/discussion   . Encounter for hospice care discussion   . Lower GI bleed 04/22/2017  . Hypertension 04/22/2017  . Diverticulosis 04/22/2017  . Acute blood loss anemia 07/05/2014  . Acute lower gastrointestinal bleeding 06/30/2014  . Essential hypertension 06/30/2014  . Acute lower GI bleeding 06/30/2014    Past Surgical History:  Procedure Laterality Date  . ABDOMINAL HYSTERECTOMY    . CHOLECYSTECTOMY    . COLONOSCOPY N/A 07/01/2014   Procedure: COLONOSCOPY;  Surgeon: Malissa Hippo, MD;  [facility]: [facility];  Service: Endoscopy;  Laterality: N/A;  . COLONOSCOPY N/A 07/05/2014   Procedure: COLONOSCOPY;  Surgeon: Malissa Hippo, MD;  Location: [facility];  Service: Endoscopy;  Laterality: N/A;    OB History    Gravida Para Term Preterm AB Living   SAB TAB Ectopic Multiple Live Births   1               Home Medications    Prior to Admission medications   Medication Sig Start Date End Date Taking? Authorizing Provider  albuterol (PROAIR HFA) 108 (90 BASE) MCG/ACT inhaler Inhale 2 puffs into the lungs every 6 (six) hours as needed. For shortness of breath   Yes [provider]  Camphor-Eucalyptus-Menthol (VICKS VAPORUB EX) Apply 1  application topically daily as needed (pain).   Yes [provider]  ferrous sulfate 325 (65 FE) MG EC tablet Take 1 tablet (325 mg total) by mouth 2 (two) times daily with a meal. Patient taking differently: Take 325 mg by mouth daily.  04/24/17  Yes Emokpae, Courage, MD  latanoprost (XALATAN) 0.005 % ophthalmic solution Place 1 drop into both eyes at bedtime.  09/26/15  Yes [provider]  lisinopril (PRINIVIL,ZESTRIL) 10 MG tablet Take 10 mg by mouth daily.    Yes [provider]  NIFEdipine (PROCARDIA-XL/ADALAT CC) 30 MG 24 hr tablet Take 1 tablet by mouth daily. 08/28/17  Yes [provider]  traMADol (ULTRAM) 50 MG tablet Take 50 mg by mouth 3 (three) times daily as needed for moderate pain.    Yes [provider]    Family History Family History  Problem Relation Age of Onset  . Hypertension Mother   . Hypertension Father   . Diabetes Other     Social History Social History  Substance Use Topics  . Smoking status: Never Smoker  . Smokeless tobacco: Current User    Types: Snuff  . Alcohol use No     Allergies   Aspirin and Penicillins   Review of Systems Review of Systems  All other systems reviewed and are negative.  Physical Exam Updated Vital Signs BP (!) 157/65   Pulse 73   Temp 98.2 F (36.8 C) (Oral)   Resp 16   Wt 59 kg (130 lb)   SpO2 95%   BMI 23.03 kg/m   Physical Exam  Constitutional: She is oriented to person, place, and time. She appears well-developed and well-nourished.  HENT:  Head: Normocephalic and atraumatic.  Eyes: Conjunctivae are normal.  Neck: Neck supple.  Cardiovascular: Normal rate and regular rhythm.   Pulmonary/Chest: Effort normal and breath sounds normal.  Abdominal: Soft. Bowel sounds are normal.  Musculoskeletal: Normal range of motion.  Neurological: She is alert and oriented to person, place, and time.  Skin: Skin is warm and dry.  Psychiatric: She has a normal mood and  affect. Her behavior is normal.  Nursing note and vitals reviewed.    ED Treatments / Results  Labs (all labs ordered are listed, but only abnormal results are displayed) Labs Reviewed  CBC WITH DIFFERENTIAL/PLATELET - Abnormal; Notable for the following:       Result Value   Hemoglobin 11.7 (*)    HCT 35.8 (*)    All other components within normal limits  BASIC METABOLIC PANEL - Abnormal; Notable for the following:    Potassium 3.2 (*)    Glucose, Bld 103 (*)    GFR calc non Af Amer 54 (*)    All other components within normal limits  TROPONIN I    EKG  EKG Interpretation  Date/Time:  Sunday September 05 2017 16:46:30 EDT Ventricular Rate:  95 PR Interval:    QRS Duration: 83 QT Interval:  361 QTC Calculation: 440 R Axis:   -27 Text Interpretation:  Atrial flutter LVH by voltage Nonspecific T abnormalities, inferior leads Confirmed by Donnetta Hutching ([id number]) on 09/05/2017 6:41:24 PM       Radiology Dg Chest Port 1 View  Result Date: 09/05/2017 CLINICAL DATA:  Chest pain. EXAM: PORTABLE CHEST 1 VIEW COMPARISON:  Chest x-ray dated March 11, 2017. FINDINGS: Mild cardiomegaly, unchanged. Normal pulmonary vascularity. Atherosclerotic calcification of the aortic arch. No focal consolidation, pleural effusion, or pneumothorax. No acute osseous abnormality. IMPRESSION: Mild cardiomegaly, unchanged.  No active disease. Electronically Signed   By: Obie Dredge M.D.   On: 09/05/2017 17:13    Procedures Procedures (including critical care time)  Medications Ordered in ED Medications  potassium chloride SA (K-DUR,KLOR-CON) CR tablet 20 mEq (20 mEq Oral Given 09/05/17 1901)     Initial Impression / Assessment and Plan / ED Course  I have reviewed the triage vital signs and the nursing notes.  Pertinent labs & imaging results that were available during my care of the patient were reviewed by me and considered in my medical decision making (see chart for details).     Patient  is hemodynamically stable.  There may be an intermittent component of atrial flutter with her EKG. Hemoglobin stable. Troponin negative. Chest x-ray shows no active disease. She has follow-up on Thursday this week.  Final Clinical Impressions(s) / ED Diagnoses   Final diagnoses:  Chest pain, unspecified type    New Prescriptions New Prescriptions   No medications on file     Donnetta Hutching, MD 09/05/17 1910

## 2017-10-18 ENCOUNTER — Emergency Department (HOSPITAL_COMMUNITY)
Admission: EM | Admit: 2017-10-18 | Discharge: 2017-10-18 | Disposition: A | Discharge status: Home / Self Care | Payer: Medicare Other

## 2017-10-19 ENCOUNTER — Encounter (HOSPITAL_COMMUNITY): Payer: Self-pay | Admitting: *Deleted

## 2017-10-19 ENCOUNTER — Other Ambulatory Visit: Payer: Self-pay

## 2017-10-19 ENCOUNTER — Emergency Department (HOSPITAL_COMMUNITY)
Admission: EM | Admit: 2017-10-19 | Discharge: 2017-10-19 | Disposition: A | Discharge status: Home / Self Care | Payer: Medicare Other | Attending: Emergency Medicine | Admitting: Emergency Medicine

## 2017-10-19 DIAGNOSIS — R195 Other fecal abnormalities: Secondary | ICD-10-CM | POA: Insufficient documentation

## 2017-10-19 DIAGNOSIS — I10 Essential (primary) hypertension: Secondary | ICD-10-CM | POA: Diagnosis not present

## 2017-10-19 DIAGNOSIS — F17290 Nicotine dependence, other tobacco product, uncomplicated: Secondary | ICD-10-CM | POA: Insufficient documentation

## 2017-10-19 DIAGNOSIS — Z79899 Other long term (current) drug therapy: Secondary | ICD-10-CM | POA: Diagnosis not present

## 2017-10-19 LAB — CBC WITH DIFFERENTIAL/PLATELET
BASOS PCT: 0 %
Basophils Absolute: 0 10*3/uL (ref 0.0–0.1)
Eosinophils Absolute: 0.1 10*3/uL (ref 0.0–0.7)
Eosinophils Relative: 3 %
HEMATOCRIT: 41.3 % (ref 36.0–46.0)
HEMOGLOBIN: 13 g/dL (ref 12.0–15.0)
LYMPHS PCT: 24 %
Lymphs Abs: 1.3 10*3/uL (ref 0.7–4.0)
MCH: 29 pg (ref 26.0–34.0)
MCHC: 31.5 g/dL (ref 30.0–36.0)
MCV: 92.2 fL (ref 78.0–100.0)
MONO ABS: 0.4 10*3/uL (ref 0.1–1.0)
MONOS PCT: 7 %
NEUTROS ABS: 3.4 10*3/uL (ref 1.7–7.7)
NEUTROS PCT: 66 %
Platelets: 177 10*3/uL (ref 150–400)
RBC: 4.48 MIL/uL (ref 3.87–5.11)
RDW: 14.3 % (ref 11.5–15.5)
WBC: 5.2 10*3/uL (ref 4.0–10.5)

## 2017-10-19 LAB — POC OCCULT BLOOD, ED: Fecal Occult Bld: NEGATIVE

## 2017-10-19 NOTE — ED Provider Notes (Signed)
Emergency Department Provider Note   I have reviewed the triage vital signs and the nursing notes.   HISTORY  Chief Complaint Rectal Bleeding   HPI Lynn Nichols is a [age] y.o. female discharge in the hospital a couple months ago on iron and has had dark stools ever since then.  No bright red blood in her stools.  No other symptoms.  No abdominal pain, chest pain, shortness of breath, lightheadedness, dizziness or feelings you pass out.  No pale skin.  No other associated or modifying symptoms.  Does not do anything to evaluate this.  No changes in dietary habits.  Past Medical History:  Diagnosis Date  . Arthritis   . Back pain   . Diverticulosis   . Headache   . Hypertension     Patient Active Problem List   Diagnosis Date Noted  . Palliative care encounter   . Goals of care, counseling/discussion   . Encounter for hospice care discussion   . Lower GI bleed 04/22/2017  . Hypertension 04/22/2017  . Diverticulosis 04/22/2017  . Acute blood loss anemia 07/05/2014  . Acute lower gastrointestinal bleeding 06/30/2014  . Essential hypertension 06/30/2014  . Acute lower GI bleeding 06/30/2014    Past Surgical History:  Procedure Laterality Date  . ABDOMINAL HYSTERECTOMY    . CHOLECYSTECTOMY    . COLONOSCOPY N/A 07/01/2014   Procedure: COLONOSCOPY;  Surgeon: Malissa HippoNajeeb U Rehman, MD;  Location: [facility];  Service: Endoscopy;  Laterality: N/A;  . COLONOSCOPY N/A 07/05/2014   Procedure: COLONOSCOPY;  Surgeon: Malissa HippoNajeeb U Rehman, MD;  Location: [facility];  Service: Endoscopy;  Laterality: N/A;    Current Outpatient Rx  . Order #: [id number] Class: Historical Med  . Order #: [id number] Class: Normal  . Order #: [id number] Class: Historical Med  . Order #: [id number] Class: Historical Med  . Order #: [id number] Class: Historical Med  . Order #: [id number] Class: Historical Med  . Order #: [id number] Class: Historical Med    Allergies Aspirin and Penicillins  Family History    Problem Relation Age of Onset  . Hypertension Mother   . Hypertension Father   . Diabetes Other     Social History Social History   Tobacco Use  . Smoking status: Never Smoker  . Smokeless tobacco: Current User    Types: Snuff  Substance Use Topics  . Alcohol use: No    Alcohol/week: 0.0 oz  . Drug use: No    Review of Systems  All other systems negative except as documented in the HPI. All pertinent positives and negatives as reviewed in the HPI. ____________________________________________   PHYSICAL EXAM:  VITAL SIGNS: ED Triage Vitals  Enc Vitals Group     BP 10/19/17 1209 (!) 162/82     Pulse Rate 10/19/17 1209 97     Resp 10/19/17 1209 18     Temp 10/19/17 1209 98.5 F (36.9 C)     Temp Source 10/19/17 1209 Oral     SpO2 10/19/17 1209 97 %     Weight 10/19/17 1210 128 lb (58.1 kg)     Height 10/19/17 1210 5\' 4"  (1.626 m)    Constitutional: Alert and oriented. Well appearing and in no acute distress. Eyes: Conjunctivae are normal. PERRL. EOMI. Head: Atraumatic. Nose: No congestion/rhinnorhea. Mouth/Throat: Mucous membranes are moist.  Oropharynx non-erythematous. Neck: No stridor.  No meningeal signs.   Cardiovascular: Normal rate, regular rhythm. Good peripheral circulation. Grossly normal heart sounds.   Respiratory: Normal respiratory effort.  No retractions. Lungs  CTAB. Gastrointestinal: Soft and nontender. No distention.  Musculoskeletal: No lower extremity tenderness nor edema. No gross deformities of extremities. Neurologic:  Normal speech and language. No gross focal neurologic deficits are appreciated.  Skin:  Skin is warm, dry and intact. No rash noted.   ____________________________________________   LABS (all labs ordered are listed, but only abnormal results are displayed)  Labs Reviewed  CBC WITH DIFFERENTIAL/PLATELET  POC OCCULT BLOOD, ED   ____________________________________________   PROCEDURES  Procedure(s) performed:    Procedures   ____________________________________________   INITIAL IMPRESSION / ASSESSMENT AND PLAN / ED COURSE  Pertinent labs & imaging results that were available during my care of the patient were reviewed by me and considered in my medical decision making (see chart for details).  Hemoccult negative.  CBC normal.  Likely related to the iron.  Will follow up with PCP for further management.   ____________________________________________  FINAL CLINICAL IMPRESSION(S) / ED DIAGNOSES  Final diagnoses:  Dark stools     MEDICATIONS GIVEN DURING THIS VISIT:  Medications - No data to display   NEW OUTPATIENT MEDICATIONS STARTED DURING THIS VISIT:  This SmartLink is deprecated. Use AVSMEDLIST instead to display the medication list for a patient.  Note:  This document was prepared using Dragon voice recognition software and may include unintentional dictation errors.   Marily MemosMesner, Agostino Gorin, MD 10/19/17 [phone]

## 2017-10-19 NOTE — Discharge Instructions (Signed)
I believe your black stools are secondary to iron administration.  Hemoglobin is normal and you have no other evidence of acute blood loss anemia.  Continue taking iron as directed by prescriber.  Please return here with any new symptoms or bright red blood in stools.

## 2017-10-19 NOTE — ED Triage Notes (Signed)
Pt c/o black colored stool x 2-3 months when she was d/c from hospital. Pt reports she takes iron supplements. Pt reports weakness x couple of days.

## 2017-11-17 ENCOUNTER — Encounter (HOSPITAL_COMMUNITY): Payer: Self-pay | Admitting: Emergency Medicine

## 2017-11-17 ENCOUNTER — Emergency Department (HOSPITAL_COMMUNITY): Payer: Medicare Other

## 2017-11-17 ENCOUNTER — Other Ambulatory Visit: Payer: Self-pay

## 2017-11-17 ENCOUNTER — Inpatient Hospital Stay (HOSPITAL_COMMUNITY)
Admission: EM | Admit: 2017-11-17 | Discharge: 2017-11-19 | DRG: 377 | Disposition: A | Discharge status: Home / Self Care | Payer: Medicare Other | Attending: Internal Medicine | Admitting: Internal Medicine

## 2017-11-17 DIAGNOSIS — F17290 Nicotine dependence, other tobacco product, uncomplicated: Secondary | ICD-10-CM | POA: Diagnosis present

## 2017-11-17 DIAGNOSIS — Z79899 Other long term (current) drug therapy: Secondary | ICD-10-CM | POA: Diagnosis not present

## 2017-11-17 DIAGNOSIS — I10 Essential (primary) hypertension: Secondary | ICD-10-CM | POA: Diagnosis not present

## 2017-11-17 DIAGNOSIS — Z90710 Acquired absence of both cervix and uterus: Secondary | ICD-10-CM | POA: Diagnosis not present

## 2017-11-17 DIAGNOSIS — K922 Gastrointestinal hemorrhage, unspecified: Secondary | ICD-10-CM | POA: Diagnosis not present

## 2017-11-17 DIAGNOSIS — Z886 Allergy status to analgesic agent status: Secondary | ICD-10-CM

## 2017-11-17 DIAGNOSIS — J189 Pneumonia, unspecified organism: Secondary | ICD-10-CM | POA: Diagnosis present

## 2017-11-17 DIAGNOSIS — D62 Acute posthemorrhagic anemia: Secondary | ICD-10-CM | POA: Diagnosis present

## 2017-11-17 DIAGNOSIS — Z8711 Personal history of peptic ulcer disease: Secondary | ICD-10-CM

## 2017-11-17 DIAGNOSIS — D696 Thrombocytopenia, unspecified: Secondary | ICD-10-CM | POA: Diagnosis present

## 2017-11-17 DIAGNOSIS — K625 Hemorrhage of anus and rectum: Secondary | ICD-10-CM | POA: Diagnosis present

## 2017-11-17 DIAGNOSIS — K5751 Diverticulosis of both small and large intestine without perforation or abscess with bleeding: Secondary | ICD-10-CM | POA: Diagnosis not present

## 2017-11-17 DIAGNOSIS — R918 Other nonspecific abnormal finding of lung field: Secondary | ICD-10-CM | POA: Diagnosis present

## 2017-11-17 DIAGNOSIS — Z8719 Personal history of other diseases of the digestive system: Secondary | ICD-10-CM

## 2017-11-17 DIAGNOSIS — Z880 Allergy status to penicillin: Secondary | ICD-10-CM

## 2017-11-17 DIAGNOSIS — R778 Other specified abnormalities of plasma proteins: Secondary | ICD-10-CM

## 2017-11-17 DIAGNOSIS — R7989 Other specified abnormal findings of blood chemistry: Secondary | ICD-10-CM

## 2017-11-17 DIAGNOSIS — R748 Abnormal levels of other serum enzymes: Secondary | ICD-10-CM | POA: Diagnosis present

## 2017-11-17 DIAGNOSIS — E876 Hypokalemia: Secondary | ICD-10-CM | POA: Diagnosis present

## 2017-11-17 DIAGNOSIS — Z66 Do not resuscitate: Secondary | ICD-10-CM | POA: Diagnosis not present

## 2017-11-17 DIAGNOSIS — K5790 Diverticulosis of intestine, part unspecified, without perforation or abscess without bleeding: Secondary | ICD-10-CM | POA: Diagnosis present

## 2017-11-17 DIAGNOSIS — K921 Melena: Secondary | ICD-10-CM

## 2017-11-17 HISTORY — DX: Gastrointestinal hemorrhage, unspecified: K92.2

## 2017-11-17 LAB — CBC
HEMATOCRIT: 28.9 % — AB (ref 36.0–46.0)
HEMOGLOBIN: 9.2 g/dL — AB (ref 12.0–15.0)
MCH: 29.1 pg (ref 26.0–34.0)
MCHC: 31.8 g/dL (ref 30.0–36.0)
MCV: 91.5 fL (ref 78.0–100.0)
Platelets: 134 10*3/uL — ABNORMAL LOW (ref 150–400)
RBC: 3.16 MIL/uL — ABNORMAL LOW (ref 3.87–5.11)
RDW: 13.9 % (ref 11.5–15.5)
WBC: 6.5 10*3/uL (ref 4.0–10.5)

## 2017-11-17 LAB — COMPREHENSIVE METABOLIC PANEL
ALT: 9 U/L — AB (ref 14–54)
AST: 19 U/L (ref 15–41)
Albumin: 3.3 g/dL — ABNORMAL LOW (ref 3.5–5.0)
Alkaline Phosphatase: 42 U/L (ref 38–126)
Anion gap: 11 (ref 5–15)
BILIRUBIN TOTAL: 0.6 mg/dL (ref 0.3–1.2)
BUN: 21 mg/dL — AB (ref 6–20)
CO2: 25 mmol/L (ref 22–32)
CREATININE: 0.9 mg/dL (ref 0.44–1.00)
Calcium: 8.4 mg/dL — ABNORMAL LOW (ref 8.9–10.3)
Chloride: 104 mmol/L (ref 101–111)
GFR calc Af Amer: 58 mL/min — ABNORMAL LOW (ref >60)
GFR, EST NON AFRICAN AMERICAN: 50 mL/min — AB (ref >60)
Glucose, Bld: 151 mg/dL — ABNORMAL HIGH (ref 65–99)
Potassium: 3.4 mmol/L — ABNORMAL LOW (ref 3.5–5.1)
Sodium: 140 mmol/L (ref 135–145)
TOTAL PROTEIN: 6.7 g/dL (ref 6.5–8.1)

## 2017-11-17 LAB — POC OCCULT BLOOD, ED: Fecal Occult Bld: POSITIVE — AB

## 2017-11-17 LAB — LACTIC ACID, PLASMA
LACTIC ACID, VENOUS: 1.5 mmol/L (ref 0.5–1.9)
Lactic Acid, Venous: 1.4 mmol/L (ref 0.5–1.9)

## 2017-11-17 LAB — CBC WITH DIFFERENTIAL/PLATELET
BASOS ABS: 0 10*3/uL (ref 0.0–0.1)
Basophils Relative: 0 %
EOS ABS: 0.1 10*3/uL (ref 0.0–0.7)
EOS PCT: 1 %
HCT: 32.8 % — ABNORMAL LOW (ref 36.0–46.0)
Hemoglobin: 10.5 g/dL — ABNORMAL LOW (ref 12.0–15.0)
LYMPHS ABS: 0.8 10*3/uL (ref 0.7–4.0)
Lymphocytes Relative: 11 %
MCH: 29.2 pg (ref 26.0–34.0)
MCHC: 32 g/dL (ref 30.0–36.0)
MCV: 91.4 fL (ref 78.0–100.0)
Monocytes Absolute: 0.5 10*3/uL (ref 0.1–1.0)
Monocytes Relative: 6 %
Neutro Abs: 6.2 10*3/uL (ref 1.7–7.7)
Neutrophils Relative %: 82 %
PLATELETS: 158 10*3/uL (ref 150–400)
RBC: 3.59 MIL/uL — AB (ref 3.87–5.11)
RDW: 13.7 % (ref 11.5–15.5)
WBC: 7.6 10*3/uL (ref 4.0–10.5)

## 2017-11-17 LAB — URINALYSIS, ROUTINE W REFLEX MICROSCOPIC
BACTERIA UA: NONE SEEN
BILIRUBIN URINE: NEGATIVE
Glucose, UA: NEGATIVE mg/dL
HGB URINE DIPSTICK: NEGATIVE
KETONES UR: NEGATIVE mg/dL
Nitrite: NEGATIVE
PROTEIN: 30 mg/dL — AB
Specific Gravity, Urine: 1.019 (ref 1.005–1.030)
pH: 5 (ref 5.0–8.0)

## 2017-11-17 LAB — TYPE AND SCREEN
ABO/RH(D): A POS
Antibody Screen: NEGATIVE

## 2017-11-17 LAB — PROTIME-INR
INR: 1.1
Prothrombin Time: 14.1 seconds (ref 11.4–15.2)

## 2017-11-17 LAB — TROPONIN I
TROPONIN I: 0.03 ng/mL — AB (ref <0.03)
TROPONIN I: 0.03 ng/mL — AB (ref <0.03)

## 2017-11-17 LAB — SAMPLE TO BLOOD BANK

## 2017-11-17 MED ORDER — CEFTRIAXONE SODIUM 1 G IJ SOLR
1.0000 g | INTRAMUSCULAR | Status: DC
  Filled 2017-11-17: qty 10

## 2017-11-17 MED ORDER — LORAZEPAM 2 MG/ML IJ SOLN
INTRAMUSCULAR | Status: AC
  Administered 2017-11-17: 0.25 mg via INTRAVENOUS
  Filled 2017-11-17: qty 1

## 2017-11-17 MED ORDER — LORAZEPAM 2 MG/ML IJ SOLN
0.2500 mg | Freq: Once | INTRAMUSCULAR | Status: AC
  Administered 2017-11-17: 0.25 mg via INTRAVENOUS

## 2017-11-17 MED ORDER — TRAMADOL HCL 50 MG PO TABS
50.0000 mg | ORAL_TABLET | Freq: Three times a day (TID) | ORAL | Status: DC | PRN
  Administered 2017-11-18: 50 mg via ORAL
  Filled 2017-11-17 (×3): qty 1

## 2017-11-17 MED ORDER — AZITHROMYCIN 500 MG IV SOLR
500.0000 mg | Freq: Once | INTRAVENOUS | Status: AC
  Administered 2017-11-17: 500 mg via INTRAVENOUS
  Filled 2017-11-17: qty 500

## 2017-11-17 MED ORDER — DEXTROSE 5 % IV SOLN
500.0000 mg | INTRAVENOUS | Status: DC
  Filled 2017-11-17: qty 500

## 2017-11-17 MED ORDER — IOPAMIDOL (ISOVUE-300) INJECTION 61%
100.0000 mL | Freq: Once | INTRAVENOUS | Status: AC | PRN
  Administered 2017-11-17: 100 mL via INTRAVENOUS

## 2017-11-17 MED ORDER — NIFEDIPINE ER OSMOTIC RELEASE 30 MG PO TB24
30.0000 mg | ORAL_TABLET | Freq: Every day | ORAL | Status: DC
  Administered 2017-11-17 – 2017-11-19 (×3): 30 mg via ORAL
  Filled 2017-11-17 (×3): qty 1

## 2017-11-17 MED ORDER — POTASSIUM CHLORIDE CRYS ER 20 MEQ PO TBCR
40.0000 meq | EXTENDED_RELEASE_TABLET | Freq: Once | ORAL | Status: AC
  Administered 2017-11-17: 40 meq via ORAL
  Filled 2017-11-17: qty 2

## 2017-11-17 MED ORDER — DEXTROSE 5 % IV SOLN
1.0000 g | Freq: Once | INTRAVENOUS | Status: AC
  Administered 2017-11-17: 1 g via INTRAVENOUS
  Filled 2017-11-17: qty 10

## 2017-11-17 MED ORDER — ONDANSETRON HCL 4 MG PO TABS: 4.0000 mg | ORAL_TABLET | Freq: Four times a day (QID) | ORAL | Status: DC | PRN

## 2017-11-17 MED ORDER — SODIUM CHLORIDE 0.9 % IV SOLN
INTRAVENOUS | Status: AC
  Administered 2017-11-17: 17:00:00 via INTRAVENOUS

## 2017-11-17 MED ORDER — SENNOSIDES-DOCUSATE SODIUM 8.6-50 MG PO TABS: 1.0000 | ORAL_TABLET | Freq: Every evening | ORAL | Status: DC | PRN

## 2017-11-17 MED ORDER — DOCUSATE SODIUM 100 MG PO CAPS: 100.0000 mg | ORAL_CAPSULE | Freq: Every day | ORAL | Status: DC | PRN

## 2017-11-17 MED ORDER — ONDANSETRON HCL 4 MG/2ML IJ SOLN: 4.0000 mg | Freq: Four times a day (QID) | INTRAMUSCULAR | Status: DC | PRN

## 2017-11-17 MED ORDER — SODIUM CHLORIDE 0.9 % IV SOLN
INTRAVENOUS | Status: DC
  Administered 2017-11-17 – 2017-11-18 (×3): via INTRAVENOUS

## 2017-11-17 MED ORDER — ALBUTEROL SULFATE (2.5 MG/3ML) 0.083% IN NEBU: 3.0000 mL | INHALATION_SOLUTION | RESPIRATORY_TRACT | Status: DC | PRN

## 2017-11-17 MED ORDER — FERROUS SULFATE 325 (65 FE) MG PO TBEC: 325.0000 mg | DELAYED_RELEASE_TABLET | Freq: Two times a day (BID) | ORAL | Status: DC

## 2017-11-17 MED ORDER — LATANOPROST 0.005 % OP SOLN
1.0000 [drp] | Freq: Every day | OPHTHALMIC | Status: DC
  Administered 2017-11-17 – 2017-11-18 (×2): 1 [drp] via OPHTHALMIC
  Filled 2017-11-17 (×2): qty 2.5

## 2017-11-17 MED ORDER — ALBUTEROL SULFATE HFA 108 (90 BASE) MCG/ACT IN AERS: 2.0000 | INHALATION_SPRAY | RESPIRATORY_TRACT | Status: DC | PRN

## 2017-11-17 MED ORDER — ACETAMINOPHEN 650 MG RE SUPP
650.0000 mg | Freq: Four times a day (QID) | RECTAL | Status: DC | PRN
Start: 2017-11-17 — End: 2017-11-19

## 2017-11-17 MED ORDER — SODIUM CHLORIDE 0.9 % IV SOLN
INTRAVENOUS | Status: DC
  Administered 2017-11-17 (×2): via INTRAVENOUS

## 2017-11-17 MED ORDER — FERROUS SULFATE 325 (65 FE) MG PO TABS
325.0000 mg | ORAL_TABLET | Freq: Two times a day (BID) | ORAL | Status: DC
  Administered 2017-11-17 – 2017-11-18 (×2): 325 mg via ORAL
  Filled 2017-11-17 (×3): qty 1

## 2017-11-17 MED ORDER — ACETAMINOPHEN 325 MG PO TABS: 650.0000 mg | ORAL_TABLET | Freq: Four times a day (QID) | ORAL | Status: DC | PRN

## 2017-11-17 NOTE — ED Notes (Signed)
Report given to Esperanza RichtersJennifer Kendrick, RN

## 2017-11-17 NOTE — H&P (Signed)
History and Physical    Lynn Nichols ZOX:[id number] DOB: May 17, 1914 DOA: 11/17/2017  Referring MD/NP/PA: Samuel JesterKathleen McManus, EDP [facility]  Patient coming from: Home  Chief Complaint: Rectal bleeding  HPI: Lynn Hailnnie H Commisso is a [age] y.o. female with a history of prior GI bleeding who last had a colonoscopy in 2015 with findings of diverticulosis and a rectal ulcer.  She has had several admissions since for rectal bleeding and if patient and family have elected not to pursue colonoscopy.  Patient and daughter stated that she had a bloody bowel movement this morning and was brought to the hospital.  While in the ED has had at least 2 more episodes and had another episode upon arrival to the floor.  She is hemodynamically stable, her hemoglobin is 10.5, was 13 about a month ago.  Labs are significant for potassium of 3.4, troponin of 0.03.  Admission requested.   Past Medical/Surgical History: Past Medical History:  Diagnosis Date  . Arthritis   . Back pain   . Diverticulosis   . Headache   . Hypertension   . Lower GI bleeding     Past Surgical History:  Procedure Laterality Date  . ABDOMINAL HYSTERECTOMY    . CHOLECYSTECTOMY    . COLONOSCOPY N/A 07/01/2014   Procedure: COLONOSCOPY;  Surgeon: Malissa HippoNajeeb U Rehman, MD;  Location: [facility];  Service: Endoscopy;  Laterality: N/A;  . COLONOSCOPY N/A 07/05/2014   Procedure: COLONOSCOPY;  Surgeon: Malissa HippoNajeeb U Rehman, MD;  Location: [facility];  Service: Endoscopy;  Laterality: N/A;    Social History:  reports that  has never smoked. Her smokeless tobacco use includes snuff. She reports that she does not drink alcohol or use drugs.  Allergies: Allergies  Allergen Reactions  . Aspirin     Advised by MD not take due to bleed  . Penicillins Other (See Comments)    Has patient had a PCN reaction causing immediate rash, facial/tongue/throat swelling, SOB or lightheadedness with hypotension: No Has  patient had a PCN reaction causing severe rash involving mucus membranes or skin necrosis: No Has patient had a PCN reaction that required hospitalization YES Has patient had a PCN reaction occurring within the last 10 years: No If all of the above answers are "NO", then may proceed with Cephalosporin use. "PASSES OUT"    Family History:  Family History  Problem Relation Age of Onset  . Hypertension Mother   . Hypertension Father   . Diabetes Other     Prior to Admission medications   Medication Sig Start Date End Date Taking? Authorizing Provider  albuterol (PROAIR HFA) 108 (90 BASE) MCG/ACT inhaler Inhale 2 puffs into the lungs every 6 (six) hours as needed. For shortness of breath   Yes [provider]  Camphor-Eucalyptus-Menthol (VICKS VAPORUB EX) Apply 1 application topically daily as needed (pain).   Yes [provider]  docusate sodium (COLACE) 100 MG capsule Take 100 mg by mouth daily as needed for mild constipation.   Yes [provider]  ferrous sulfate 325 (65 FE) MG EC tablet Take 1 tablet (325 mg total) by mouth 2 (two) times daily with a meal. Patient taking differently: Take 325 mg by mouth daily.  04/24/17  Yes Emokpae, Courage, MD  latanoprost (XALATAN) 0.005 % ophthalmic solution Place 1 drop into both eyes at bedtime.  09/26/15  Yes [provider]  NIFEdipine (PROCARDIA-XL/ADALAT CC) 30 MG 24 hr tablet Take 1 tablet by mouth  daily. 08/28/17  Yes [provider]  traMADol (ULTRAM) 50 MG tablet Take 50 mg by mouth 3 (three) times daily as needed for moderate pain.    Yes [provider]    Review of Systems:  Constitutional: Denies fever, chills, diaphoresis, appetite change and fatigue.  HEENT: Denies photophobia, eye pain, redness, hearing loss, ear pain, congestion, sore throat, rhinorrhea, sneezing, mouth sores, trouble swallowing, neck pain, neck stiffness and tinnitus.   Respiratory: Denies SOB, DOE, cough,  chest tightness,  and wheezing.   Cardiovascular: Denies chest pain, palpitations and leg swelling.  Gastrointestinal: Denies nausea, vomiting, abdominal pain, diarrhea, constipation, blood in stool and abdominal distention.  Genitourinary: Denies dysuria, urgency, frequency, hematuria, flank pain and difficulty urinating.  Endocrine: Denies: hot or cold intolerance, sweats, changes in hair or nails, polyuria, polydipsia. Musculoskeletal: Denies myalgias, back pain, joint swelling, arthralgias and gait problem.  Skin: Denies pallor, rash and wound.  Neurological: Denies dizziness, seizures, syncope, weakness, light-headedness, numbness and headaches.  Hematological: Denies adenopathy. Easy bruising, personal or family bleeding history  Psychiatric/Behavioral: Denies suicidal ideation, mood changes, confusion, nervousness, sleep disturbance and agitation    Physical Exam: Vitals:   11/17/17 1400 11/17/17 1420 11/17/17 1430 11/17/17 1526  BP: 112/61  116/64 (!) 140/59  Pulse: 80  73 72  Resp: (!) 22  20 17   Temp:  98 F (36.7 C)  97.8 F (36.6 C)  TempSrc:  Oral    SpO2: 100%  100% 100%  Weight:      Height:         Constitutional: NAD, calm, comfortable Eyes: PERRL, lids and conjunctivae normal ENMT: Mucous membranes are moist. Posterior pharynx clear of any exudate or lesions.Normal dentition.  Neck: normal, supple, no masses, no thyromegaly Respiratory: clear to auscultation bilaterally, no wheezing, no crackles. Normal respiratory effort. No accessory muscle use.  Cardiovascular: Regular rate and rhythm, no murmurs / rubs / gallops. No extremity edema. 2+ pedal pulses. No carotid bruits.  Abdomen: no tenderness, no masses palpated. No hepatosplenomegaly. Bowel sounds positive.  Musculoskeletal: no clubbing / cyanosis. No joint deformity upper and lower extremities. Good ROM, no contractures. Normal muscle tone.  Skin: no rashes, lesions, ulcers. No induration Neurologic: CN  2-12 grossly intact. Sensation intact, DTR normal. Strength 5/5 in all 4.  Psychiatric: Normal judgment and insight. Alert and oriented x 3. Normal mood.    Labs on Admission: I have personally reviewed the following labs and imaging studies  CBC: Recent Labs  Lab 11/17/17 0946 11/17/17 1352  WBC 7.6 6.5  NEUTROABS 6.2  --   HGB 10.5* 9.2*  HCT 32.8* 28.9*  MCV 91.4 91.5  PLT 158 134*   Basic Metabolic Panel: Recent Labs  Lab 11/17/17 0946  NA 140  K 3.4*  CL 104  CO2 25  GLUCOSE 151*  BUN 21*  CREATININE 0.90  CALCIUM 8.4*   GFR: Estimated Creatinine Clearance: 28 mL/min (by C-G formula based on SCr of 0.9 mg/dL). Liver Function Tests: Recent Labs  Lab 11/17/17 0946  AST 19  ALT 9*  ALKPHOS 42  BILITOT 0.6  PROT 6.7  ALBUMIN 3.3*   No results for input(s): LIPASE, AMYLASE in the last 168 hours. No results for input(s): AMMONIA in the last 168 hours. Coagulation Profile: Recent Labs  Lab 11/17/17 0946  INR 1.10   Cardiac Enzymes: Recent Labs  Lab 11/17/17 0946  TROPONINI 0.03*   BNP (last 3 results) No results for input(s): PROBNP in the last 8760  hours. HbA1C: No results for input(s): HGBA1C in the last 72 hours. CBG: No results for input(s): GLUCAP in the last 168 hours. Lipid Profile: No results for input(s): CHOL, HDL, LDLCALC, TRIG, CHOLHDL, LDLDIRECT in the last 72 hours. Thyroid Function Tests: No results for input(s): TSH, T4TOTAL, FREET4, T3FREE, THYROIDAB in the last 72 hours. Anemia Panel: No results for input(s): VITAMINB12, FOLATE, FERRITIN, TIBC, IRON, RETICCTPCT in the last 72 hours. Urine analysis:    Component Value Date/Time   COLORURINE YELLOW 11/17/2017 0859   APPEARANCEUR HAZY (A) 11/17/2017 0859   LABSPEC 1.019 11/17/2017 0859   PHURINE 5.0 11/17/2017 0859   GLUCOSEU NEGATIVE 11/17/2017 0859   HGBUR NEGATIVE 11/17/2017 0859   BILIRUBINUR NEGATIVE 11/17/2017 0859   KETONESUR NEGATIVE 11/17/2017 0859   PROTEINUR 30  (A) 11/17/2017 0859   UROBILINOGEN 0.2 02/17/2015 1502   NITRITE NEGATIVE 11/17/2017 0859   LEUKOCYTESUR SMALL (A) 11/17/2017 0859   Sepsis Labs: @LABRCNTIP (procalcitonin:4,lacticidven:4) )No results found for this or any previous visit (from the past 240 hour(s)).   Radiological Exams on Admission: Dg Chest 2 View  Result Date: 11/17/2017 CLINICAL DATA:  Abdominal pain and rectal bleeding beginning today. EXAM: CHEST  2 VIEW COMPARISON:  09/05/2017 FINDINGS: Artifact overlies the chest. Mild cardiomegaly. Aortic atherosclerosis with calcification and unfolding. Abnormal density at the lung bases left more than right. Probable basilar pneumonia, left more than right. Small pleural effusions in the posterior costophrenic angles. IMPRESSION: Lower lobe pneumonia left worse than right. Aortic atherosclerosis. Electronically Signed   By: Paulina Fusi M.D.   On: 11/17/2017 11:30   Ct Abdomen Pelvis W Contrast  Result Date: 11/17/2017 CLINICAL DATA:  Recurrent rectal bleeding. EXAM: CT ABDOMEN AND PELVIS WITH CONTRAST TECHNIQUE: Multidetector CT imaging of the abdomen and pelvis was performed using the standard protocol following bolus administration of intravenous contrast. CONTRAST:  ISOVUE-300 IOPAMIDOL (ISOVUE-300) INJECTION 61% COMPARISON:  10/25/2015 FINDINGS: Lower chest: Cardiomegaly. Basilar pulmonary scarring. Chronically elevated left hemidiaphragm. Hepatobiliary: The liver parenchyma shows calcified granuloma in the right lobe. The liver is small, suggesting cirrhosis. There is moderate dilatation of the biliary ductal tree but no evidence of ductal stone or pancreatic head mass. Pancreas: Moderate atrophy.  No focal finding. Spleen: Normal.  Not enlarged. Adrenals/Urinary Tract: Adrenal glands are normal. Bilateral simple appearing renal cysts. No acute renal finding. Bladder appears normal. Stomach/Bowel: New large amount of gas and fecal matter throughout the colon. I do not see  evidence of a mass or of colitis. No abnormal small bowel finding by CT. Vascular/Lymphatic: Aortic atherosclerosis. No aneurysm. IVC is normal. No retroperitoneal adenopathy. Reproductive: Previous hysterectomy.  No pelvic mass. Other: No free fluid or air. Musculoskeletal: Advanced chronic degenerative changes of the spine. IMPRESSION: No acute finding by CT. Large amount of gas and fecal matter within the colon but no sign of mass or inflammatory disease. Probable cirrhosis. Chronic biliary ductal dilatation, slightly more prominent than on the previous exam. No sign of obstructing lesion however. Aortic atherosclerosis. Advanced degenerative disease of the spine. Electronically Signed   By: Paulina Fusi M.D.   On: 11/17/2017 12:56    EKG: Independently reviewed.  EKG has a very wavy baseline and is difficult to interpret, appears to be sinus arrhythmia.  Assessment/Plan Principal Problem:   Lower GI bleeding Active Problems:   Essential hypertension   Acute blood loss anemia   Diverticulosis   Rectal bleed   Elevated troponin   Hypokalemia    Rectal bleeding -In a patient with  known history of diverticulosis and a rectal ulcer. -Patient and family would like to treat conservatively if possible. -Discussed via phone with Dr. Karilyn Cotaehman. -Plan to observe for now, if loses enough blood to require transfusion then may need to consider colonoscopy versus sigmoidoscopy, however given her age we will try to observe only. -Has been typed and screened, no need for transfusion at present. -Monitor CBCs, transfuse if less than 7  Elevated troponin -Suspect demand ischemia due to rectal bleeding. -Patient denies chest pain, EKG without acute ischemic changes. -We will obtain 2D echo, continue to cycle troponins.  Hypokalemia -Replace orally.  Acute blood loss anemia -Due to ongoing rectal bleeding. -Transfuse if less than 7, monitor CBC, patient has been typed and  crossed.  Hypertension -Well-controlled.  Abnormal chest x-ray -Chest x-ray makes note of bilateral lower lobe pneumonia, however patient has no symptoms, has no cough, no leukocytosis, has had no fever. -We will elect to observe for now and treated without antibiotics. -She did receive a dose of Rocephin and azithromycin in the emerge.   DVT prophylaxis: SCDs Code Status: Full code Family Communication: Discussed with multiple family members at bedside including daughter Disposition Plan: Anticipate discharge home in 48 hours Consults called: GI Admission status: Observation   Time Spent: 85 minutes  Estela Philip AspenHernandez Acosta MD [facility] Pager [phone]  If 7PM-7AM, please contact night-coverage [email or web address] Password [email or web address]  11/17/2017, 5:58 PM

## 2017-11-17 NOTE — ED Triage Notes (Signed)
Rectal bleeding with Dark stools. History of same and was in ED for same one month ago.nausea but no vomiting.

## 2017-11-17 NOTE — ED Notes (Signed)
Pt in CT at this time.

## 2017-11-17 NOTE — ED Notes (Signed)
CT at bedside to pick up pt for scans. Pt assisted to bathroom by NT.

## 2017-11-17 NOTE — Progress Notes (Signed)
ANTIBIOTIC CONSULT NOTE - INITIAL  Pharmacy Consult for rocephin and azithromycin Indication: pneumonia  Allergies  Allergen Reactions  . Aspirin     Advised by MD not take due to bleed  . Penicillins Other (See Comments)    Has patient had a PCN reaction causing immediate rash, facial/tongue/throat swelling, SOB or lightheadedness with hypotension: No Has patient had a PCN reaction causing severe rash involving mucus membranes or skin necrosis: No Has patient had a PCN reaction that required hospitalization YES Has patient had a PCN reaction occurring within the last 10 years: No If all of the above answers are "NO", then may proceed with Cephalosporin use. "PASSES OUT"    Patient Measurements: Height: 5\' 4"  (162.6 cm) Weight: 128 lb (58.1 kg) IBW/kg (Calculated) : 54.7   Vital Signs: Temp: 97.4 F (36.3 C) (12/19 0827) Temp Source: Oral (12/19 0827) BP: 122/74 (12/19 1100) Pulse Rate: 96 (12/19 1215) Intake/Output from previous day: No intake/output data recorded. Intake/Output from this shift: No intake/output data recorded.  Labs: Recent Labs    11/17/17 0946  WBC 7.6  HGB 10.5*  PLT 158  CREATININE 0.90   Estimated Creatinine Clearance: 28 mL/min (by C-G formula based on SCr of 0.9 mg/dL). No results for input(s): VANCOTROUGH, VANCOPEAK, VANCORANDOM, GENTTROUGH, GENTPEAK, GENTRANDOM, TOBRATROUGH, TOBRAPEAK, TOBRARND, AMIKACINPEAK, AMIKACINTROU, AMIKACIN in the last 72 hours.   Microbiology: No results found for this or any previous visit (from the past 720 hour(s)).  Medical History: Past Medical History:  Diagnosis Date  . Arthritis   . Back pain   . Diverticulosis   . Headache   . Hypertension   . Lower GI bleeding     Medications:  See medication history Assessment: 83 yo lady to start antibiotics for CAP.  Initial doses ordered in the ED.  Goal of Therapy:  Eradication of infection  Plan:  Cont rocephin 1gm and azithromycin 500 mg IV q24  hours Pharmacy will sign off as no dose adjustment needed.  Seward Coran Poteet 11/17/2017,2:00 PM

## 2017-11-17 NOTE — ED Provider Notes (Signed)
[facility] EMERGENCY DEPARTMENT Provider Note   CSN: [id number] Arrival date & time: 11/17/17  7829     History   Chief Complaint Chief Complaint  Patient presents with  . Rectal Bleeding    HPI Lynn Nichols is a 83 y.o. female.  HPI Pt was seen at 0835. Per pt and her family, c/o gradual onset and persistence of constant "dark stools" for the past several weeks. Family states today pt passed "dark red blood" after having a small BM. Has been associated with generalized abd "pain." Denies N/V/D, no fevers, no back pain, no rash, no CP/SOB.   GI: Rehman Past Medical History:  Diagnosis Date  . Arthritis   . Back pain   . Diverticulosis   . Headache   . Hypertension     Patient Active Problem List   Diagnosis Date Noted  . Palliative care encounter   . Goals of care, counseling/discussion   . Encounter for hospice care discussion   . Lower GI bleed 04/22/2017  . Hypertension 04/22/2017  . Diverticulosis 04/22/2017  . Acute blood loss anemia 07/05/2014  . Acute lower gastrointestinal bleeding 06/30/2014  . Essential hypertension 06/30/2014  . Acute lower GI bleeding 06/30/2014    Past Surgical History:  Procedure Laterality Date  . ABDOMINAL HYSTERECTOMY    . CHOLECYSTECTOMY    . COLONOSCOPY N/A 07/01/2014   Procedure: COLONOSCOPY;  Surgeon: Malissa Hippo, MD;  Location: [facility];  Service: Endoscopy;  Laterality: N/A;  . COLONOSCOPY N/A 07/05/2014   Procedure: COLONOSCOPY;  Surgeon: Malissa Hippo, MD;  Location: [facility];  Service: Endoscopy;  Laterality: N/A;    OB History    Gravida Para Term Preterm AB Living   14 13 13   1 6    SAB TAB Ectopic Multiple Live Births   1               Home Medications    Prior to Admission medications   Medication Sig Start Date End Date Taking? Authorizing Provider  albuterol (PROAIR HFA) 108 (90 BASE) MCG/ACT inhaler Inhale 2 puffs into the lungs every 6 (six) hours as needed. For shortness of  breath    [provider]  Camphor-Eucalyptus-Menthol (VICKS VAPORUB EX) Apply 1 application topically daily as needed (pain).    [provider]  ferrous sulfate 325 (65 FE) MG EC tablet Take 1 tablet (325 mg total) by mouth 2 (two) times daily with a meal. Patient taking differently: Take 325 mg by mouth daily.  04/24/17   Emokpae, Courage, MD  latanoprost (XALATAN) 0.005 % ophthalmic solution Place 1 drop into both eyes at bedtime.  09/26/15   [provider]  lisinopril (PRINIVIL,ZESTRIL) 10 MG tablet Take 10 mg by mouth daily.     [provider]  NIFEdipine (PROCARDIA-XL/ADALAT CC) 30 MG 24 hr tablet Take 1 tablet by mouth daily. 08/28/17   [provider]  traMADol (ULTRAM) 50 MG tablet Take 50 mg by mouth 3 (three) times daily as needed for moderate pain.     [provider]    Family History Family History  Problem Relation Age of Onset  . Hypertension Mother   . Hypertension Father   . Diabetes Other     Social History Social History   Tobacco Use  . Smoking status: Never Smoker  . Smokeless tobacco: Current User    Types: Snuff  Substance Use Topics  . Alcohol use: No    Alcohol/week: 0.0  oz  . Drug use: No     Allergies   Aspirin and Penicillins   Review of Systems Review of Systems ROS: Statement: All systems negative except as marked or noted in the HPI; Constitutional: Negative for fever and chills. ; ; Eyes: Negative for eye pain, redness and discharge. ; ; ENMT: Negative for ear pain, hoarseness, nasal congestion, sinus pressure and sore throat. ; ; Cardiovascular: Negative for chest pain, palpitations, diaphoresis, dyspnea and peripheral edema. ; ; Respiratory: Negative for cough, wheezing and stridor. ; ; Gastrointestinal: Negative for nausea, vomiting, diarrhea, hematemesis, jaundice and +abd pain, rectal bleeding. . ; ; Genitourinary: Negative for dysuria, flank pain and hematuria. ; ; Musculoskeletal:  Negative for back pain and neck pain. Negative for swelling and trauma.; ; Skin: Negative for pruritus, rash, abrasions, blisters, bruising and skin lesion.; ; Neuro: Negative for headache, lightheadedness and neck stiffness. Negative for weakness, altered level of consciousness, altered mental status, extremity weakness, paresthesias, involuntary movement, seizure and syncope.       Physical Exam Updated Vital Signs BP (!) 180/80 (BP Location: Right Arm)   Pulse 97   Temp (!) 97.4 F (36.3 C) (Oral)   Resp 16   Ht 5\' 4"  (1.626 m)   Wt 58.1 kg (128 lb)   SpO2 98%   BMI 21.97 kg/m    BP 112/61   Pulse 80   Temp 98 F (36.7 C) (Oral)   Resp (!) 22   Ht 5\' 4"  (1.626 m)   Wt 58.1 kg (128 lb)   SpO2 100%   BMI 21.97 kg/m     09:00:51 Orthostatic Vital Signs AL  Orthostatic Lying   BP- Lying: 133/85  Pulse- Lying: 103      Orthostatic Sitting  BP- Sitting: 122/88  Pulse- Sitting: 106      Orthostatic Standing at 0 minutes  BP- Standing at 0 minutes: 130/82  Pulse- Standing at 0 minutes: 116      Physical Exam 0840: Physical examination:  Nursing notes reviewed; Vital signs and O2 SAT reviewed;  Constitutional: Well developed, Well nourished, Well hydrated, In no acute distress; Head:  Normocephalic, atraumatic; Eyes: EOMI, PERRL, No scleral icterus; ENMT: Mouth and pharynx normal, Mucous membranes moist; Neck: Supple, Full range of motion, No lymphadenopathy; Cardiovascular: Regular rate and rhythm, No gallop; Respiratory: Breath sounds clear & equal bilaterally, No wheezes.  Speaking full sentences with ease, Normal respiratory effort/excursion; Chest: Nontender, Movement normal; Abdomen: Soft, +mild diffuse tenderness to palp. No rebound or guarding. Nondistended, Normal bowel sounds. Rectal exam performed w/permission of pt and ED RN chaperone present.  Anal tone normal.  Non-tender, maroon blood in rectal vault, heme positive.  No fissures, no external hemorrhoids, no  palp masses.;; Genitourinary: No CVA tenderness; Extremities: Pulses normal, No tenderness, No edema, No calf edema or asymmetry.; Neuro: AA&Ox3, Major CN grossly intact.  Speech clear. No gross focal motor or sensory deficits in extremities.; Skin: Color normal, Warm, Dry.   ED Treatments / Results  Labs (all labs ordered are listed, but only abnormal results are displayed)   EKG  EKG Interpretation  Date/Time:  Wednesday November 17 2017 08:38:23 EST Ventricular Rate:  111 PR Interval:    QRS Duration: 95 QT Interval:  358 QTC Calculation: 487 R Axis:   -27 Text Interpretation:  Atrial fibrillation Borderline left axis deviation Abnormal R-wave progression, early transition Borderline T abnormalities, anterior leads Borderline prolonged QT interval Baseline wander When compared with ECG of 09/05/2017 Rate faster  Confirmed by Samuel JesterMcManus, [place] [phone]) on 11/17/2017 9:52:08 AM       Radiology   Procedures Procedures (including critical care time)  Medications Ordered in ED Medications - No data to display   Initial Impression / Assessment and Plan / ED Course  I have reviewed the triage vital signs and the nursing notes.  Pertinent labs & imaging results that were available during my care of the patient were reviewed by me and considered in my medical decision making (see chart for details).  MDM Reviewed: previous chart, nursing note and vitals Reviewed previous: labs and ECG Interpretation: labs, x-ray, CT scan and ECG   Results for orders placed or performed during the hospital encounter of 11/17/17  Comprehensive metabolic panel  Result Value Ref Range   Sodium 140 135 - 145 mmol/L   Potassium 3.4 (L) 3.5 - 5.1 mmol/L   Chloride 104 101 - 111 mmol/L   CO2 25 22 - 32 mmol/L   Glucose, Bld 151 (H) 65 - 99 mg/dL   BUN 21 (H) 6 - 20 mg/dL   Creatinine, Ser 6.040.90 0.44 - 1.00 mg/dL   Calcium 8.4 (L) 8.9 - 10.3 mg/dL   Total Protein 6.7 6.5 - 8.1 g/dL   Albumin 3.3  (L) 3.5 - 5.0 g/dL   AST 19 15 - 41 U/L   ALT 9 (L) 14 - 54 U/L   Alkaline Phosphatase 42 38 - 126 U/L   Total Bilirubin 0.6 0.3 - 1.2 mg/dL   GFR calc non Af Amer 50 (L) >60 mL/min   GFR calc Af Amer 58 (L) >60 mL/min   Anion gap 11 5 - 15  CBC with Differential  Result Value Ref Range   WBC 7.6 4.0 - 10.5 K/uL   RBC 3.59 (L) 3.87 - 5.11 MIL/uL   Hemoglobin 10.5 (L) 12.0 - 15.0 g/dL   HCT 54.032.8 (L) 98.136.0 - 19.146.0 %   MCV 91.4 78.0 - 100.0 fL   MCH 29.2 26.0 - 34.0 pg   MCHC 32.0 30.0 - 36.0 g/dL   RDW 47.813.7 29.511.5 - 62.115.5 %   Platelets 158 150 - 400 K/uL   Neutrophils Relative % 82 %   Neutro Abs 6.2 1.7 - 7.7 K/uL   Lymphocytes Relative 11 %   Lymphs Abs 0.8 0.7 - 4.0 K/uL   Monocytes Relative 6 %   Monocytes Absolute 0.5 0.1 - 1.0 K/uL   Eosinophils Relative 1 %   Eosinophils Absolute 0.1 0.0 - 0.7 K/uL   Basophils Relative 0 %   Basophils Absolute 0.0 0.0 - 0.1 K/uL  Lactic acid, plasma  Result Value Ref Range   Lactic Acid, Venous 1.5 0.5 - 1.9 mmol/L  Lactic acid, plasma  Result Value Ref Range   Lactic Acid, Venous 1.4 0.5 - 1.9 mmol/L  Protime-INR  Result Value Ref Range   Prothrombin Time 14.1 11.4 - 15.2 seconds   INR 1.10   Urinalysis, Routine w reflex microscopic  Result Value Ref Range   Color, Urine YELLOW YELLOW   APPearance HAZY (A) CLEAR   Specific Gravity, Urine 1.019 1.005 - 1.030   pH 5.0 5.0 - 8.0   Glucose, UA NEGATIVE NEGATIVE mg/dL   Hgb urine dipstick NEGATIVE NEGATIVE   Bilirubin Urine NEGATIVE NEGATIVE   Ketones, ur NEGATIVE NEGATIVE mg/dL   Protein, ur 30 (A) NEGATIVE mg/dL   Nitrite NEGATIVE NEGATIVE   Leukocytes, UA SMALL (A) NEGATIVE   RBC / HPF 6-30 0 - 5 RBC/hpf  WBC, UA 6-30 0 - 5 WBC/hpf   Bacteria, UA NONE SEEN NONE SEEN   Squamous Epithelial / LPF 6-30 (A) NONE SEEN   Mucus PRESENT    Hyaline Casts, UA PRESENT    Uric Acid Crys, UA PRESENT   Troponin I  Result Value Ref Range   Troponin I 0.03 (HH) <0.03 ng/mL  CBC  Result  Value Ref Range   WBC 6.5 4.0 - 10.5 K/uL   RBC 3.16 (L) 3.87 - 5.11 MIL/uL   Hemoglobin 9.2 (L) 12.0 - 15.0 g/dL   HCT 16.128.9 (L) 09.636.0 - 04.546.0 %   MCV 91.5 78.0 - 100.0 fL   MCH 29.1 26.0 - 34.0 pg   MCHC 31.8 30.0 - 36.0 g/dL   RDW 40.913.9 81.111.5 - 91.415.5 %   Platelets 134 (L) 150 - 400 K/uL  POC occult blood, ED  Result Value Ref Range   Fecal Occult Bld POSITIVE (A) NEGATIVE  Sample to Blood Bank  Result Value Ref Range   Blood Bank Specimen SAMPLE AVAILABLE FOR TESTING    Sample Expiration 11/20/2017    Dg Chest 2 View Result Date: 11/17/2017 CLINICAL DATA:  Abdominal pain and rectal bleeding beginning today. EXAM: CHEST  2 VIEW COMPARISON:  09/05/2017 FINDINGS: Artifact overlies the chest. Mild cardiomegaly. Aortic atherosclerosis with calcification and unfolding. Abnormal density at the lung bases left more than right. Probable basilar pneumonia, left more than right. Small pleural effusions in the posterior costophrenic angles. IMPRESSION: Lower lobe pneumonia left worse than right. Aortic atherosclerosis. Electronically Signed   By: Paulina FusiMark  Shogry M.D.   On: 11/17/2017 11:30   Ct Abdomen Pelvis W Contrast Result Date: 11/17/2017 CLINICAL DATA:  Recurrent rectal bleeding. EXAM: CT ABDOMEN AND PELVIS WITH CONTRAST TECHNIQUE: Multidetector CT imaging of the abdomen and pelvis was performed using the standard protocol following bolus administration of intravenous contrast. CONTRAST:  100mL ISOVUE-300 IOPAMIDOL (ISOVUE-300) INJECTION 61% COMPARISON:  10/25/2015 FINDINGS: Lower chest: Cardiomegaly. Basilar pulmonary scarring. Chronically elevated left hemidiaphragm. Hepatobiliary: The liver parenchyma shows calcified granuloma in the right lobe. The liver is small, suggesting cirrhosis. There is moderate dilatation of the biliary ductal tree but no evidence of ductal stone or pancreatic head mass. Pancreas: Moderate atrophy.  No focal finding. Spleen: Normal.  Not enlarged. Adrenals/Urinary Tract:  Adrenal glands are normal. Bilateral simple appearing renal cysts. No acute renal finding. Bladder appears normal. Stomach/Bowel: New large amount of gas and fecal matter throughout the colon. I do not see evidence of a mass or of colitis. No abnormal small bowel finding by CT. Vascular/Lymphatic: Aortic atherosclerosis. No aneurysm. IVC is normal. No retroperitoneal adenopathy. Reproductive: Previous hysterectomy.  No pelvic mass. Other: No free fluid or air. Musculoskeletal: Advanced chronic degenerative changes of the spine. IMPRESSION: No acute finding by CT. Large amount of gas and fecal matter within the colon but no sign of mass or inflammatory disease. Probable cirrhosis. Chronic biliary ductal dilatation, slightly more prominent than on the previous exam. No sign of obstructing lesion however. Aortic atherosclerosis. Advanced degenerative disease of the spine. Electronically Signed   By: Paulina FusiMark  Shogry M.D.   On: 11/17/2017 12:56    Results for Donnetta HailHENDERSON, [place] H (MRN [id number]) as of 11/17/2017 11:14  Ref. Range 02/17/2015 15:26 10/25/2015 19:07 03/11/2017 12:27 09/05/2017 17:09 11/17/2017 09:46  Troponin I Latest Ref Range: <0.03 ng/mL <0.03 <0.03 <0.03 <0.03 0.03 [facility])    Results for Donnetta HailHENDERSON, Daneille H (MRN [id number]) as of 11/17/2017 11:14  Ref. Range 04/26/2017 15:23 04/27/2017  05:15 09/05/2017 17:09 10/19/2017 14:15 11/17/2017 09:46  Hemoglobin Latest Ref Range: 12.0 - 15.0 g/dL 56.2 (L) 13.0 (L) 86.5 (L) 13.0 10.5 (L)  HCT Latest Ref Range: 36.0 - 46.0 % 30.8 (L) 35.8 (L) 35.8 (L) 41.3 32.8 (L)    1400:  Pt has had large BM in ED with dark red blood in stool; will repeat H/H and T&S. IV abx ordered for pneumonia on CXR. Will need admit. Dx and testing d/w pt and family.  Questions answered.  Verb understanding, agreeable to admit.  T/C to GI Dr. Karilyn Cota, case discussed, including:  HPI, pertinent PM/SHx, VS/PE, dx testing, ED course and treatment:  Agreeable to consult.   1425:  T/C to [facility]  Dr. Ardyth Harps, case discussed, including:  HPI, pertinent PM/SHx, VS/PE, dx testing, ED course and treatment:  Agreeable to admit, requests to write temporary orders, obtain medical bed.      Final Clinical Impressions(s) / ED Diagnoses   Final diagnoses:  None    ED Discharge Orders    None       Samuel Jester, DO 11/20/17 [phone]

## 2017-11-17 NOTE — ED Notes (Signed)
CRITICAL VALUE ALERT  Critical Value:  Troponin 0.03  Date & Time Notied:  11/17/17 1105  Provider Notified: Notified MD   Orders Received/Actions taken: Notified MD

## 2017-11-17 NOTE — ED Notes (Addendum)
Pt back in room from CT.   Prior to CT pt had large bowel movement with dark red blood stool noted at this time.EDP aware.

## 2017-11-17 NOTE — Progress Notes (Signed)
Patient had another large bloody BM after arriving to the floor.

## 2017-11-17 NOTE — ED Notes (Signed)
Pt refusing CT. EDP consulted. Pt now agrees to have scan with family member accompanyning pt to CT. CT staff aware and reported would be to get patient.

## 2017-11-18 ENCOUNTER — Observation Stay (HOSPITAL_BASED_OUTPATIENT_CLINIC_OR_DEPARTMENT_OTHER): Payer: Medicare Other

## 2017-11-18 DIAGNOSIS — K5751 Diverticulosis of both small and large intestine without perforation or abscess with bleeding: Secondary | ICD-10-CM

## 2017-11-18 DIAGNOSIS — I351 Nonrheumatic aortic (valve) insufficiency: Secondary | ICD-10-CM

## 2017-11-18 DIAGNOSIS — R748 Abnormal levels of other serum enzymes: Secondary | ICD-10-CM | POA: Diagnosis not present

## 2017-11-18 DIAGNOSIS — D62 Acute posthemorrhagic anemia: Secondary | ICD-10-CM | POA: Diagnosis not present

## 2017-11-18 DIAGNOSIS — K922 Gastrointestinal hemorrhage, unspecified: Secondary | ICD-10-CM | POA: Diagnosis not present

## 2017-11-18 DIAGNOSIS — K625 Hemorrhage of anus and rectum: Secondary | ICD-10-CM | POA: Diagnosis not present

## 2017-11-18 DIAGNOSIS — J189 Pneumonia, unspecified organism: Secondary | ICD-10-CM | POA: Diagnosis not present

## 2017-11-18 LAB — CBC
HEMATOCRIT: 26.1 % — AB (ref 36.0–46.0)
HEMOGLOBIN: 8.4 g/dL — AB (ref 12.0–15.0)
MCH: 29.4 pg (ref 26.0–34.0)
MCHC: 32.2 g/dL (ref 30.0–36.0)
MCV: 91.3 fL (ref 78.0–100.0)
Platelets: 129 10*3/uL — ABNORMAL LOW (ref 150–400)
RBC: 2.86 MIL/uL — AB (ref 3.87–5.11)
RDW: 14.1 % (ref 11.5–15.5)
WBC: 4.7 10*3/uL (ref 4.0–10.5)

## 2017-11-18 LAB — BASIC METABOLIC PANEL
ANION GAP: 9 (ref 5–15)
BUN: 23 mg/dL — AB (ref 6–20)
CHLORIDE: 112 mmol/L — AB (ref 101–111)
CO2: 21 mmol/L — ABNORMAL LOW (ref 22–32)
Calcium: 8.1 mg/dL — ABNORMAL LOW (ref 8.9–10.3)
Creatinine, Ser: 0.91 mg/dL (ref 0.44–1.00)
GFR calc Af Amer: 58 mL/min — ABNORMAL LOW (ref >60)
GFR calc non Af Amer: 50 mL/min — ABNORMAL LOW (ref >60)
Glucose, Bld: 91 mg/dL (ref 65–99)
POTASSIUM: 4 mmol/L (ref 3.5–5.1)
SODIUM: 142 mmol/L (ref 135–145)

## 2017-11-18 LAB — TROPONIN I
TROPONIN I: 0.03 ng/mL — AB (ref <0.03)
TROPONIN I: 0.03 ng/mL — AB (ref <0.03)

## 2017-11-18 MED ORDER — SENNOSIDES-DOCUSATE SODIUM 8.6-50 MG PO TABS
1.0000 | ORAL_TABLET | Freq: Two times a day (BID) | ORAL | Status: DC
  Administered 2017-11-18 – 2017-11-19 (×3): 1 via ORAL
  Filled 2017-11-18 (×3): qty 1

## 2017-11-18 NOTE — Care Management Obs Status (Signed)
MEDICARE OBSERVATION STATUS NOTIFICATION   Patient Details  Name: Lynn Nichols MRN: [id number] Date of Birth: 04/19/1914   Medicare Observation Status Notification Given:  Yes    Kiyana Vazguez, Chrystine OilerSharley Diane, RN 11/18/2017, 10:01 AM

## 2017-11-18 NOTE — Progress Notes (Signed)
PROGRESS NOTE    Lynn Nichols  ZOX:[id number] DOB: 06-01-1914 DOA: 11/17/2017 PCP: [facility]     Brief Narrative:  83 year old woman admitted from home on 12/19 due to rectal bleeding.  She has a known history of pandiverticulosis and a rectal ulcer that intermittently bleeds causing her to seek hospital care.  She last had colonoscopy in 2015.  During her last hospitalization in May patient and family did not want invasive procedures unless absolutely needed.  Her hemoglobin 1 month ago was 13 and is down to 8.4 this morning, down from 10 on admission.  Admission was requested.   Assessment & Plan:   Principal Problem:   Lower GI bleeding Active Problems:   Essential hypertension   Acute blood loss anemia   Diverticulosis   Rectal bleed   Elevated troponin   Hypokalemia    Rectal bleeding -In a patient with a known history of diverticulosis and a rectal ulcer. -Patient and family would like to treat conservatively if possible. -GI has been consulted and recommendations are pending. -Her hemoglobin is down to 8.7, she has had no further bleeding episodes overnight. -No plans for transfusion at present, continue to follow hemoglobin at least 1 more night.  Elevated troponin -Suspect demand ischemia due to rectal bleeding. -Patient denies chest pain, EKG without acute ischemic changes. -2D echo has been requested, as long as EF normal no plans for further cardiac workup.  Acute blood loss anemia -Due to ongoing rectal bleeding. -Transfuse if less than 7, monitor CBC, patient has been typed and crossed.  Hypertension -Fair control, do not anticipate medication changes while in the hospital.  Abnormal chest x-ray -Chest x-ray makes note of bilateral lower lobe pneumonia, however patient has no symptoms specifically no cough, leukocytosis and no fever. -I have elected to observe for now without antibiotic therapy.   DVT prophylaxis:  SCDs Code Status: DNR Family Communication: Daughter at bedside updated on plan of care and questions answered Disposition Plan: Observe at least another 24 hours, await GI recommendations  Consultants:   Gastroenterology  Procedures:   2D echo  Antimicrobials:  Anti-infectives (From admission, onward)   Start     Dose/Rate Route Frequency Ordered Stop   11/18/17 1400  azithromycin (ZITHROMAX) 500 mg in dextrose 5 % 250 mL IVPB  Status:  Discontinued     500 mg 250 mL/hr over 60 Minutes Intravenous Every 24 hours 11/17/17 1403 11/17/17 1754   11/18/17 1400  cefTRIAXone (ROCEPHIN) 1 g in dextrose 5 % 50 mL IVPB  Status:  Discontinued     1 g 100 mL/hr over 30 Minutes Intravenous Every 24 hours 11/17/17 1403 11/17/17 1754   11/17/17 1400  cefTRIAXone (ROCEPHIN) 1 g in dextrose 5 % 50 mL IVPB     1 g 100 mL/hr over 30 Minutes Intravenous  Once 11/17/17 1348 11/17/17 1454   11/17/17 1400  azithromycin (ZITHROMAX) 500 mg in dextrose 5 % 250 mL IVPB     500 mg 250 mL/hr over 60 Minutes Intravenous  Once 11/17/17 1348 11/17/17 1555       Subjective: Feels well, specifically no abdominal pain, no nausea, no vomiting.  Has not had any further rectal bleeding overnight.  Objective: Vitals:   11/17/17 2043 11/17/17 2143 11/18/17 0627 11/18/17 0729  BP:  (!) 147/49 (!) 146/66   Pulse:  67 69   Resp:  16 16   Temp:  98.2 F (36.8 C) 98.4 F (36.9 C)  TempSrc:  Oral Oral   SpO2: 94% 100% 98% 96%  Weight:   58.1 kg (127 lb 16 oz)   Height:        Intake/Output Summary (Last 24 hours) at 11/18/2017 1433 Last data filed at 11/18/2017 0500 Gross per 24 hour  Intake 911.25 ml  Output 300 ml  Net 611.25 ml   Filed Weights   11/17/17 0824 11/18/17 0627  Weight: 58.1 kg (128 lb) 58.1 kg (127 lb 16 oz)    Examination:  General exam: Alert, awake, oriented x 3 Respiratory system: Clear to auscultation. Respiratory effort normal. Cardiovascular system:RRR. No murmurs, rubs,  gallops. Gastrointestinal system: Abdomen is nondistended, soft and nontender. No organomegaly or masses felt. Normal bowel sounds heard. Central nervous system: Alert and oriented. No focal neurological deficits. Extremities: No C/C/E, +pedal pulses Skin: No rashes, lesions or ulcers Psychiatry: Judgement and insight appear normal. Mood & affect appropriate.     Data Reviewed: I have personally reviewed following labs and imaging studies  CBC: Recent Labs  Lab 11/17/17 0946 11/17/17 1352 11/18/17 0232  WBC 7.6 6.5 4.7  NEUTROABS 6.2  --   --   HGB 10.5* 9.2* 8.4*  HCT 32.8* 28.9* 26.1*  MCV 91.4 91.5 91.3  PLT 158 134* 129*   Basic Metabolic Panel: Recent Labs  Lab 11/17/17 0946 11/18/17 0232  NA 140 142  K 3.4* 4.0  CL 104 112*  CO2 25 21*  GLUCOSE 151* 91  BUN 21* 23*  CREATININE 0.90 0.91  CALCIUM 8.4* 8.1*   GFR: Estimated Creatinine Clearance: 27.7 mL/min (by C-G formula based on SCr of 0.91 mg/dL). Liver Function Tests: Recent Labs  Lab 11/17/17 0946  AST 19  ALT 9*  ALKPHOS 42  BILITOT 0.6  PROT 6.7  ALBUMIN 3.3*   No results for input(s): LIPASE, AMYLASE in the last 168 hours. No results for input(s): AMMONIA in the last 168 hours. Coagulation Profile: Recent Labs  Lab 11/17/17 0946  INR 1.10   Cardiac Enzymes: Recent Labs  Lab 11/17/17 0946 11/17/17 2024 11/18/17 0232 11/18/17 0837  TROPONINI 0.03* 0.03* 0.03* 0.03*   BNP (last 3 results) No results for input(s): PROBNP in the last 8760 hours. HbA1C: No results for input(s): HGBA1C in the last 72 hours. CBG: No results for input(s): GLUCAP in the last 168 hours. Lipid Profile: No results for input(s): CHOL, HDL, LDLCALC, TRIG, CHOLHDL, LDLDIRECT in the last 72 hours. Thyroid Function Tests: No results for input(s): TSH, T4TOTAL, FREET4, T3FREE, THYROIDAB in the last 72 hours. Anemia Panel: No results for input(s): VITAMINB12, FOLATE, FERRITIN, TIBC, IRON, RETICCTPCT in the last  72 hours. Urine analysis:    Component Value Date/Time   COLORURINE YELLOW 11/17/2017 0859   APPEARANCEUR HAZY (A) 11/17/2017 0859   LABSPEC 1.019 11/17/2017 0859   PHURINE 5.0 11/17/2017 0859   GLUCOSEU NEGATIVE 11/17/2017 0859   HGBUR NEGATIVE 11/17/2017 0859   BILIRUBINUR NEGATIVE 11/17/2017 0859   KETONESUR NEGATIVE 11/17/2017 0859   PROTEINUR 30 (A) 11/17/2017 0859   UROBILINOGEN 0.2 02/17/2015 1502   NITRITE NEGATIVE 11/17/2017 0859   LEUKOCYTESUR SMALL (A) 11/17/2017 0859   Sepsis Labs: @LABRCNTIP (procalcitonin:4,lacticidven:4)  )No results found for this or any previous visit (from the past 240 hour(s)).       Radiology Studies: Dg Chest 2 View  Result Date: 11/17/2017 CLINICAL DATA:  Abdominal pain and rectal bleeding beginning today. EXAM: CHEST  2 VIEW COMPARISON:  09/05/2017 FINDINGS: Artifact overlies the chest. Mild cardiomegaly. Aortic atherosclerosis  with calcification and unfolding. Abnormal density at the lung bases left more than right. Probable basilar pneumonia, left more than right. Small pleural effusions in the posterior costophrenic angles. IMPRESSION: Lower lobe pneumonia left worse than right. Aortic atherosclerosis. Electronically Signed   By: Paulina FusiMark  Shogry M.D.   On: 11/17/2017 11:30   Ct Abdomen Pelvis W Contrast  Result Date: 11/17/2017 CLINICAL DATA:  Recurrent rectal bleeding. EXAM: CT ABDOMEN AND PELVIS WITH CONTRAST TECHNIQUE: Multidetector CT imaging of the abdomen and pelvis was performed using the standard protocol following bolus administration of intravenous contrast. CONTRAST:  100mL ISOVUE-300 IOPAMIDOL (ISOVUE-300) INJECTION 61% COMPARISON:  10/25/2015 FINDINGS: Lower chest: Cardiomegaly. Basilar pulmonary scarring. Chronically elevated left hemidiaphragm. Hepatobiliary: The liver parenchyma shows calcified granuloma in the right lobe. The liver is small, suggesting cirrhosis. There is moderate dilatation of the biliary ductal tree but no  evidence of ductal stone or pancreatic head mass. Pancreas: Moderate atrophy.  No focal finding. Spleen: Normal.  Not enlarged. Adrenals/Urinary Tract: Adrenal glands are normal. Bilateral simple appearing renal cysts. No acute renal finding. Bladder appears normal. Stomach/Bowel: New large amount of gas and fecal matter throughout the colon. I do not see evidence of a mass or of colitis. No abnormal small bowel finding by CT. Vascular/Lymphatic: Aortic atherosclerosis. No aneurysm. IVC is normal. No retroperitoneal adenopathy. Reproductive: Previous hysterectomy.  No pelvic mass. Other: No free fluid or air. Musculoskeletal: Advanced chronic degenerative changes of the spine. IMPRESSION: No acute finding by CT. Large amount of gas and fecal matter within the colon but no sign of mass or inflammatory disease. Probable cirrhosis. Chronic biliary ductal dilatation, slightly more prominent than on the previous exam. No sign of obstructing lesion however. Aortic atherosclerosis. Advanced degenerative disease of the spine. Electronically Signed   By: Paulina FusiMark  Shogry M.D.   On: 11/17/2017 12:56        Scheduled Meds: . ferrous sulfate  325 mg Oral BID WC  . latanoprost  1 drop Both Eyes QHS  . NIFEdipine  30 mg Oral Daily   Continuous Infusions: . sodium chloride 75 mL/hr at 11/18/17 1033     LOS: 1 day    Time spent: 25 minutes. Greater than 50% of this time was spent in direct contact with the patient coordinating care.     Chaya JanEstela Hernandez Acosta, MD [facility] Pager [phone]  If 7PM-7AM, please contact night-coverage [email or web address] Password [email or web address] 11/18/2017, 2:33 PM

## 2017-11-18 NOTE — Care Management Note (Signed)
Case Management Note  Patient Details  Name: Donnetta Hailnnie H Chicoine MRN: [id number] Date of Birth: 02/04/1914  Subjective/Objective:  Adm with GIB. From home with daughters. She is still ind with ADL's. Walks with a cane. Has WC, RW also at home. No Home health pta. [facility]. Patient was discharged in May with [facility] to talk with patient at home. Per patient and family at bedside, she never spoke with Hospice.                   Action/Plan: Watching H&H. Anticipate DC home with family.   Expected Discharge Date:    11/19/2017              Expected Discharge Plan:  Home/Self Care  In-House Referral:     Discharge planning Services  CM Consult  Post Acute Care Choice:  NA Choice offered to:  NA  DME Arranged:    DME Agency:     HH Arranged:    HH Agency:     Status of Service:  In process, will continue to follow  If discussed at Long Length of Stay Meetings, dates discussed:    Additional Comments:  Christiona Siddique, Chrystine OilerSharley Diane, RN 11/18/2017, 10:37 AM

## 2017-11-18 NOTE — Care Management CC44 (Signed)
Condition Code 44 Documentation Completed  Patient Details  Name: Lynn Nichols MRN: [id number] Date of Birth: [birth year]   Condition Code 44 given:  Yes Patient signature on Condition Code 44 notice:  Yes Documentation of 2 MD's agreement:  Yes Code 44 added to claim:  Yes    Lynn Nichols, Chrystine OilerSharley Diane, RN 11/18/2017, 10:01 AM

## 2017-11-18 NOTE — Consult Note (Signed)
Reason for Consult: LGIB Referring Physician: Hospitalist  Lynn Nichols is an 83 y.o. female.  HPI: Admitted thru the ED yesterday with c/o passing multiple dark red stools. Has been passing dark stools for about 2 months per family. Has been evaluated in the ED several times recently for same. Has had 3 stools with dark red blood since admission per RN. She denies any abdominal pain. She is hungry.  No NSAIDS. Has had a drop in her hemobloin from 10.5 to 8.4. In 2015 underwent a colonoscopy by Dr. Laural Golden which revealed Scattered diverticula throughout the colon none with stigmata of bleed. Elliptical ulcer noted at the rectosigmoid junction about 25 mm in length and 10 mm in maximal width. Biopsy taken for routine histology. Normal rectal mucosa and anorectal junction.   In August of 2015 she underwent a bleeding scan which revealed : IMPRESSION: Subtle findings of activity in the pelvis suggestive of rectosigmoid low level bleeding.   Past Medical History:  Diagnosis Date  . Arthritis   . Back pain   . Diverticulosis   . Headache   . Hypertension   . Lower GI bleeding     Past Surgical History:  Procedure Laterality Date  . ABDOMINAL HYSTERECTOMY    . CHOLECYSTECTOMY    . COLONOSCOPY N/A 07/01/2014   Procedure: COLONOSCOPY;  Surgeon: Rogene Houston, MD;  Location: [facility];  Service: Endoscopy;  Laterality: N/A;  . COLONOSCOPY N/A 07/05/2014   Procedure: COLONOSCOPY;  Surgeon: Rogene Houston, MD;  Location: [facility];  Service: Endoscopy;  Laterality: N/A;    Family History  Problem Relation Age of Onset  . Hypertension Mother   . Hypertension Father   . Diabetes Other     Social History:  reports that  has never smoked. Her smokeless tobacco use includes snuff. She reports that she does not drink alcohol or use drugs.  Allergies:  Allergies  Allergen Reactions  . Aspirin     Advised by MD not take due to bleed  . Penicillins Other (See Comments)     Has patient had a PCN reaction causing immediate rash, facial/tongue/throat swelling, SOB or lightheadedness with hypotension: No Has patient had a PCN reaction causing severe rash involving mucus membranes or skin necrosis: No Has patient had a PCN reaction that required hospitalization YES Has patient had a PCN reaction occurring within the last 10 years: No If all of the above answers are "NO", then may proceed with Cephalosporin use. "PASSES OUT"    Medications: I have reviewed the patient's current medications.  Results for orders placed or performed during the hospital encounter of 11/17/17 (from the past 48 hour(s))  POC occult blood, ED     Status: Abnormal   Collection Time: 11/17/17  8:45 AM  Result Value Ref Range   Fecal Occult Bld POSITIVE (A) NEGATIVE  Urinalysis, Routine w reflex microscopic     Status: Abnormal   Collection Time: 11/17/17  8:59 AM  Result Value Ref Range   Color, Urine YELLOW YELLOW   APPearance HAZY (A) CLEAR   Specific Gravity, Urine 1.019 1.005 - 1.030   pH 5.0 5.0 - 8.0   Glucose, UA NEGATIVE NEGATIVE mg/dL   Hgb urine dipstick NEGATIVE NEGATIVE   Bilirubin Urine NEGATIVE NEGATIVE   Ketones, ur NEGATIVE NEGATIVE mg/dL   Protein, ur 30 (A) NEGATIVE mg/dL   Nitrite NEGATIVE NEGATIVE   Leukocytes, UA SMALL (A) NEGATIVE   RBC / HPF 6-30 0 - 5  RBC/hpf   WBC, UA 6-30 0 - 5 WBC/hpf   Bacteria, UA NONE SEEN NONE SEEN   Squamous Epithelial / LPF 6-30 (A) NONE SEEN   Mucus PRESENT    Hyaline Casts, UA PRESENT    Uric Acid Crys, UA PRESENT   Comprehensive metabolic panel     Status: Abnormal   Collection Time: 11/17/17  9:46 AM  Result Value Ref Range   Sodium 140 135 - 145 mmol/L   Potassium 3.4 (L) 3.5 - 5.1 mmol/L   Chloride 104 101 - 111 mmol/L   CO2 25 22 - 32 mmol/L   Glucose, Bld 151 (H) 65 - 99 mg/dL   BUN 21 (H) 6 - 20 mg/dL   Creatinine, Ser 0.90 0.44 - 1.00 mg/dL   Calcium 8.4 (L) 8.9 - 10.3 mg/dL   Total Protein 6.7 6.5 - 8.1 g/dL    Albumin 3.3 (L) 3.5 - 5.0 g/dL   AST 19 15 - 41 U/L   ALT 9 (L) 14 - 54 U/L   Alkaline Phosphatase 42 38 - 126 U/L   Total Bilirubin 0.6 0.3 - 1.2 mg/dL   GFR calc non Af Amer 50 (L) >60 mL/min   GFR calc Af Amer 58 (L) >60 mL/min    Comment: (NOTE) The eGFR has been calculated using the CKD EPI equation. This calculation has not been validated in all clinical situations. eGFR's persistently <60 mL/min signify possible Chronic Kidney Disease.    Anion gap 11 5 - 15  CBC with Differential     Status: Abnormal   Collection Time: 11/17/17  9:46 AM  Result Value Ref Range   WBC 7.6 4.0 - 10.5 K/uL   RBC 3.59 (L) 3.87 - 5.11 MIL/uL   Hemoglobin 10.5 (L) 12.0 - 15.0 g/dL   HCT 32.8 (L) 36.0 - 46.0 %   MCV 91.4 78.0 - 100.0 fL   MCH 29.2 26.0 - 34.0 pg   MCHC 32.0 30.0 - 36.0 g/dL   RDW 13.7 11.5 - 15.5 %   Platelets 158 150 - 400 K/uL   Neutrophils Relative % 82 %   Neutro Abs 6.2 1.7 - 7.7 K/uL   Lymphocytes Relative 11 %   Lymphs Abs 0.8 0.7 - 4.0 K/uL   Monocytes Relative 6 %   Monocytes Absolute 0.5 0.1 - 1.0 K/uL   Eosinophils Relative 1 %   Eosinophils Absolute 0.1 0.0 - 0.7 K/uL   Basophils Relative 0 %   Basophils Absolute 0.0 0.0 - 0.1 K/uL  Lactic acid, plasma     Status: None   Collection Time: 11/17/17  9:46 AM  Result Value Ref Range   Lactic Acid, Venous 1.5 0.5 - 1.9 mmol/L  Protime-INR     Status: None   Collection Time: 11/17/17  9:46 AM  Result Value Ref Range   Prothrombin Time 14.1 11.4 - 15.2 seconds   INR 1.10   Troponin I     Status: Abnormal   Collection Time: 11/17/17  9:46 AM  Result Value Ref Range   Troponin I 0.03 (HH) <0.03 ng/mL    Comment: CRITICAL RESULT CALLED TO, READ BACK BY AND VERIFIED WITH: WILEY,E AT 11:05AM ON 11/17/17 BY FESTERMAN,C   Lactic acid, plasma     Status: None   Collection Time: 11/17/17 10:50 AM  Result Value Ref Range   Lactic Acid, Venous 1.4 0.5 - 1.9 mmol/L  Sample to Blood Bank     Status: None    Collection  Time: 11/17/17 10:50 AM  Result Value Ref Range   Blood Bank Specimen SAMPLE AVAILABLE FOR TESTING    Sample Expiration 11/20/2017   Type and screen     Status: None   Collection Time: 11/17/17 10:50 AM  Result Value Ref Range   ABO/RH(D) A POS    Antibody Screen NEG    Sample Expiration 11/20/2017   CBC     Status: Abnormal   Collection Time: 11/17/17  1:52 PM  Result Value Ref Range   WBC 6.5 4.0 - 10.5 K/uL   RBC 3.16 (L) 3.87 - 5.11 MIL/uL   Hemoglobin 9.2 (L) 12.0 - 15.0 g/dL   HCT 28.9 (L) 36.0 - 46.0 %   MCV 91.5 78.0 - 100.0 fL   MCH 29.1 26.0 - 34.0 pg   MCHC 31.8 30.0 - 36.0 g/dL   RDW 13.9 11.5 - 15.5 %   Platelets 134 (L) 150 - 400 K/uL  Troponin I (q 6hr x 3)     Status: Abnormal   Collection Time: 11/17/17  8:24 PM  Result Value Ref Range   Troponin I 0.03 (HH) <0.03 ng/mL    Comment: CRITICAL VALUE NOTED.  VALUE IS CONSISTENT WITH PREVIOUSLY REPORTED AND CALLED VALUE.  Basic metabolic panel     Status: Abnormal   Collection Time: 11/18/17  2:32 AM  Result Value Ref Range   Sodium 142 135 - 145 mmol/L   Potassium 4.0 3.5 - 5.1 mmol/L   Chloride 112 (H) 101 - 111 mmol/L   CO2 21 (L) 22 - 32 mmol/L   Glucose, Bld 91 65 - 99 mg/dL   BUN 23 (H) 6 - 20 mg/dL   Creatinine, Ser 0.91 0.44 - 1.00 mg/dL   Calcium 8.1 (L) 8.9 - 10.3 mg/dL   GFR calc non Af Amer 50 (L) >60 mL/min   GFR calc Af Amer 58 (L) >60 mL/min    Comment: (NOTE) The eGFR has been calculated using the CKD EPI equation. This calculation has not been validated in all clinical situations. eGFR's persistently <60 mL/min signify possible Chronic Kidney Disease.    Anion gap 9 5 - 15  CBC     Status: Abnormal   Collection Time: 11/18/17  2:32 AM  Result Value Ref Range   WBC 4.7 4.0 - 10.5 K/uL   RBC 2.86 (L) 3.87 - 5.11 MIL/uL   Hemoglobin 8.4 (L) 12.0 - 15.0 g/dL   HCT 26.1 (L) 36.0 - 46.0 %   MCV 91.3 78.0 - 100.0 fL   MCH 29.4 26.0 - 34.0 pg   MCHC 32.2 30.0 - 36.0 g/dL   RDW  14.1 11.5 - 15.5 %   Platelets 129 (L) 150 - 400 K/uL  Troponin I     Status: Abnormal   Collection Time: 11/18/17  2:32 AM  Result Value Ref Range   Troponin I 0.03 (HH) <0.03 ng/mL    Comment: CRITICAL VALUE NOTED.  VALUE IS CONSISTENT WITH PREVIOUSLY REPORTED AND CALLED VALUE.    Dg Chest 2 View  Result Date: 11/17/2017 CLINICAL DATA:  Abdominal pain and rectal bleeding beginning today. EXAM: CHEST  2 VIEW COMPARISON:  09/05/2017 FINDINGS: Artifact overlies the chest. Mild cardiomegaly. Aortic atherosclerosis with calcification and unfolding. Abnormal density at the lung bases left more than right. Probable basilar pneumonia, left more than right. Small pleural effusions in the posterior costophrenic angles. IMPRESSION: Lower lobe pneumonia left worse than right. Aortic atherosclerosis. Electronically Signed   By: Nelson Chimes  M.D.   On: 11/17/2017 11:30   Ct Abdomen Pelvis W Contrast  Result Date: 11/17/2017 CLINICAL DATA:  Recurrent rectal bleeding. EXAM: CT ABDOMEN AND PELVIS WITH CONTRAST TECHNIQUE: Multidetector CT imaging of the abdomen and pelvis was performed using the standard protocol following bolus administration of intravenous contrast. CONTRAST:  157m ISOVUE-300 IOPAMIDOL (ISOVUE-300) INJECTION 61% COMPARISON:  10/25/2015 FINDINGS: Lower chest: Cardiomegaly. Basilar pulmonary scarring. Chronically elevated left hemidiaphragm. Hepatobiliary: The liver parenchyma shows calcified granuloma in the right lobe. The liver is small, suggesting cirrhosis. There is moderate dilatation of the biliary ductal tree but no evidence of ductal stone or pancreatic head mass. Pancreas: Moderate atrophy.  No focal finding. Spleen: Normal.  Not enlarged. Adrenals/Urinary Tract: Adrenal glands are normal. Bilateral simple appearing renal cysts. No acute renal finding. Bladder appears normal. Stomach/Bowel: New large amount of gas and fecal matter throughout the colon. I do not see evidence of a mass or  of colitis. No abnormal small bowel finding by CT. Vascular/Lymphatic: Aortic atherosclerosis. No aneurysm. IVC is normal. No retroperitoneal adenopathy. Reproductive: Previous hysterectomy.  No pelvic mass. Other: No free fluid or air. Musculoskeletal: Advanced chronic degenerative changes of the spine. IMPRESSION: No acute finding by CT. Large amount of gas and fecal matter within the colon but no sign of mass or inflammatory disease. Probable cirrhosis. Chronic biliary ductal dilatation, slightly more prominent than on the previous exam. No sign of obstructing lesion however. Aortic atherosclerosis. Advanced degenerative disease of the spine. Electronically Signed   By: MNelson ChimesM.D.   On: 11/17/2017 12:56    ROS Blood pressure (!) 146/66, pulse 69, temperature 98.4 F (36.9 C), temperature source Oral, resp. rate 16, height 5' 4" (1.626 m), weight 127 lb 16 oz (58.1 kg), SpO2 96 %. Physical Exam Alert and oriented. Skin warm and dry. Oral mucosa is moist.   . Sclera anicteric, conjunctivae is pink. Thyroid not enlarged. No cervical lymphadenopathy. Lungs clear. Heart regular rate and rhythm.  Abdomen is soft. Bowel sounds are positive. No hepatomegaly. No abdominal masses felt. No tenderness.  No edema to lower extremities.  .  Assessment/Plan: Lower GI bleed. Last colonoscopy in 2015. Bleeding scan revealed  Subtle findings of activity in the pelvis suggetsive of rectosigmoid low level bleeding. Diverticular bleed in the differential. Dr. RLaural Goldenis aware.    Brean Carberry W 11/18/2017, 8:14 AM

## 2017-11-18 NOTE — Progress Notes (Signed)
CRITICAL VALUE ALERT  Critical Value:  Troponin 0.03  Date & Time Notied:  12/20  Provider Notified: [id number]  Orders Received/Actions taken: none - troponin unchanged

## 2017-11-18 NOTE — Progress Notes (Signed)
*  PRELIMINARY RESULTS* Echocardiogram 2D Echocardiogram has been performed.  Jeryl Columbialliott, Gamal Todisco 11/18/2017, 4:34 PM

## 2017-11-19 DIAGNOSIS — K625 Hemorrhage of anus and rectum: Secondary | ICD-10-CM | POA: Diagnosis not present

## 2017-11-19 DIAGNOSIS — K922 Gastrointestinal hemorrhage, unspecified: Secondary | ICD-10-CM | POA: Diagnosis not present

## 2017-11-19 DIAGNOSIS — D62 Acute posthemorrhagic anemia: Secondary | ICD-10-CM | POA: Diagnosis not present

## 2017-11-19 LAB — BASIC METABOLIC PANEL
ANION GAP: 9 (ref 5–15)
BUN: 10 mg/dL (ref 6–20)
CALCIUM: 8.4 mg/dL — AB (ref 8.9–10.3)
CHLORIDE: 107 mmol/L (ref 101–111)
CO2: 25 mmol/L (ref 22–32)
Creatinine, Ser: 0.67 mg/dL (ref 0.44–1.00)
GFR calc non Af Amer: >60 mL/min (ref >60)
GLUCOSE: 94 mg/dL (ref 65–99)
Potassium: 3.8 mmol/L (ref 3.5–5.1)
Sodium: 141 mmol/L (ref 135–145)

## 2017-11-19 LAB — CBC
HCT: 26.6 % — ABNORMAL LOW (ref 36.0–46.0)
HEMOGLOBIN: 8.4 g/dL — AB (ref 12.0–15.0)
MCH: 29.1 pg (ref 26.0–34.0)
MCHC: 31.6 g/dL (ref 30.0–36.0)
MCV: 92 fL (ref 78.0–100.0)
Platelets: 139 10*3/uL — ABNORMAL LOW (ref 150–400)
RBC: 2.89 MIL/uL — AB (ref 3.87–5.11)
RDW: 13.6 % (ref 11.5–15.5)
WBC: 4.8 10*3/uL (ref 4.0–10.5)

## 2017-11-19 LAB — URINE CULTURE: Culture: <10000 — AB

## 2017-11-19 LAB — ECHOCARDIOGRAM COMPLETE
HEIGHTINCHES: 64 in
Weight: 2047.99 oz

## 2017-11-19 LAB — HEMOGLOBIN AND HEMATOCRIT, BLOOD
HCT: 30.9 % — ABNORMAL LOW (ref 36.0–46.0)
Hemoglobin: 9.8 g/dL — ABNORMAL LOW (ref 12.0–15.0)

## 2017-11-19 MED ORDER — SENNOSIDES-DOCUSATE SODIUM 8.6-50 MG PO TABS: 2.0000 | ORAL_TABLET | Freq: Every day | ORAL | 1 refills | Status: AC | PRN

## 2017-11-19 NOTE — Discharge Summary (Signed)
Physician Discharge Summary  Lynn Nichols BJY:[id number] DOB: [birth year] DOA: 11/17/2017  PCP: [facility]  Admit date: 11/17/2017 Discharge date: 11/19/2017  Time spent: 45 minutes  Recommendations for Outpatient Follow-up:  -To be discharged home today. -Advised to follow-up with primary care provider in 2 weeks at which time blood counts should be followed.  Discharge Diagnoses:  Principal Problem:   Lower GI bleeding Active Problems:   Essential hypertension   Acute blood loss anemia   Diverticulosis   Rectal bleed   Elevated troponin   Hypokalemia   Discharge Condition: Stable and improved  Filed Weights   11/17/17 0824 11/18/17 0865  Weight: 58.1 kg (128 lb) 58.1 kg (127 lb 16 oz)    History of present illness:    Lynn Nichols is a 83 y.o. female with a history of prior GI bleeding who last had a colonoscopy in 2015 with findings of diverticulosis and a rectal ulcer.  She has had several admissions since for rectal bleeding and if patient and family have elected not to pursue colonoscopy.  Patient and daughter stated that she had a bloody bowel movement this morning and was brought to the hospital.  While in the ED has had at least 2 more episodes and had another episode upon arrival to the floor.  She is hemodynamically stable, her hemoglobin is 10.5, was 13 about a month ago.  Labs are significant for potassium of 3.4, troponin of 0.03.  Admission requested.   Hospital Course:   Rectal bleeding -In a patient with a known history of diverticulosis and a rectal ulcer. -Patient and family would like to treat conservatively if possible. -GI has been consulted and recommendations are for conservative management as well -Her hemoglobin is down to 8.7, she has had no further bleeding episodes overnight. -Has not received blood transfusion this admission.  Elevated troponin -Suspect demand ischemia due to rectal  bleeding. -Patient denies chest pain, EKG without acute ischemic changes. -2D echo: Ejection fraction of 65-70% with normal wall motion and normal left ventricular diastolic parameters. -No further cardiac workup planned for at present.  Acute blood loss anemia -Due to ongoing rectal bleeding. -No blood transfusions required this admission  Hypertension -Fair control, do not anticipate medication changes while in the hospital.  Abnormal chest x-ray -Chest x-ray makes note of bilateral lower lobe pneumonia, however patient has no symptoms specifically no cough, leukocytosis and no fever. -I have elected to observe for now without antibiotic therapy.    Procedures:  None  Consultations:  Gastroenterology  Discharge Instructions  Discharge Instructions    Diet - low sodium heart healthy   Complete by:  As directed    Increase activity slowly   Complete by:  As directed      Allergies as of 11/19/2017      Reactions   Aspirin    Advised by MD not take due to bleed   Penicillins Other (See Comments)   Has patient had a PCN reaction causing immediate rash, facial/tongue/throat swelling, SOB or lightheadedness with hypotension: No Has patient had a PCN reaction causing severe rash involving mucus membranes or skin necrosis: No Has patient had a PCN reaction that required hospitalization YES Has patient had a PCN reaction occurring within the last 10 years: No If all of the above answers are "NO", then may proceed with Cephalosporin use. "PASSES OUT"      Medication List    STOP taking these medications  docusate sodium 100 MG capsule Commonly known as:  COLACE     TAKE these medications   ferrous sulfate 325 (65 FE) MG EC tablet Take 1 tablet (325 mg total) by mouth 2 (two) times daily with a meal. What changed:  when to take this   latanoprost 0.005 % ophthalmic solution Commonly known as:  XALATAN Place 1 drop into both eyes at bedtime.   NIFEdipine 30  MG 24 hr tablet Commonly known as:  PROCARDIA-XL/ADALAT CC Take 1 tablet by mouth daily.   PROAIR HFA 108 (90 Base) MCG/ACT inhaler Generic drug:  albuterol Inhale 2 puffs into the lungs every 6 (six) hours as needed. For shortness of breath   senna-docusate 8.6-50 MG tablet Commonly known as:  Senokot-S Take 2 tablets by mouth daily as needed for mild constipation.   traMADol 50 MG tablet Commonly known as:  ULTRAM Take 50 mg by mouth 3 (three) times daily as needed for moderate pain.   VICKS VAPORUB EX Apply 1 application topically daily as needed (pain).      Allergies  Allergen Reactions  . Aspirin     Advised by MD not take due to bleed  . Penicillins Other (See Comments)    Has patient had a PCN reaction causing immediate rash, facial/tongue/throat swelling, SOB or lightheadedness with hypotension: No Has patient had a PCN reaction causing severe rash involving mucus membranes or skin necrosis: No Has patient had a PCN reaction that required hospitalization YES Has patient had a PCN reaction occurring within the last 10 years: No If all of the above answers are "NO", then may proceed with Cephalosporin use. "PASSES OUT"   Follow-up Information    [facility] Schedule an appointment as soon as possible for a visit in 2 week(s).   Contact information: [address] [place] [address] [phone]            The results of significant diagnostics from this hospitalization (including imaging, microbiology, ancillary and laboratory) are listed below for reference.    Significant Diagnostic Studies: Dg Chest 2 View  Result Date: 11/17/2017 CLINICAL DATA:  Abdominal pain and rectal bleeding beginning today. EXAM: CHEST  2 VIEW COMPARISON:  09/05/2017 FINDINGS: Artifact overlies the chest. Mild cardiomegaly. Aortic atherosclerosis with calcification and unfolding. Abnormal density at the lung bases left more than right. Probable basilar  pneumonia, left more than right. Small pleural effusions in the posterior costophrenic angles. IMPRESSION: Lower lobe pneumonia left worse than right. Aortic atherosclerosis. Electronically Signed   By: Paulina FusiMark  Shogry M.D.   On: 11/17/2017 11:30   Ct Abdomen Pelvis W Contrast  Result Date: 11/17/2017 CLINICAL DATA:  Recurrent rectal bleeding. EXAM: CT ABDOMEN AND PELVIS WITH CONTRAST TECHNIQUE: Multidetector CT imaging of the abdomen and pelvis was performed using the standard protocol following bolus administration of intravenous contrast. CONTRAST:  100mL ISOVUE-300 IOPAMIDOL (ISOVUE-300) INJECTION 61% COMPARISON:  10/25/2015 FINDINGS: Lower chest: Cardiomegaly. Basilar pulmonary scarring. Chronically elevated left hemidiaphragm. Hepatobiliary: The liver parenchyma shows calcified granuloma in the right lobe. The liver is small, suggesting cirrhosis. There is moderate dilatation of the biliary ductal tree but no evidence of ductal stone or pancreatic head mass. Pancreas: Moderate atrophy.  No focal finding. Spleen: Normal.  Not enlarged. Adrenals/Urinary Tract: Adrenal glands are normal. Bilateral simple appearing renal cysts. No acute renal finding. Bladder appears normal. Stomach/Bowel: New large amount of gas and fecal matter throughout the colon. I do not see evidence of a mass or  of colitis. No abnormal small bowel finding by CT. Vascular/Lymphatic: Aortic atherosclerosis. No aneurysm. IVC is normal. No retroperitoneal adenopathy. Reproductive: Previous hysterectomy.  No pelvic mass. Other: No free fluid or air. Musculoskeletal: Advanced chronic degenerative changes of the spine. IMPRESSION: No acute finding by CT. Large amount of gas and fecal matter within the colon but no sign of mass or inflammatory disease. Probable cirrhosis. Chronic biliary ductal dilatation, slightly more prominent than on the previous exam. No sign of obstructing lesion however. Aortic atherosclerosis. Advanced degenerative  disease of the spine. Electronically Signed   By: Paulina FusiMark  Shogry M.D.   On: 11/17/2017 12:56    Microbiology: Recent Results (from the past 240 hour(s))  Urine culture     Status: Abnormal   Collection Time: 11/17/17  8:59 AM  Result Value Ref Range Status   Specimen Description URINE, CLEAN CATCH  Final   Special Requests NONE  Final   Culture (A)  Final    <10,000 COLONIES/mL INSIGNIFICANT GROWTH Performed at [facility], [address] [place], [place] [address]    Report Status 11/19/2017 FINAL  Final     Labs: Basic Metabolic Panel: Recent Labs  Lab 11/17/17 0946 11/18/17 0232 11/19/17 0455  NA 140 142 141  K 3.4* 4.0 3.8  CL 104 112* 107  CO2 25 21* 25  GLUCOSE 151* 91 94  BUN 21* 23* 10  CREATININE 0.90 0.91 0.67  CALCIUM 8.4* 8.1* 8.4*   Liver Function Tests: Recent Labs  Lab 11/17/17 0946  AST 19  ALT 9*  ALKPHOS 42  BILITOT 0.6  PROT 6.7  ALBUMIN 3.3*   No results for input(s): LIPASE, AMYLASE in the last 168 hours. No results for input(s): AMMONIA in the last 168 hours. CBC: Recent Labs  Lab 11/17/17 0946 11/17/17 1352 11/18/17 0232 11/19/17 0455 11/19/17 1208  WBC 7.6 6.5 4.7 4.8  --   NEUTROABS 6.2  --   --   --   --   HGB 10.5* 9.2* 8.4* 8.4* 9.8*  HCT 32.8* 28.9* 26.1* 26.6* 30.9*  MCV 91.4 91.5 91.3 92.0  --   PLT 158 134* 129* 139*  --    Cardiac Enzymes: Recent Labs  Lab 11/17/17 0946 11/17/17 2024 11/18/17 0232 11/18/17 0837  TROPONINI 0.03* 0.03* 0.03* 0.03*   BNP: BNP (last 3 results) No results for input(s): BNP in the last 8760 hours.  ProBNP (last 3 results) No results for input(s): PROBNP in the last 8760 hours.  CBG: No results for input(s): GLUCAP in the last 168 hours.     Signed:  Chaya Janstela Hernandez Acosta  [facility] Pager: [phone] 11/19/2017, 2:47 PM

## 2017-11-19 NOTE — Progress Notes (Signed)
IV removed, WNL. D/C instructions given to pt. Verbalized understanding. Pt family members at bedside to transport home.

## 2017-11-19 NOTE — Progress Notes (Signed)
  Subjective:  Patient has no complaints.  She wants to know when she is going home.  She has been passing urine but has not had a BM today.  She has good appetite.  According to her daughter Steward DroneBrenda she ate most of her lunch.  Objective: Blood pressure 132/74, pulse 74, temperature 98 F (36.7 C), temperature source Oral, resp. rate 20, height 5\' 4"  (1.626 m), weight 127 lb 16 oz (58.1 kg), SpO2 95 %. Patient is alert and in no acute distress. Abdomen remains soft and nontender without organomegaly or masses.  Labs/studies Results:  Recent Labs    11/17/17 1352 11/18/17 0232 11/19/17 0455 11/19/17 1208  WBC 6.5 4.7 4.8  --   HGB 9.2* 8.4* 8.4* 9.8*  HCT 28.9* 26.1* 26.6* 30.9*  PLT 134* 129* 139*  --     BMET  Recent Labs    11/17/17 0946 11/18/17 0232 11/19/17 0455  NA 140 142 141  K 3.4* 4.0 3.8  CL 104 112* 107  CO2 25 21* 25  GLUCOSE 151* 91 94  BUN 21* 23* 10  CREATININE 0.90 0.91 0.67  CALCIUM 8.4* 8.1* 8.4*    LFT  Recent Labs    11/17/17 0946  PROT 6.7  ALBUMIN 3.3*  AST 19  ALT 9*  ALKPHOS 42  BILITOT 0.6    PT/INR  Recent Labs    11/17/17 0946  LABPROT 14.1  INR 1.10     Assessment:  Rectal bleeding most likely secondary to colonic ulcer and rectosigmoid junction initially discovered in 2015.  Constipation may have triggered bleeding.  She has stopped bleeding.  Hemoglobin this morning was 8.4 and at the wound was 9.8.  I asked lab personnel to re-run the test and see if specimen.  Hemoglobin this morning was 8.7 and 9.8 g at noon.  Recommendations:  Continue Peri-Colace. Patient stable for discharge. No NSAIDs.

## 2018-01-25 ENCOUNTER — Emergency Department (HOSPITAL_COMMUNITY)
Admission: EM | Admit: 2018-01-25 | Discharge: 2018-01-26 | Disposition: A | Discharge status: Home / Self Care | Payer: Medicare Other | Attending: Emergency Medicine | Admitting: Emergency Medicine

## 2018-01-25 ENCOUNTER — Other Ambulatory Visit: Payer: Self-pay

## 2018-01-25 ENCOUNTER — Encounter (HOSPITAL_COMMUNITY): Payer: Self-pay | Admitting: Emergency Medicine

## 2018-01-25 DIAGNOSIS — I10 Essential (primary) hypertension: Secondary | ICD-10-CM | POA: Diagnosis not present

## 2018-01-25 DIAGNOSIS — I483 Typical atrial flutter: Secondary | ICD-10-CM | POA: Diagnosis not present

## 2018-01-25 DIAGNOSIS — F17290 Nicotine dependence, other tobacco product, uncomplicated: Secondary | ICD-10-CM | POA: Insufficient documentation

## 2018-01-25 DIAGNOSIS — R42 Dizziness and giddiness: Secondary | ICD-10-CM | POA: Diagnosis present

## 2018-01-25 LAB — CBC WITH DIFFERENTIAL/PLATELET
BASOS PCT: 0 %
Basophils Absolute: 0 10*3/uL (ref 0.0–0.1)
EOS ABS: 0.2 10*3/uL (ref 0.0–0.7)
Eosinophils Relative: 4 %
HCT: 40 % (ref 36.0–46.0)
HEMOGLOBIN: 12.3 g/dL (ref 12.0–15.0)
Lymphocytes Relative: 33 %
Lymphs Abs: 1.6 10*3/uL (ref 0.7–4.0)
MCH: 28.3 pg (ref 26.0–34.0)
MCHC: 30.8 g/dL (ref 30.0–36.0)
MCV: 92.2 fL (ref 78.0–100.0)
Monocytes Absolute: 0.5 10*3/uL (ref 0.1–1.0)
Monocytes Relative: 10 %
NEUTROS PCT: 53 %
Neutro Abs: 2.6 10*3/uL (ref 1.7–7.7)
Platelets: 197 10*3/uL (ref 150–400)
RBC: 4.34 MIL/uL (ref 3.87–5.11)
RDW: 13.7 % (ref 11.5–15.5)
WBC: 4.8 10*3/uL (ref 4.0–10.5)

## 2018-01-25 LAB — COMPREHENSIVE METABOLIC PANEL
ALK PHOS: 48 U/L (ref 38–126)
ALT: 14 U/L (ref 14–54)
ANION GAP: 12 (ref 5–15)
AST: 26 U/L (ref 15–41)
Albumin: 3.9 g/dL (ref 3.5–5.0)
BUN: 14 mg/dL (ref 6–20)
CHLORIDE: 102 mmol/L (ref 101–111)
CO2: 27 mmol/L (ref 22–32)
CREATININE: 1 mg/dL (ref 0.44–1.00)
Calcium: 9.1 mg/dL (ref 8.9–10.3)
GFR calc non Af Amer: 44 mL/min — ABNORMAL LOW (ref >60)
GFR, EST AFRICAN AMERICAN: 51 mL/min — AB (ref >60)
Glucose, Bld: 113 mg/dL — ABNORMAL HIGH (ref 65–99)
Potassium: 3.5 mmol/L (ref 3.5–5.1)
Sodium: 141 mmol/L (ref 135–145)
Total Bilirubin: 0.4 mg/dL (ref 0.3–1.2)
Total Protein: 7.7 g/dL (ref 6.5–8.1)

## 2018-01-25 LAB — CBG MONITORING, ED
GLUCOSE-CAPILLARY: 96 mg/dL (ref 65–99)
GLUCOSE-CAPILLARY: 102 mg/dL — AB (ref 65–99)

## 2018-01-25 LAB — I-STAT CG4 LACTIC ACID, ED: LACTIC ACID, VENOUS: 0.88 mmol/L (ref 0.5–1.9)

## 2018-01-25 MED ORDER — SODIUM CHLORIDE 0.9 % IV BOLUS (SEPSIS)
500.0000 mL | Freq: Once | INTRAVENOUS | Status: AC
  Administered 2018-01-25: 500 mL via INTRAVENOUS

## 2018-01-25 NOTE — ED Triage Notes (Signed)
Pt c/o of dizziness since this morning. CBG 97.

## 2018-01-25 NOTE — ED Provider Notes (Signed)
Emergency Department Provider Note   I have reviewed the triage vital signs and the nursing notes.   HISTORY  Chief Complaint Dizziness   HPI Lynn Nichols is a 84 y.o. female with PMH of HTN, arthritis, and diverticulosis presents the emergency department for evaluation of lightheadedness throughout the day.  No change with head position.  Patient denies any vertigo symptoms.  She is not having any chest pain, palpitations, shortness of breath.  No new medications.  She reports intermittent numbness in the right leg that is no worse today than normal.  Her speech disturbance.  Reports eating and drinking normally.  She has no prior history of stroke, coronary artery disease, peripheral artery disease.  Denies any nausea, vomiting, diarrhea.   Past Medical History:  Diagnosis Date  . Arthritis   . Back pain   . Diverticulosis   . Headache   . Hypertension   . Lower GI bleeding     Patient Active Problem List   Diagnosis Date Noted  . Lower GI bleeding 11/17/2017  . Rectal bleed 11/17/2017  . Elevated troponin 11/17/2017  . Hypokalemia 11/17/2017  . Palliative care encounter   . Goals of care, counseling/discussion   . Encounter for hospice care discussion   . Lower GI bleed 04/22/2017  . Hypertension 04/22/2017  . Diverticulosis 04/22/2017  . Acute blood loss anemia 07/05/2014  . Acute lower gastrointestinal bleeding 06/30/2014  . Essential hypertension 06/30/2014  . Acute lower GI bleeding 06/30/2014    Past Surgical History:  Procedure Laterality Date  . ABDOMINAL HYSTERECTOMY    . CHOLECYSTECTOMY    . COLONOSCOPY N/A 07/01/2014   Procedure: COLONOSCOPY;  Surgeon: Malissa Hippo, MD;  Location: [facility];  Service: Endoscopy;  Laterality: N/A;  . COLONOSCOPY N/A 07/05/2014   Procedure: COLONOSCOPY;  Surgeon: Malissa Hippo, MD;  Location: [facility];  Service: Endoscopy;  Laterality: N/A;    Current Outpatient Rx  . Order #: [id number] Class:  Historical Med  . Order #: [id number] Class: Historical Med  . Order #: [id number] Class: Historical Med  . Order #: [id number] Class: Historical Med  . Order #: [id number] Class: Normal  . Order #: [id number] Class: Historical Med  . Order #: [id number] Class: Historical Med  . Order #: [id number] Class: Print  . Order #: [id number] Class: Historical Med    Allergies Aspirin and Penicillins  Family History  Problem Relation Age of Onset  . Hypertension Mother   . Hypertension Father   . Diabetes Other     Social History Social History   Tobacco Use  . Smoking status: Never Smoker  . Smokeless tobacco: Current User    Types: Snuff  Substance Use Topics  . Alcohol use: No    Alcohol/week: 0.0 oz  . Drug use: No    Review of Systems  Constitutional: No fever/chills. Positive lightheadedness.  Eyes: No visual changes. ENT: No sore throat. Cardiovascular: Denies chest pain. Respiratory: Denies shortness of breath. Gastrointestinal: No abdominal pain.  No nausea, no vomiting.  No diarrhea.  No constipation. Genitourinary: Negative for dysuria. Musculoskeletal: Negative for back pain. Skin: Negative for rash. Neurological: Negative for headaches, focal weakness or numbness.  10-point ROS otherwise negative.  ____________________________________________   PHYSICAL EXAM:  VITAL SIGNS: ED Triage Vitals  Enc Vitals Group     BP 01/25/18 1744 (!) 158/84     Pulse Rate 01/25/18 1744 90     Resp 01/25/18 1744 18     Temp 01/25/18 1744 98.2 F (36.8 C)  Temp Source 01/25/18 1744 Oral     SpO2 01/25/18 1744 99 %     Weight 01/25/18 1745 130 lb (59 kg)     Height 01/25/18 1745 5\' 4"  (1.626 m)   Constitutional: Alert and oriented. Well appearing and in no acute distress. Eyes: Conjunctivae are normal. Head: Atraumatic. Ears:  Healthy appearing ear canals and TMs seen bilaterally with some cerumen partially obstructing the TM view bilaterally.  Nose: No  congestion/rhinnorhea. Mouth/Throat: Mucous membranes are slightly dry.  Neck: No stridor. Cardiovascular: Normal rate, regular rhythm. Good peripheral circulation. Grossly normal heart sounds.   Respiratory: Normal respiratory effort.  No retractions. Lungs CTAB. Gastrointestinal: Soft and nontender. No distention.  Musculoskeletal: No lower extremity tenderness nor edema. No gross deformities of extremities. Neurologic:  Normal speech and language. No gross focal neurologic deficits are appreciated. Normal CN exam 2-12. No pronator drift. Normal strength and sensation in the upper and lower extremities.  Skin:  Skin is warm, dry and intact. No rash noted.  ____________________________________________   LABS (all labs ordered are listed, but only abnormal results are displayed)  Labs Reviewed  COMPREHENSIVE METABOLIC PANEL - Abnormal; Notable for the following components:      Result Value   Glucose, Bld 113 (*)    GFR calc non Af Amer 44 (*)    GFR calc Af Amer 51 (*)    All other components within normal limits  CBG MONITORING, ED - Abnormal; Notable for the following components:   Glucose-Capillary 102 (*)    All other components within normal limits  CBC WITH DIFFERENTIAL/PLATELET  CBG MONITORING, ED  I-STAT TROPONIN, ED  I-STAT CG4 LACTIC ACID, ED   ____________________________________________  EKG   EKG Interpretation  Date/Time:  Tuesday January 25 2018 17:42:24 EST Ventricular Rate:  86 PR Interval:    QRS Duration: 78 QT Interval:  360 QTC Calculation: 430 R Axis:   -50 Text Interpretation:  Atrial flutter with variable A-V block Pulmonary disease pattern Left anterior fascicular block Minimal voltage criteria for LVH, may be normal variant Abnormal ECG No STEMI.  Confirmed by Alona Bene [id number]) on 01/25/2018 10:14:34 PM        ____________________________________________  RADIOLOGY  None ____________________________________________   PROCEDURES  Procedure(s) performed:   Procedures  None ____________________________________________   INITIAL IMPRESSION / ASSESSMENT AND PLAN / ED COURSE  Pertinent labs & imaging results that were available during my care of the patient were reviewed by me and considered in my medical decision making (see chart for details).  Patient presents to the emergency department for evaluation of persistent lightheadedness throughout the day.  Her initial EKG shows some concern for atrial flutter which appears to be new.  Normal neurological exam.  No strokelike symptoms. Plan for IVF, repeat EKG, and labs.   12:23 AM Patient feeling much better after IVF. I discussed the a-flutter with the patient and family in detail. This may have caused her symptoms today. She is not a good anticoagulation candidate. She was admitted in December 2018 with rectal bleeding and has had multiple diverticular bleeds in the past. Her flutter is rate controlled. She is not having CP or palpitations. Labs including troponin were reviewed and otherwise unremarkable. Given the patient's age and lack of symptoms I did offer admission for observation and w/u but also think it would be reasonable to return home and f/u with Cardiology tomorrow. She understands the CVA risk. She has recently opted for decreased testing in the setting of rectal  bleed and has begun having palliative care discussions with family. She verbalizes the desire to return home and return to the ED if symptoms worsen and call the Cardiologist in the AM. Number provided. Daughter and other family involved in this risk/benefit discussion.  ____________________________________________  FINAL CLINICAL IMPRESSION(S) / ED DIAGNOSES  Final diagnoses:  Typical atrial flutter (HCC)     MEDICATIONS GIVEN DURING THIS VISIT:  Medications   sodium chloride 0.9 % bolus 500 mL (0 mLs Intravenous Stopped 01/25/18 2354)     Note:  This document was prepared using Dragon voice recognition software and may include unintentional dictation errors.  Alona BeneJoshua Long, MD Emergency Medicine    Long, Arlyss RepressJoshua G, MD 01/26/18 [phone]

## 2018-01-26 DIAGNOSIS — I483 Typical atrial flutter: Secondary | ICD-10-CM | POA: Diagnosis not present

## 2018-01-26 LAB — I-STAT TROPONIN, ED: TROPONIN I, POC: 0.02 ng/mL (ref 0.00–0.08)

## 2018-01-26 NOTE — Discharge Instructions (Signed)
You were seen in the ED today with an abnormal heart rhythm. We performed testing and have made a decision together to return home today but call the Cardiologist listed first thing tomorrow morning to schedule a follow up appointment. Return to the ED immediately with any chest pain, difficulty breathing, or worsening shortness of breath.

## 2018-02-13 NOTE — Progress Notes (Signed)
Cardiology Office Note   Date:  02/15/2018   ID:  Lynn Hailnnie H Laffoon, DOB [birth year], MRN [id number]  [facility]  [facility]  Cardiologist:   Charlton HawsPeter Eletha Culbertson, MD   No chief complaint on file.     History of Present Illness: Lynn Nichols is a [age] y.o. female who presents for consultation regarding dizziness. Referred by Alona BeneJoshua Long MD [facility] ER. Reviewed notes from ER visit 01/25/18 No vertigo symptoms Some numbness in right leg No prior history of vascular disease. History of HTN and Lower GI bleeding ECG showed atrial flutter rate 86 Felt better after iv fluids Since her rate was not up and she has had significant rectal/GI bleeds as recent as 10/2017 no anticoagulation. Troponin negative and Hct 40   Echo 11/18/17 EF 65-70%  Mild MR/AR  Mild LAE  She has no cardiac symptoms No chest pain, dyspnea , edema or palpitations. She ambulates with a cane but has fallen multiple Times She has 12 children but 6 have passed    Past Medical History:  Diagnosis Date  . Arthritis   . Back pain   . Diverticulosis   . Headache   . Hypertension   . Lower GI bleeding     Past Surgical History:  Procedure Laterality Date  . ABDOMINAL HYSTERECTOMY    . CHOLECYSTECTOMY    . COLONOSCOPY N/A 07/01/2014   Procedure: COLONOSCOPY;  Surgeon: Malissa HippoNajeeb U Rehman, MD;  Location: [facility];  Service: Endoscopy;  Laterality: N/A;  . COLONOSCOPY N/A 07/05/2014   Procedure: COLONOSCOPY;  Surgeon: Malissa HippoNajeeb U Rehman, MD;  Location: [facility];  Service: Endoscopy;  Laterality: N/A;     Current Outpatient Medications  Medication Sig Dispense Refill  . albuterol (PROAIR HFA) 108 (90 BASE) MCG/ACT inhaler Inhale 2 puffs into the lungs every 6 (six) hours as needed. For shortness of breath    . Camphor-Eucalyptus-Menthol (VICKS VAPORUB EX) Apply 1 application topically daily as needed (pain).    . clobetasol ointment (TEMOVATE) 0.05 %     . clonazePAM (KLONOPIN) 0.5 MG tablet Take 0.5  mg by mouth 3 (three) times daily as needed.     . desonide (DESOWEN) 0.05 % ointment     . ferrous sulfate 325 (65 FE) MG EC tablet Take 1 tablet (325 mg total) by mouth 2 (two) times daily with a meal. (Patient taking differently: Take 325 mg by mouth daily. ) 60 tablet 3  . latanoprost (XALATAN) 0.005 % ophthalmic solution Place 1 drop into both eyes at bedtime.     Marland Kitchen. NIFEdipine (PROCARDIA-XL/ADALAT CC) 30 MG 24 hr tablet Take 1 tablet by mouth daily.    Marland Kitchen. senna-docusate (SENOKOT-S) 8.6-50 MG tablet Take 2 tablets by mouth daily as needed for mild constipation. 30 tablet 1  . traMADol (ULTRAM) 50 MG tablet Take 50 mg by mouth 3 (three) times daily as needed for moderate pain.      No current facility-administered medications for this visit.     Allergies:   Aspirin and Penicillins    Social History:  The patient  reports that  has never smoked. Her smokeless tobacco use includes snuff. She reports that she does not drink alcohol or use drugs.   Family History:  The patient's family history includes Diabetes in her other; Hypertension in her father and mother.    ROS:  Please see the history of present illness.   Otherwise, review of systems are positive for none.  All other systems are reviewed and negative.    PHYSICAL EXAM: VS:  BP 140/64   Pulse (!) 104   Ht 5\' 4"  (1.626 m)   Wt 130 lb (59 kg)   BMI 22.31 kg/m  , BMI Body mass index is 22.31 kg/m. Affect appropriate Healthy:  appears stated age HEENT: normal Neck supple with no adenopathy JVP normal right  bruits no thyromegaly Lungs clear with no wheezing and good diaphragmatic motion Heart:  S1/S2 no murmur, no rub, gallop or click PMI normal Abdomen: benighn, BS positve, no tenderness, no AAA no bruit.  No HSM or HJR Distal pulses intact with no bruits No edema Neuro non-focal Skin warm and dry No muscular weakness    EKG:  01/26/18 atrial flutter rate 69 otherwise normal 02/15/18 Atrial flutter rate 92  otherwise normal    Recent Labs: 04/23/2017: Magnesium 1.9 01/25/2018: ALT 14; BUN 14; Creatinine, Ser 1.00; Hemoglobin 12.3; Platelets 197; Potassium 3.5; Sodium 141    Lipid Panel No results found for: CHOL, TRIG, HDL, CHOLHDL, VLDL, LDLCALC, LDLDIRECT    Wt Readings from Last 3 Encounters:  02/15/18 130 lb (59 kg)  01/25/18 130 lb (59 kg)  11/18/17 127 lb 16 oz (58.1 kg)      Other studies Reviewed: Additional studies/ records that were reviewed today include: ER notes 01/26/18 echo labs, CXR CT abdomen and notes MD.    ASSESSMENT AND PLAN:   1.  Atrial Flutter. No symptoms rate ok no excess bradycardia or tachycardia not good anticoagulation candidte  2. HTN:  Well controlled.  Continue current medications and low sodium Dash type diet.   3. LGI bleed:  Stable no melena avoid anticoagulation  4. Goals of Care:  Given age and previous palliative care initiative does not need routine cardiology f/u  5. Bruit: right f/u primary can consider duplex but unilateral carotid disease should not cause dizziness   Current medicines are reviewed at length with the patient today.  The patient does not have concerns regarding medicines.  The following changes have been made:  no change  Labs/ tests ordered today include: ECG   Orders Placed This Encounter  Procedures  . EKG 12-Lead     Disposition:   FU with cardiology PRN      Signed, Charlton Haws, MD  02/15/2018 1:43 PM    [facility] [address] [place], [place], [place]  [address] Phone: [phone]; Fax: [phone]

## 2018-02-15 ENCOUNTER — Encounter: Payer: Self-pay | Admitting: Cardiovascular Disease

## 2018-02-15 ENCOUNTER — Ambulatory Visit (INDEPENDENT_AMBULATORY_CARE_PROVIDER_SITE_OTHER): Payer: Medicare Other | Admitting: Cardiovascular Disease

## 2018-02-15 VITALS — BP 140/64 | HR 104 | Ht 64.0 in | Wt 130.0 lb

## 2018-02-15 DIAGNOSIS — I4892 Unspecified atrial flutter: Secondary | ICD-10-CM | POA: Diagnosis not present

## 2018-02-15 NOTE — Patient Instructions (Signed)
Medication Instructions:  Your physician recommends that you continue on your current medications as directed. Please refer to the Current Medication list given to you today.   Labwork: none  Testing/Procedures: none  Follow-Up: Your physician recommends that you schedule a follow-up appointment in: AS NEEDED    Any Other Special Instructions Will Be Listed Below (If Applicable).     If you need a refill on your cardiac medications before your next appointment, please call your pharmacy.   

## 2018-04-18 ENCOUNTER — Encounter (HOSPITAL_COMMUNITY): Payer: Self-pay

## 2018-04-18 ENCOUNTER — Other Ambulatory Visit: Payer: Self-pay

## 2018-04-18 ENCOUNTER — Emergency Department (HOSPITAL_COMMUNITY): Payer: Medicare Other

## 2018-04-18 ENCOUNTER — Emergency Department (HOSPITAL_COMMUNITY)
Admission: EM | Admit: 2018-04-18 | Discharge: 2018-04-18 | Disposition: A | Discharge status: Home / Self Care | Payer: Medicare Other | Attending: Emergency Medicine | Admitting: Emergency Medicine

## 2018-04-18 DIAGNOSIS — F17290 Nicotine dependence, other tobacco product, uncomplicated: Secondary | ICD-10-CM | POA: Diagnosis not present

## 2018-04-18 DIAGNOSIS — I10 Essential (primary) hypertension: Secondary | ICD-10-CM | POA: Insufficient documentation

## 2018-04-18 DIAGNOSIS — R4182 Altered mental status, unspecified: Secondary | ICD-10-CM | POA: Diagnosis present

## 2018-04-18 DIAGNOSIS — E876 Hypokalemia: Secondary | ICD-10-CM | POA: Diagnosis not present

## 2018-04-18 DIAGNOSIS — Z79899 Other long term (current) drug therapy: Secondary | ICD-10-CM | POA: Insufficient documentation

## 2018-04-18 LAB — URINALYSIS, ROUTINE W REFLEX MICROSCOPIC
BILIRUBIN URINE: NEGATIVE
Glucose, UA: NEGATIVE mg/dL
Hgb urine dipstick: NEGATIVE
KETONES UR: NEGATIVE mg/dL
LEUKOCYTES UA: NEGATIVE
Nitrite: NEGATIVE
Protein, ur: 30 mg/dL — AB
SPECIFIC GRAVITY, URINE: 1.011 (ref 1.005–1.030)
pH: 7 (ref 5.0–8.0)

## 2018-04-18 LAB — COMPREHENSIVE METABOLIC PANEL
ALBUMIN: 3.8 g/dL (ref 3.5–5.0)
ALK PHOS: 44 U/L (ref 38–126)
ALT: 12 U/L — AB (ref 14–54)
AST: 25 U/L (ref 15–41)
Anion gap: 9 (ref 5–15)
BUN: 11 mg/dL (ref 6–20)
CALCIUM: 8.8 mg/dL — AB (ref 8.9–10.3)
CHLORIDE: 99 mmol/L — AB (ref 101–111)
CO2: 30 mmol/L (ref 22–32)
CREATININE: 0.83 mg/dL (ref 0.44–1.00)
GFR calc Af Amer: >60 mL/min (ref >60)
GFR calc non Af Amer: 56 mL/min — ABNORMAL LOW (ref >60)
GLUCOSE: 139 mg/dL — AB (ref 65–99)
Potassium: 2.7 mmol/L — CL (ref 3.5–5.1)
SODIUM: 138 mmol/L (ref 135–145)
Total Bilirubin: 0.8 mg/dL (ref 0.3–1.2)
Total Protein: 7.7 g/dL (ref 6.5–8.1)

## 2018-04-18 LAB — CBC
HCT: 37.9 % (ref 36.0–46.0)
Hemoglobin: 12.1 g/dL (ref 12.0–15.0)
MCH: 28.2 pg (ref 26.0–34.0)
MCHC: 31.9 g/dL (ref 30.0–36.0)
MCV: 88.3 fL (ref 78.0–100.0)
PLATELETS: 189 10*3/uL (ref 150–400)
RBC: 4.29 MIL/uL (ref 3.87–5.11)
RDW: 14.3 % (ref 11.5–15.5)
WBC: 5.7 10*3/uL (ref 4.0–10.5)

## 2018-04-18 LAB — CBG MONITORING, ED: Glucose-Capillary: 122 mg/dL — ABNORMAL HIGH (ref 65–99)

## 2018-04-18 MED ORDER — POTASSIUM CHLORIDE ER 10 MEQ PO TBCR: 10.0000 meq | EXTENDED_RELEASE_TABLET | Freq: Every day | ORAL | 0 refills | Status: DC

## 2018-04-18 MED ORDER — POTASSIUM CHLORIDE CRYS ER 20 MEQ PO TBCR
40.0000 meq | EXTENDED_RELEASE_TABLET | Freq: Once | ORAL | Status: AC
  Administered 2018-04-18: 40 meq via ORAL
  Filled 2018-04-18: qty 2

## 2018-04-18 NOTE — ED Triage Notes (Signed)
Family brings patient to ED for confusion/slurred speech that started around 1645 yesterday. EMS came to assess patient, patient refused to go to hospital. Family states speech is improving. Patient reports of headache. Patient has no other  deficits noted.

## 2018-04-18 NOTE — Discharge Instructions (Signed)
Follow-up with your doctor in a week.  Get seen sooner if any problems

## 2018-04-18 NOTE — ED Provider Notes (Signed)
[facility] EMERGENCY DEPARTMENT Provider Note   CSN: [id number] Arrival date & time: 04/18/18  1131     History   Chief Complaint Chief Complaint  Patient presents with  . Altered Mental Status    HPI Lynn Nichols is a 84 y.o. female.  Patient was brought in by her daughter because she had some confusion and slurred speech yesterday.  Patient is completely normal now.  The history is provided by the patient and a relative. No language interpreter was used.  Illness  This is a new problem. The current episode started yesterday. The problem occurs rarely. The problem has been resolved. Pertinent negatives include no chest pain, no abdominal pain and no headaches. Nothing aggravates the symptoms. Nothing relieves the symptoms. She has tried nothing for the symptoms. The treatment provided no relief.    Past Medical History:  Diagnosis Date  . Arthritis   . Back pain   . Diverticulosis   . Headache   . Hypertension   . Lower GI bleeding     Patient Active Problem List   Diagnosis Date Noted  . Lower GI bleeding 11/17/2017  . Rectal bleed 11/17/2017  . Elevated troponin 11/17/2017  . Hypokalemia 11/17/2017  . Palliative care encounter   . Goals of care, counseling/discussion   . Encounter for hospice care discussion   . Lower GI bleed 04/22/2017  . Hypertension 04/22/2017  . Diverticulosis 04/22/2017  . Acute blood loss anemia 07/05/2014  . Acute lower gastrointestinal bleeding 06/30/2014  . Essential hypertension 06/30/2014  . Acute lower GI bleeding 06/30/2014    Past Surgical History:  Procedure Laterality Date  . ABDOMINAL HYSTERECTOMY    . CHOLECYSTECTOMY    . COLONOSCOPY N/A 07/01/2014   Procedure: COLONOSCOPY;  Surgeon: Malissa Hippo, MD;  [facility];  Service: Endoscopy;  Laterality: N/A;  . COLONOSCOPY N/A 07/05/2014   Procedure: COLONOSCOPY;  Surgeon: Malissa Hippo, MD;  Location: [facility];  Service: Endoscopy;  Laterality:  N/A;     OB History    Gravida  14   Para  13   Term  13   Preterm      AB  1   Living  6     SAB  1   TAB      Ectopic      Multiple      Live Births               Home Medications    Prior to Admission medications   Medication Sig Start Date End Date Taking? Authorizing Provider  albuterol (PROAIR HFA) 108 (90 BASE) MCG/ACT inhaler Inhale 2 puffs into the lungs every 6 (six) hours as needed. For shortness of breath   Yes [provider]  Camphor-Eucalyptus-Menthol (VICKS VAPORUB EX) Apply 1 application topically daily as needed (pain).   Yes [provider]  clobetasol ointment (TEMOVATE) 0.05 %  12/28/17  Yes [provider]  clonazePAM (KLONOPIN) 0.5 MG tablet Take 0.5 mg by mouth 3 (three) times daily as needed.  02/14/18  Yes [provider]  desonide (DESOWEN) 0.05 % ointment  12/28/17  Yes [provider]  ferrous sulfate 325 (65 FE) MG EC tablet Take 1 tablet (325 mg total) by mouth 2 (two) times daily with a meal. Patient taking differently: Take 325 mg by mouth daily.  04/24/17  Yes Emokpae, Courage, MD  latanoprost (XALATAN) 0.005 % ophthalmic solution Place 1 drop into both eyes at  bedtime.  09/26/15  Yes [provider]  NIFEdipine (PROCARDIA-XL/ADALAT CC) 30 MG 24 hr tablet Take 1 tablet by mouth daily. 08/28/17  Yes [provider]  senna-docusate (SENOKOT-S) 8.6-50 MG tablet Take 2 tablets by mouth daily as needed for mild constipation. 11/19/17 11/19/18 Yes Paar Cloud, MD  traMADol (ULTRAM) 50 MG tablet Take 50 mg by mouth 3 (three) times daily as needed for moderate pain.    Yes [provider]  Vitamin D, Ergocalciferol, (DRISDOL) 50000 units CAPS capsule Take 50,000 Units by mouth every 7 (seven) days.   Yes [provider]  potassium chloride (K-DUR) 10 MEQ tablet Take 1 tablet (10 mEq total) by mouth daily. 04/18/18   Bethann Berkshire, MD    Family  History Family History  Problem Relation Age of Onset  . Hypertension Mother   . Hypertension Father   . Diabetes Other     Social History Social History   Tobacco Use  . Smoking status: Never Smoker  . Smokeless tobacco: Current User    Types: Snuff  Substance Use Topics  . Alcohol use: No    Alcohol/week: 0.0 oz  . Drug use: No     Allergies   Aspirin and Penicillins   Review of Systems Review of Systems  Constitutional: Negative for appetite change and fatigue.  HENT: Negative for congestion, ear discharge and sinus pressure.   Eyes: Negative for discharge.  Respiratory: Negative for cough.   Cardiovascular: Negative for chest pain.  Gastrointestinal: Negative for abdominal pain and diarrhea.  Genitourinary: Negative for frequency and hematuria.  Musculoskeletal: Negative for back pain.  Skin: Negative for rash.  Neurological: Negative for seizures and headaches.       Some confusion  Psychiatric/Behavioral: Negative for hallucinations.     Physical Exam Updated Vital Signs BP (!) 158/78 (BP Location: Left Arm)   Pulse 85   Temp 98.9 F (37.2 C) (Oral)   Resp (!) 22   Ht  (1.626 m)   Wt 59 kg (130 lb)   SpO2 99%   BMI 22.31 kg/m   Physical Exam  Constitutional: She is oriented to person, place, and time. She appears well-developed.  HENT:  Head: Normocephalic.  Eyes: Conjunctivae and EOM are normal. No scleral icterus.  Neck: Neck supple. No thyromegaly present.  Cardiovascular: Normal rate and regular rhythm. Exam reveals no gallop and no friction rub.  No murmur heard. Pulmonary/Chest: No stridor. She has no wheezes. She has no rales. She exhibits no tenderness.  Abdominal: She exhibits no distension. There is no tenderness. There is no rebound.  Musculoskeletal: Normal range of motion. She exhibits no edema.  Lymphadenopathy:    She has no cervical adenopathy.  Neurological: She is oriented to person, place, and time. She exhibits normal  muscle tone. Coordination normal.  Skin: No rash noted. No erythema.  Psychiatric: She has a normal mood and affect. Her behavior is normal.     ED Treatments / Results  Labs (all labs ordered are listed, but only abnormal results are displayed) Labs Reviewed  COMPREHENSIVE METABOLIC PANEL - Abnormal; Notable for the following components:      Result Value   Potassium 2.7 (*)    Chloride 99 (*)    Glucose, Bld 139 (*)    Calcium 8.8 (*)    ALT 12 (*)    GFR calc non Af Amer 56 (*)    All other components within normal limits  URINALYSIS, ROUTINE W REFLEX  MICROSCOPIC - Abnormal; Notable for the following components:   Protein, ur 30 (*)    Bacteria, UA RARE (*)    All other components within normal limits  CBG MONITORING, ED - Abnormal; Notable for the following components:   Glucose-Capillary 122 (*)    All other components within normal limits  CBC    EKG None  Radiology Ct Head Wo Contrast  Result Date: 04/18/2018 CLINICAL DATA:  Confusion and slurred speech for 1 day. EXAM: CT HEAD WITHOUT CONTRAST TECHNIQUE: Contiguous axial images were obtained from the base of the skull through the vertex without intravenous contrast. COMPARISON:  None. FINDINGS: Brain: No mass lesion, intraparenchymal hemorrhage or extra-axial collection. No evidence of acute cortical infarct. Periventricular hypoattenuation suggesting chronic microvascular disease. Vascular: No hyperdense vessel or unexpected vascular calcification. Skull: Normal visualized skull base, calvarium and extracranial soft tissues. Sinuses/Orbits: No sinus fluid levels or advanced mucosal thickening. No mastoid effusion. Normal orbits. IMPRESSION: Mild chronic small vessel ischemic changes for age. No acute abnormality. Electronically Signed   By: Deatra Robinson M.D.   On: 04/18/2018 14:29    Procedures Procedures (including critical care time)  Medications Ordered in ED Medications  potassium chloride SA (K-DUR,KLOR-CON)  CR tablet 40 mEq (40 mEq Oral Given 04/18/18 1357)     Initial Impression / Assessment and Plan / ED Course  I have reviewed the triage vital signs and the nursing notes.  Pertinent labs & imaging results that were available during my care of the patient were reviewed by me and considered in my medical decision making (see chart for details).     Labs including CBC chemistries urinalysis and CT of the head are unremarkable except for hypokalemia.  Patient's symptoms of confusion never returned since yesterday.  She will be discharged home on potassium replacement and follow-up with her PCP Final Clinical Impressions(s) / ED Diagnoses   Final diagnoses:  Hypokalemia    ED Discharge Orders        Ordered    potassium chloride (K-DUR) 10 MEQ tablet  Daily     04/18/18 1553       Bethann Berkshire, MD 04/18/18 1600

## 2018-04-18 NOTE — ED Notes (Signed)
CRITICAL VALUE ALERT  Critical Value:  K 2.7  Date & Time Notied:  04/18/2018 Provider Notified: Dr. Estell Harpin, [id number]  Orders Received/Actions taken: see chart

## 2018-04-21 ENCOUNTER — Encounter (HOSPITAL_COMMUNITY): Payer: Self-pay | Admitting: Emergency Medicine

## 2018-04-21 ENCOUNTER — Emergency Department (HOSPITAL_COMMUNITY): Payer: Medicare Other

## 2018-04-21 ENCOUNTER — Other Ambulatory Visit: Payer: Self-pay

## 2018-04-21 ENCOUNTER — Emergency Department (HOSPITAL_COMMUNITY)
Admission: EM | Admit: 2018-04-21 | Discharge: 2018-04-21 | Disposition: A | Discharge status: Home / Self Care | Payer: Medicare Other | Attending: Emergency Medicine | Admitting: Emergency Medicine

## 2018-04-21 DIAGNOSIS — I639 Cerebral infarction, unspecified: Secondary | ICD-10-CM | POA: Diagnosis not present

## 2018-04-21 DIAGNOSIS — R51 Headache: Secondary | ICD-10-CM | POA: Diagnosis present

## 2018-04-21 DIAGNOSIS — I10 Essential (primary) hypertension: Secondary | ICD-10-CM | POA: Diagnosis not present

## 2018-04-21 DIAGNOSIS — Z79899 Other long term (current) drug therapy: Secondary | ICD-10-CM | POA: Diagnosis not present

## 2018-04-21 LAB — CBC WITH DIFFERENTIAL/PLATELET
BASOS ABS: 0 10*3/uL (ref 0.0–0.1)
BASOS PCT: 0 %
EOS ABS: 0.3 10*3/uL (ref 0.0–0.7)
EOS PCT: 4 %
HCT: 43.5 % (ref 36.0–46.0)
Hemoglobin: 14 g/dL (ref 12.0–15.0)
Lymphocytes Relative: 32 %
Lymphs Abs: 2.2 10*3/uL (ref 0.7–4.0)
MCH: 28.5 pg (ref 26.0–34.0)
MCHC: 32.2 g/dL (ref 30.0–36.0)
MCV: 88.4 fL (ref 78.0–100.0)
Monocytes Absolute: 0.6 10*3/uL (ref 0.1–1.0)
Monocytes Relative: 8 %
Neutro Abs: 3.8 10*3/uL (ref 1.7–7.7)
Neutrophils Relative %: 56 %
PLATELETS: 171 10*3/uL (ref 150–400)
RBC: 4.92 MIL/uL (ref 3.87–5.11)
RDW: 14.3 % (ref 11.5–15.5)
WBC: 6.9 10*3/uL (ref 4.0–10.5)

## 2018-04-21 LAB — BASIC METABOLIC PANEL
Anion gap: 9 (ref 5–15)
BUN: 17 mg/dL (ref 6–20)
CALCIUM: 9.5 mg/dL (ref 8.9–10.3)
CHLORIDE: 102 mmol/L (ref 101–111)
CO2: 27 mmol/L (ref 22–32)
CREATININE: 1.06 mg/dL — AB (ref 0.44–1.00)
GFR calc Af Amer: 48 mL/min — ABNORMAL LOW (ref >60)
GFR calc non Af Amer: 41 mL/min — ABNORMAL LOW (ref >60)
Glucose, Bld: 126 mg/dL — ABNORMAL HIGH (ref 65–99)
POTASSIUM: 3.7 mmol/L (ref 3.5–5.1)
Sodium: 138 mmol/L (ref 135–145)

## 2018-04-21 NOTE — ED Provider Notes (Signed)
[facility] EMERGENCY DEPARTMENT Provider Note   CSN: [id number] Arrival date & time: 04/21/18  0055   History   Chief Complaint Chief Complaint  Patient presents with  . Headache    HPI Lynn Nichols is a 84 y.o. female.  Patient is a 84 year old female with past medical history of hypertension and arthritis.  She was brought by family members for evaluation of headache and slurred speech.  She has been having these issues for the past several days.  She was seen here 3 days ago and underwent CT scan, laboratory studies, and urinalysis.  The only obvious abnormality was a potassium of 2.7.  She was discharged to home.  This evening she began complaining of a worsening frontal headache that she describes as a throbbing.  She has had slurred speech with family members having difficulty comprehending what she is saying.  They state that this is new for her.  The patient denies other specific complaints such as pain, difficulty breathing, or other issues.  The history is provided by the patient and a relative (Daughter ).  Headache   This is a new problem. Episode onset: 3 days ago. The problem occurs constantly. The problem has been gradually worsening. The headache is associated with nothing. The pain is located in the frontal region. The quality of the pain is described as throbbing. The pain is moderate. The pain does not radiate. Pertinent negatives include no fever, no palpitations and no shortness of breath.    Past Medical History:  Diagnosis Date  . Arthritis   . Back pain   . Diverticulosis   . Headache   . Hypertension   . Lower GI bleeding     Patient Active Problem List   Diagnosis Date Noted  . Lower GI bleeding 11/17/2017  . Rectal bleed 11/17/2017  . Elevated troponin 11/17/2017  . Hypokalemia 11/17/2017  . Palliative care encounter   . Goals of care, counseling/discussion   . Encounter for hospice care discussion   . Lower GI bleed 04/22/2017  .  Hypertension 04/22/2017  . Diverticulosis 04/22/2017  . Acute blood loss anemia 07/05/2014  . Acute lower gastrointestinal bleeding 06/30/2014  . Essential hypertension 06/30/2014  . Acute lower GI bleeding 06/30/2014    Past Surgical History:  Procedure Laterality Date  . ABDOMINAL HYSTERECTOMY    . CHOLECYSTECTOMY    . COLONOSCOPY N/A 07/01/2014   Procedure: COLONOSCOPY;  Surgeon: Malissa Hippo, MD;  Location[facility];  Service: Endoscopy;  Laterality: N/A;  . COLONOSCOPY N/A 07/05/2014   Procedure: COLONOSCOPY;  Surgeon: Malissa Hippo, MD;  Location: [facility];  Service: Endoscopy;  Laterality: N/A;     OB History    Gravida  14   Para  13   Term  13   Preterm      AB  1   Living  6     SAB  1   TAB      Ectopic      Multiple      Live Births               Home Medications    Prior to Admission medications   Medication Sig Start Date End Date Taking? Authorizing Provider  albuterol (PROAIR HFA) 108 (90 BASE) MCG/ACT inhaler Inhale 2 puffs into the lungs every 6 (six) hours as needed. For shortness of breath    [provider]  Camphor-Eucalyptus-Menthol (VICKS VAPORUB EX) Apply 1 application topically daily  as needed (pain).    [provider]  clobetasol ointment (TEMOVATE) 0.05 %  12/28/17   [provider]  clonazePAM (KLONOPIN) 0.5 MG tablet Take 0.5 mg by mouth 3 (three) times daily as needed.  02/14/18   [provider]  desonide (DESOWEN) 0.05 % ointment  12/28/17   [provider]  ferrous sulfate 325 (65 FE) MG EC tablet Take 1 tablet (325 mg total) by mouth 2 (two) times daily with a meal. Patient taking differently: Take 325 mg by mouth daily.  04/24/17   Emokpae, Courage, MD  latanoprost (XALATAN) 0.005 % ophthalmic solution Place 1 drop into both eyes at bedtime.  09/26/15   [provider]  NIFEdipine (PROCARDIA-XL/ADALAT CC) 30 MG 24 hr tablet Take 1 tablet by mouth daily. 08/28/17    [provider]  potassium chloride (K-DUR) 10 MEQ tablet Take 1 tablet (10 mEq total) by mouth daily. 04/18/18   Bethann Berkshire, MD  senna-docusate (SENOKOT-S) 8.6-50 MG tablet Take 2 tablets by mouth daily as needed for mild constipation. 11/19/17 11/19/18  Philip Aspen, Limmie Patricia, MD  traMADol (ULTRAM) 50 MG tablet Take 50 mg by mouth 3 (three) times daily as needed for moderate pain.     [provider]  Vitamin D, Ergocalciferol, (DRISDOL) 50000 units CAPS capsule Take 50,000 Units by mouth every 7 (seven) days.    [provider]    Family History Family History  Problem Relation Age of Onset  . Hypertension Mother   . Hypertension Father   . Diabetes Other     Social History Social History   Tobacco Use  . Smoking status: Never Smoker  . Smokeless tobacco: Current User    Types: Snuff  Substance Use Topics  . Alcohol use: No    Alcohol/week: 0.0 oz  . Drug use: No     Allergies   Aspirin and Penicillins   Review of Systems Review of Systems  Constitutional: Negative for fever.  Respiratory: Negative for shortness of breath.   Cardiovascular: Negative for palpitations.  Neurological: Positive for headaches.  All other systems reviewed and are negative.    Physical Exam Updated Vital Signs BP (!) 216/94 (BP Location: Right Arm)   Pulse 89   Temp 97.8 F (36.6 C) (Oral)   Resp 19   Ht  (1.626 m)   Wt 59 kg (130 lb)   SpO2 98%   BMI 22.31 kg/m   Physical Exam  Constitutional: She is oriented to person, place, and time. She appears well-developed and well-nourished. No distress.  HENT:  Head: Normocephalic and atraumatic.  Neck: Normal range of motion. Neck supple.  Cardiovascular: Normal rate and regular rhythm. Exam reveals no gallop and no friction rub.  No murmur heard. Pulmonary/Chest: Effort normal and breath sounds normal. No respiratory distress. She has no wheezes.  Abdominal: Soft. Bowel sounds are normal.  She exhibits no distension. There is no tenderness.  Musculoskeletal: Normal range of motion.  Neurological: She is alert and oriented to person, place, and time. She has normal strength. She is not disoriented. No cranial nerve deficit.  Skin: Skin is warm and dry. She is not diaphoretic.  Nursing note and vitals reviewed.    ED Treatments / Results  Labs (all labs ordered are listed, but only abnormal results are displayed) Labs Reviewed  BASIC METABOLIC PANEL  CBC WITH DIFFERENTIAL/PLATELET    EKG None  Radiology No results found.  Procedures Procedures (including critical care time)  Medications Ordered in ED Medications - No data to display   Initial Impression / Assessment and Plan / ED Course  I have reviewed the triage vital signs and the nursing notes.  Pertinent labs & imaging results that were available during my care of the patient were reviewed by me and considered in my medical decision making (see chart for details).  Patient is a 84 year old female brought for headache and slurred speech.  She was seen for similar complaints 3 days ago where work-up was unremarkable.  She returns with ongoing symptoms.  Her work-up today reveals the development of a focal hypodensity within the left parietal and posterior temporal lobes consistent with an acute to subacute infarct.  This is new from prior studies.  It appears as though she has had a small stroke.  I have discussed this with the patient and family.  None of them are interested in the patient undergoing further testing or change in therapy due to her advanced age, they are in agreement that she should just be kept comfortable and are willing to return her home.  She will be discharged, to return as needed.  She had been on aspirin in the past, however developed rectal bleeding and has been off this for the past month.  They are not interested in her starting this medication back up.  Final Clinical  Impressions(s) / ED Diagnoses   Final diagnoses:  None    ED Discharge Orders    None       Geoffery Lyons, MD 04/21/18 [phone]

## 2018-04-21 NOTE — ED Triage Notes (Signed)
Family states pt was seen here on Monday for same thing and was diagnosed with having low potassium. Pt today c/o headache. Family states they cant understand what shes saying which started on Sunday.

## 2018-04-21 NOTE — Discharge Instructions (Addendum)
Follow-up with your primary doctor in the next week, and return to the ER if symptoms significantly worsen or change. °

## 2018-04-30 DIAGNOSIS — I639 Cerebral infarction, unspecified: Secondary | ICD-10-CM

## 2018-04-30 HISTORY — DX: Cerebral infarction, unspecified: I63.9

## 2018-05-02 ENCOUNTER — Other Ambulatory Visit: Payer: Self-pay

## 2018-05-02 ENCOUNTER — Emergency Department (HOSPITAL_COMMUNITY)
Admission: EM | Admit: 2018-05-02 | Discharge: 2018-05-02 | Disposition: A | Discharge status: Home / Self Care | Payer: Medicare Other | Attending: Emergency Medicine | Admitting: Emergency Medicine

## 2018-05-02 ENCOUNTER — Encounter (HOSPITAL_COMMUNITY): Payer: Self-pay | Admitting: Emergency Medicine

## 2018-05-02 DIAGNOSIS — I10 Essential (primary) hypertension: Secondary | ICD-10-CM | POA: Insufficient documentation

## 2018-05-02 DIAGNOSIS — Z79899 Other long term (current) drug therapy: Secondary | ICD-10-CM | POA: Diagnosis not present

## 2018-05-02 DIAGNOSIS — E876 Hypokalemia: Secondary | ICD-10-CM | POA: Diagnosis not present

## 2018-05-02 DIAGNOSIS — R51 Headache: Secondary | ICD-10-CM | POA: Diagnosis present

## 2018-05-02 LAB — I-STAT CHEM 8, ED
BUN: 13 mg/dL (ref 6–20)
CHLORIDE: 100 mmol/L — AB (ref 101–111)
CREATININE: 0.8 mg/dL (ref 0.44–1.00)
Calcium, Ion: 1.17 mmol/L (ref 1.15–1.40)
Glucose, Bld: 150 mg/dL — ABNORMAL HIGH (ref 65–99)
HEMATOCRIT: 37 % (ref 36.0–46.0)
HEMOGLOBIN: 12.6 g/dL (ref 12.0–15.0)
POTASSIUM: 3.2 mmol/L — AB (ref 3.5–5.1)
Sodium: 139 mmol/L (ref 135–145)
TCO2: 26 mmol/L (ref 22–32)

## 2018-05-02 MED ORDER — POTASSIUM CHLORIDE CRYS ER 20 MEQ PO TBCR
40.0000 meq | EXTENDED_RELEASE_TABLET | Freq: Once | ORAL | Status: AC
  Administered 2018-05-02: 40 meq via ORAL
  Filled 2018-05-02: qty 2

## 2018-05-02 NOTE — ED Provider Notes (Signed)
[facility] EMERGENCY DEPARTMENT Provider Note   CSN: [id number] Arrival date & time: 05/02/18  1903     History   Chief Complaint Chief Complaint  Patient presents with  . Headache    HPI Lynn Nichols is a 84 y.o. female.   Patient complains of a headache today but now the headache is better.  The history is provided by the patient. No language interpreter was used.  Headache   This is a recurrent problem. The current episode started 3 to 5 hours ago. The problem occurs constantly. The problem has not changed since onset.The headache is associated with nothing. The pain is located in the right unilateral and temporal region. The quality of the pain is described as dull. The pain is at a severity of 6/10. The pain is moderate. The pain does not radiate.    Past Medical History:  Diagnosis Date  . Arthritis   . Back pain   . Diverticulosis   . Headache   . Hypertension   . Lower GI bleeding     Patient Active Problem List   Diagnosis Date Noted  . Lower GI bleeding 11/17/2017  . Rectal bleed 11/17/2017  . Elevated troponin 11/17/2017  . Hypokalemia 11/17/2017  . Palliative care encounter   . Goals of care, counseling/discussion   . Encounter for hospice care discussion   . Lower GI bleed 04/22/2017  . Hypertension 04/22/2017  . Diverticulosis 04/22/2017  . Acute blood loss anemia 07/05/2014  . Acute lower gastrointestinal bleeding 06/30/2014  . Essential hypertension 06/30/2014  . Acute lower GI bleeding 06/30/2014    Past Surgical History:  Procedure Laterality Date  . ABDOMINAL HYSTERECTOMY    . CHOLECYSTECTOMY    . COLONOSCOPY N/A 07/01/2014   Procedure: COLONOSCOPY;  Surgeon: Malissa Hippo, MD;  [facility];  Service: Endoscopy;  Laterality: N/A;  . COLONOSCOPY N/A 07/05/2014   Procedure: COLONOSCOPY;  Surgeon: Malissa Hippo, MD;  Location: [facility];  Service: Endoscopy;  Laterality: N/A;     OB History    Gravida  14   Para    13   Term  13   Preterm      AB  1   Living  6     SAB  1   TAB      Ectopic      Multiple      Live Births               Home Medications    Prior to Admission medications   Medication Sig Start Date End Date Taking? Authorizing Provider  albuterol (PROAIR HFA) 108 (90 BASE) MCG/ACT inhaler Inhale 2 puffs into the lungs every 6 (six) hours as needed. For shortness of breath    [provider]  Camphor-Eucalyptus-Menthol (VICKS VAPORUB EX) Apply 1 application topically daily as needed (pain).    [provider]  clobetasol ointment (TEMOVATE) 0.05 %  12/28/17   [provider]  clonazePAM (KLONOPIN) 0.5 MG tablet Take 0.5 mg by mouth 3 (three) times daily as needed.  02/14/18   [provider]  desonide (DESOWEN) 0.05 % ointment  12/28/17   [provider]  ferrous sulfate 325 (65 FE) MG EC tablet Take 1 tablet (325 mg total) by mouth 2 (two) times daily with a meal. Patient taking differently: Take 325 mg by mouth daily.  04/24/17   Shon Hale, MD  latanoprost (XALATAN) 0.005 % ophthalmic solution Place 1 drop  into both eyes at bedtime.  09/26/15   [provider]  NIFEdipine (PROCARDIA-XL/ADALAT CC) 30 MG 24 hr tablet Take 1 tablet by mouth daily. 08/28/17   [provider]  potassium chloride (K-DUR) 10 MEQ tablet Take 1 tablet (10 mEq total) by mouth daily. 04/18/18   Bethann BerkshireZammit, Takeisha Cianci, MD  senna-docusate (SENOKOT-S) 8.6-50 MG tablet Take 2 tablets by mouth daily as needed for mild constipation. 11/19/17 11/19/18  Philip AspenHernandez Acosta, Limmie PatriciaEstela Y, MD  traMADol (ULTRAM) 50 MG tablet Take 50 mg by mouth 3 (three) times daily as needed for moderate pain.     [provider]  Vitamin D, Ergocalciferol, (DRISDOL) 50000 units CAPS capsule Take 50,000 Units by mouth every 7 (seven) days.    [provider]    Family History Family History  Problem Relation Age of Onset  . Hypertension Mother   .  Hypertension Father   . Diabetes Other     Social History Social History   Tobacco Use  . Smoking status: Never Smoker  . Smokeless tobacco: Current User    Types: Snuff  Substance Use Topics  . Alcohol use: No    Alcohol/week: 0.0 oz  . Drug use: No     Allergies   Aspirin and Penicillins   Review of Systems Review of Systems  Constitutional: Negative for appetite change and fatigue.  HENT: Negative for congestion, ear discharge and sinus pressure.   Eyes: Negative for discharge.  Respiratory: Negative for cough.   Cardiovascular: Negative for chest pain.  Gastrointestinal: Negative for abdominal pain and diarrhea.  Genitourinary: Negative for frequency and hematuria.  Musculoskeletal: Negative for back pain.  Skin: Negative for rash.  Neurological: Positive for headaches. Negative for seizures.  Psychiatric/Behavioral: Negative for hallucinations.     Physical Exam Updated Vital Signs BP (!) 214/73   Pulse 76   Temp 97.9 F (36.6 C) (Oral)   Resp 18   Ht 5\' 4"  (1.626 m)   Wt 59 kg (130 lb)   SpO2 97%   BMI 22.31 kg/m   Physical Exam  Constitutional: She is oriented to person, place, and time. She appears well-developed.  HENT:  Head: Normocephalic.  Eyes: Conjunctivae and EOM are normal. No scleral icterus.  Neck: Neck supple. No thyromegaly present.  Cardiovascular: Normal rate and regular rhythm. Exam reveals no gallop and no friction rub.  No murmur heard. Pulmonary/Chest: No stridor. She has no wheezes. She has no rales. She exhibits no tenderness.  Abdominal: She exhibits no distension. There is no tenderness. There is no rebound.  Musculoskeletal: Normal range of motion. She exhibits no edema.  Lymphadenopathy:    She has no cervical adenopathy.  Neurological: She is oriented to person, place, and time. She exhibits normal muscle tone. Coordination normal.  Skin: No rash noted. No erythema.  Psychiatric: She has a normal mood and affect. Her  behavior is normal.     ED Treatments / Results  Labs (all labs ordered are listed, but only abnormal results are displayed) Labs Reviewed  I-STAT CHEM 8, ED - Abnormal; Notable for the following components:      Result Value   Potassium 3.2 (*)    Chloride 100 (*)    Glucose, Bld 150 (*)    All other components within normal limits    EKG None  Radiology No results found.  Procedures Procedures (including critical care time)  Medications Ordered in ED Medications  potassium chloride SA (K-DUR,KLOR-CON) CR tablet 40 mEq (has no  administration in time range)     Initial Impression / Assessment and Plan / ED Course  I have reviewed the triage vital signs and the nursing notes.  Pertinent labs & imaging results that were available during my care of the patient were reviewed by me and considered in my medical decision making (see chart for details).    Follow-up with your family doctor if needed  Final Clinical Impressions(s) / ED Diagnoses   Final diagnoses:  Hypokalemia    ED Discharge Orders    None       Bethann Berkshire, MD 05/02/18 2116

## 2018-05-02 NOTE — ED Triage Notes (Signed)
Headache x 2 weeks.  Has taken tramadol with no relief.

## 2018-05-02 NOTE — Discharge Instructions (Addendum)
Follow-up with your doctor if needed °

## 2018-05-02 NOTE — ED Notes (Signed)
Pt reports taking BP meds today.

## 2018-06-02 ENCOUNTER — Encounter (HOSPITAL_COMMUNITY): Payer: Self-pay

## 2018-06-02 ENCOUNTER — Other Ambulatory Visit: Payer: Self-pay

## 2018-06-02 ENCOUNTER — Emergency Department (HOSPITAL_COMMUNITY)
Admission: EM | Admit: 2018-06-02 | Discharge: 2018-06-02 | Disposition: A | Discharge status: Home / Self Care | Payer: Medicare Other | Attending: Emergency Medicine | Admitting: Emergency Medicine

## 2018-06-02 DIAGNOSIS — R21 Rash and other nonspecific skin eruption: Secondary | ICD-10-CM | POA: Insufficient documentation

## 2018-06-02 DIAGNOSIS — Z79899 Other long term (current) drug therapy: Secondary | ICD-10-CM | POA: Diagnosis not present

## 2018-06-02 DIAGNOSIS — I10 Essential (primary) hypertension: Secondary | ICD-10-CM | POA: Diagnosis not present

## 2018-06-02 DIAGNOSIS — Z9049 Acquired absence of other specified parts of digestive tract: Secondary | ICD-10-CM | POA: Insufficient documentation

## 2018-06-02 MED ORDER — TRIAMCINOLONE ACETONIDE 0.5 % EX OINT: TOPICAL_OINTMENT | Freq: Three times a day (TID) | CUTANEOUS | Status: DC

## 2018-06-02 MED ORDER — TRIAMCINOLONE ACETONIDE 0.1 % EX OINT
TOPICAL_OINTMENT | Freq: Once | CUTANEOUS | Status: AC
  Administered 2018-06-02: 1 via TOPICAL
  Filled 2018-06-02: qty 15

## 2018-06-02 MED ORDER — TRIAMCINOLONE ACETONIDE 0.1 % EX CREA: 1.0000 "application " | TOPICAL_CREAM | Freq: Three times a day (TID) | CUTANEOUS | 0 refills | Status: AC

## 2018-06-02 NOTE — ED Provider Notes (Signed)
[facility] EMERGENCY DEPARTMENT Provider Note   CSN: [id number] Arrival date & time: 06/02/18  1054     History   Chief Complaint Chief Complaint  Patient presents with  . Pruritis    HPI Lynn Nichols is a 84 y.o. female.  HPI  84 year old female, is in relatively good health given her age, she presents today with a rash which is on her bilateral neck, it is itchy, it is been going on for a week, coming and going, the family is unaware if there is been any new exposures but the patient denies anything.  She has no shortness of breath wheezing and denies any rashes anywhere else on her body.  She has been applying a and D ointment according to her caregiver family member who she lives with.  Her appetite is been okay and otherwise she has been in good health until this week when she started having the rash.  She has not been seen by her family doctor for this rash.  She does scratch the rash constantly.  Past Medical History:  Diagnosis Date  . Arthritis   . Back pain   . Diverticulosis   . Headache   . Hypertension   . Lower GI bleeding     Patient Active Problem List   Diagnosis Date Noted  . Lower GI bleeding 11/17/2017  . Rectal bleed 11/17/2017  . Elevated troponin 11/17/2017  . Hypokalemia 11/17/2017  . Palliative care encounter   . Goals of care, counseling/discussion   . Encounter for hospice care discussion   . Lower GI bleed 04/22/2017  . Hypertension 04/22/2017  . Diverticulosis 04/22/2017  . Acute blood loss anemia 07/05/2014  . Acute lower gastrointestinal bleeding 06/30/2014  . Essential hypertension 06/30/2014  . Acute lower GI bleeding 06/30/2014    Past Surgical History:  Procedure Laterality Date  . ABDOMINAL HYSTERECTOMY    . CHOLECYSTECTOMY    . COLONOSCOPY N/A 07/01/2014   Procedure: COLONOSCOPY;  Surgeon: Malissa Hippo, MD;  [facility];  Service: Endoscopy;  Laterality: N/A;  . COLONOSCOPY N/A 07/05/2014   Procedure:  COLONOSCOPY;  Surgeon: Malissa Hippo, MD;  [facility];  Service: Endoscopy;  Laterality: N/A;     OB History    Gravida  14   Para  13   Term  13   Preterm      AB  1   Living  6     SAB  1   TAB      Ectopic      Multiple      Live Births               Home Medications    Prior to Admission medications   Medication Sig Start Date End Date Taking? Authorizing Provider  albuterol (PROAIR HFA) 108 (90 BASE) MCG/ACT inhaler Inhale 2 puffs into the lungs every 6 (six) hours as needed. For shortness of breath   Yes [provider]  Camphor-Eucalyptus-Menthol (VICKS VAPORUB EX) Apply 1 application topically daily as needed (pain).   Yes [provider]  clonazePAM (KLONOPIN) 0.5 MG tablet Take 0.25 mg by mouth at bedtime as needed (sleep).  02/14/18  Yes [provider]  ferrous sulfate 325 (65 FE) MG EC tablet Take 1 tablet (325 mg total) by mouth 2 (two) times daily with a meal. Patient taking differently: Take 325 mg by mouth daily.  04/24/17  Yes Emokpae, Courage, MD  latanoprost (XALATAN) 0.005 %  ophthalmic solution Place 1 drop into both eyes at bedtime.  09/26/15  Yes [provider]  NIFEdipine (PROCARDIA-XL/ADALAT CC) 30 MG 24 hr tablet Take 1 tablet by mouth daily. 08/28/17  Yes [provider]  senna-docusate (SENOKOT-S) 8.6-50 MG tablet Take 2 tablets by mouth daily as needed for mild constipation. 11/19/17 11/19/18 Yes Tsutsui Cloud, MD  traMADol (ULTRAM) 50 MG tablet Take 50 mg by mouth 3 (three) times daily as needed for moderate pain.    Yes [provider]  Vitamin D, Ergocalciferol, (DRISDOL) 50000 units CAPS capsule Take 50,000 Units by mouth every 7 (seven) days. Takes on Sundays.   Yes [provider]  clobetasol ointment (TEMOVATE) 0.05 %  12/28/17   [provider]  desonide (DESOWEN) 0.05 % ointment  12/28/17   [provider]  potassium chloride  (K-DUR) 10 MEQ tablet Take 1 tablet (10 mEq total) by mouth daily. 04/18/18   Bethann Berkshire, MD    Family History Family History  Problem Relation Age of Onset  . Hypertension Mother   . Hypertension Father   . Diabetes Other     Social History Social History   Tobacco Use  . Smoking status: Never Smoker  . Smokeless tobacco: Current User    Types: Snuff  Substance Use Topics  . Alcohol use: No    Alcohol/week: 0.0 oz  . Drug use: No     Allergies   Aspirin and Penicillins   Review of Systems Review of Systems  Constitutional: Negative for fever.  Respiratory: Negative for cough.   Cardiovascular: Negative for chest pain and leg swelling.  Gastrointestinal: Negative for abdominal pain and vomiting.  Genitourinary: Negative for difficulty urinating.  Skin: Positive for rash.     Physical Exam Updated Vital Signs BP (!) 203/80   Pulse 89   Temp 99.1 F (37.3 C) (Oral)   Resp 16   Wt 59 kg (130 lb)   SpO2 97%   BMI 22.31 kg/m   Physical Exam  Constitutional: She appears well-developed and well-nourished. No distress.  HENT:  Head: Normocephalic and atraumatic.  Mouth/Throat: Oropharynx is clear and moist. No oropharyngeal exudate.  Eyes: Pupils are equal, round, and reactive to light. Conjunctivae and EOM are normal. Right eye exhibits no discharge. Left eye exhibits no discharge. No scleral icterus.  Neck: Normal range of motion. Neck supple. No JVD present. No thyromegaly present.  Cardiovascular: Normal rate, regular rhythm, normal heart sounds and intact distal pulses. Exam reveals no gallop and no friction rub.  No murmur heard. Pulmonary/Chest: Effort normal and breath sounds normal. No respiratory distress. She has no wheezes. She has no rales.  Abdominal: Soft. Bowel sounds are normal. She exhibits no distension and no mass. There is no tenderness.  Musculoskeletal: Normal range of motion. She exhibits no edema or tenderness.  Lymphadenopathy:     She has no cervical adenopathy.  Neurological: She is alert. Coordination normal.  Skin: Skin is warm and dry. Rash noted. No erythema.  There is a slightly erythematous and urticarial rash on the bilateral neck.  There is no petechiae purpura vesicles or pustules.  This is bilateral, it is not anywhere else on the body.  Psychiatric: She has a normal mood and affect. Her behavior is normal.  Nursing note and vitals reviewed.    ED Treatments / Results  Labs (all labs ordered are listed, but only abnormal results are displayed) Labs Reviewed - No data to display  EKG None  Radiology No results found.  Procedures Procedures (including critical care time)  Medications Ordered in ED Medications  triamcinolone ointment (KENALOG) 0.5 % (has no administration in time range)     Initial Impression / Assessment and Plan / ED Course  I have reviewed the triage vital signs and the nursing notes.  Pertinent labs & imaging results that were available during my care of the patient were reviewed by me and considered in my medical decision making (see chart for details).    The patient is not in any distress, her vital signs do reflect some hypertension but she has not yet had her medications this morning and she has significant whitecoat hypertension according to the family member.  At this point I feel comfortable letting the patient go home to take her home blood pressure medications and as far as the rash goes it seems to be some type of dermatitis.  I will have her avoid jewelry, topical medications or ointments or lotions or creams or soaps other than the triamcinolone which I will prescribe.  [facility].  Final Clinical Impressions(s) / ED Diagnoses   Final diagnoses:  Rash of neck  Essential hypertension    ED Discharge Orders    None       Eber HongMiller, Johnnay Pleitez, MD 06/02/18 1140

## 2018-06-02 NOTE — Discharge Instructions (Addendum)
Please apply the topical triamcinolone ointment 3 times daily to the neck.  You may take 12.5 mg of Benadryl as needed for itching but only use this 3 times a day.  Benadryl may cause sedation dry mouth or sometimes weakness or difficulty urinating.  Return to the emergency department for severe or worsening symptoms, [facility] within 3 days for a recheck if still having rash  Take your blood pressure medicine as soon as you get home  Please avoid any topical perfumes, lotions, jewelry which may be causing this.

## 2018-06-02 NOTE — ED Triage Notes (Signed)
Pt is complaining of itching to the left side of her neck as well as legs. States it's been going on for "awhile" Small rash noted to neck as well as on legs. Has not taken any benadryl PO or used any creams for itching.

## 2018-06-02 NOTE — ED Notes (Signed)
Pt does have a history of HTN. Pt has not taken BP medications today. BP 230/99 in triage. Taken twice for accuracy. Per family, usually runs 130 systolic

## 2018-06-22 ENCOUNTER — Emergency Department (HOSPITAL_COMMUNITY)
Admission: EM | Admit: 2018-06-22 | Discharge: 2018-06-23 | Disposition: A | Discharge status: Home / Self Care | Payer: Medicare Other | Attending: Emergency Medicine | Admitting: Emergency Medicine

## 2018-06-22 ENCOUNTER — Encounter (HOSPITAL_COMMUNITY): Payer: Self-pay | Admitting: *Deleted

## 2018-06-22 ENCOUNTER — Other Ambulatory Visit: Payer: Self-pay

## 2018-06-22 DIAGNOSIS — H1031 Unspecified acute conjunctivitis, right eye: Secondary | ICD-10-CM | POA: Insufficient documentation

## 2018-06-22 DIAGNOSIS — I10 Essential (primary) hypertension: Secondary | ICD-10-CM | POA: Diagnosis not present

## 2018-06-22 DIAGNOSIS — F17220 Nicotine dependence, chewing tobacco, uncomplicated: Secondary | ICD-10-CM | POA: Insufficient documentation

## 2018-06-22 DIAGNOSIS — H5789 Other specified disorders of eye and adnexa: Secondary | ICD-10-CM | POA: Diagnosis present

## 2018-06-22 DIAGNOSIS — Z79899 Other long term (current) drug therapy: Secondary | ICD-10-CM | POA: Insufficient documentation

## 2018-06-22 DIAGNOSIS — H1032 Unspecified acute conjunctivitis, left eye: Secondary | ICD-10-CM

## 2018-06-22 MED ORDER — TETRACAINE HCL 0.5 % OP SOLN
2.0000 [drp] | Freq: Once | OPHTHALMIC | Status: DC
  Filled 2018-06-22: qty 4

## 2018-06-22 MED ORDER — FLUORESCEIN SODIUM 1 MG OP STRP
1.0000 | ORAL_STRIP | Freq: Once | OPHTHALMIC | Status: DC
  Filled 2018-06-22: qty 1

## 2018-06-22 MED ORDER — POLYMYXIN B-TRIMETHOPRIM 10000-0.1 UNIT/ML-% OP SOLN: 1.0000 [drp] | OPHTHALMIC | 0 refills | Status: AC

## 2018-06-22 NOTE — ED Provider Notes (Signed)
[facility] EMERGENCY DEPARTMENT Provider Note   CSN: [id number] Arrival date & time: 06/22/18  2044     History   Chief Complaint Chief Complaint  Patient presents with  . Eye Drainage    HPI Lynn Nichols is a 84 y.o. female.  HPI  84 year old female presents with left eye drainage.  Has been ongoing for about a week.  No visual complaints.  No eye pain or headache.  It has become a little bit red and is draining a clear fluid.  No fevers or other symptoms such as cough or rhinorrhea.  Both of her eyes feel itchy. 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  Past Medical History:  Diagnosis Date  . Arthritis   . Back pain   . Diverticulosis   . Headache   . Hypertension   . Lower GI bleeding     Patient Active Problem List   Diagnosis Date Noted  . Lower GI bleeding 11/17/2017  . Rectal bleed 11/17/2017  . Elevated troponin 11/17/2017  . Hypokalemia 11/17/2017  . Palliative care encounter   . Goals of care, counseling/discussion   . Encounter for hospice care discussion   . Lower GI bleed 04/22/2017  . Hypertension 04/22/2017  . Diverticulosis 04/22/2017  . Acute blood loss anemia 07/05/2014  . Acute lower gastrointestinal bleeding 06/30/2014  . Essential hypertension 06/30/2014  . Acute lower GI bleeding 06/30/2014    Past Surgical History:  Procedure Laterality Date  . ABDOMINAL HYSTERECTOMY    . CHOLECYSTECTOMY    . COLONOSCOPY N/A 07/01/2014   Procedure: COLONOSCOPY;  Surgeon: Malissa Hippo, MD;  [facility];  Service: Endoscopy;  Laterality: N/A;  . COLONOSCOPY N/A 07/05/2014   Procedure: COLONOSCOPY;  Surgeon: Malissa Hippo, MD;  [facility];  Service: Endoscopy;  Laterality: N/A;     OB History    Gravida  14   Para  13   Term  13   Preterm      AB  1   Living  6     SAB  1   TAB      Ectopic      Multiple      Live Births               Home Medications    Prior to Admission medications     Medication Sig Start Date End Date Taking? Authorizing Provider  albuterol (PROAIR HFA) 108 (90 BASE) MCG/ACT inhaler Inhale 2 puffs into the lungs every 6 (six) hours as needed. For shortness of breath   Yes [provider]  Camphor-Eucalyptus-Menthol (VICKS VAPORUB EX) Apply 1 application topically daily as needed (pain).   Yes [provider]  clonazePAM (KLONOPIN) 0.5 MG tablet Take 0.25 mg by mouth at bedtime as needed (sleep).  02/14/18  Yes [provider]  ferrous sulfate 325 (65 FE) MG EC tablet Take 1 tablet (325 mg total) by mouth 2 (two) times daily with a meal. Patient taking differently: Take 325 mg by mouth daily.  04/24/17  Yes Emokpae, Courage, MD  latanoprost (XALATAN) 0.005 % ophthalmic solution Place 1 drop into both eyes at bedtime.  09/26/15  Yes [provider]  NIFEdipine (PROCARDIA-XL/ADALAT CC) 30 MG 24 hr tablet Take 1 tablet by mouth daily. 08/28/17  Yes [provider]  senna-docusate (SENOKOT-S) 8.6-50 MG tablet Take 2 tablets by mouth daily as needed for mild constipation. 11/19/17 11/19/18 Yes Feenstra Cloud, MD  traMADol Janean Sark)  50 MG tablet Take 25-50 mg by mouth 3 (three) times daily as needed for moderate pain.    Yes [provider]  Vitamin D, Ergocalciferol, (DRISDOL) 50000 units CAPS capsule Take 50,000 Units by mouth every 7 (seven) days. Takes on "Sundays.   Yes [provider]  trimethoprim-polymyxin b (POLYTRIM) ophthalmic solution Place 1 drop into the right eye every 3 (three) hours for 7 days. 06/22/18 06/29/18  Erion Weightman, MD    Family History Family History  Problem Relation Age of Onset  . Hypertension Mother   . Hypertension Father   . Diabetes Other     Social History Social History   Tobacco Use  . Smoking status: Never Smoker  . Smokeless tobacco: Current User    Types: Snuff  Substance Use Topics  . Alcohol use: No    Alcohol/week: 0.0 oz  . Drug use: No      Allergies   Aspirin and Penicillins   Review of Systems Review of Systems  Constitutional: Negative for fever.  HENT: Negative for rhinorrhea.   Eyes: Positive for pain, discharge, redness and itching. Negative for visual disturbance.  Neurological: Negative for headaches.     Physical Exam Updated Vital Signs BP (!) 198/64   Pulse 88   Temp 98.1 F (36.7 C) (Oral)   Resp 20   SpO2 100%   Physical Exam  Constitutional: She appears well-developed and well-nourished.  HENT:  Head: Normocephalic and atraumatic.  Right Ear: External ear normal.  Left Ear: External ear normal.  Nose: Nose normal.  Eyes: Pupils are equal, round, and reactive to light. EOM are normal. Right eye exhibits no discharge. Left eye exhibits discharge (mild green). Left conjunctiva is injected.  Slit lamp exam:      The left eye shows no corneal abrasion and no fluorescein uptake.  Pulmonary/Chest: Effort normal.  Abdominal: Soft.  Neurological: She is alert.  Skin: Skin is warm and dry. No erythema.  Nursing note and vitals reviewed.    ED Treatments / Results  Labs (all labs ordered are listed, but only abnormal results are displayed) Labs Reviewed - No data to display  EKG None  Radiology No results found.  Procedures Procedures (including critical care time)  Medications Ordered in ED Medications  fluorescein ophthalmic strip 1 strip (has no administration in time range)  tetracaine (PONTOCAINE) 0.5 % ophthalmic solution 2 drop (has no administration in time range)     Initial Impression / Assessment and Plan / ED Course  I have reviewed the triage vital signs and the nursing notes.  Pertinent labs & imaging results that were available during my care of the patient were reviewed by me and considered in my medical decision making (see chart for details).     The patient's exam and presentation is consistent with conjunctivitis.  There is no eye pain.  No floor seen  uptake.  Given the discharge, redness and itching I will treat for conjunctivitis with Polytrim.  Follow-up with ophthalmology.  No pain or headache to be concern for an intraocular problem.  Final Clinical Impressions(s) / ED Diagnoses   Final diagnoses:  Acute conjunctivitis of left eye, unspecified acute conjunctivitis type    ED Discharge Orders        Ordered    trimethoprim-polymyxin b (POLYTRIM) ophthalmic solution  Every  3 hours     07" /24/19 2359       Pricilla LovelessGoldston, Marck Mcclenny, MD 06/23/18 [phone]

## 2018-06-22 NOTE — ED Triage Notes (Signed)
Pt c/o drainage from left eye that started a week ago, pt denies any change in vision,

## 2018-07-25 ENCOUNTER — Other Ambulatory Visit: Payer: Self-pay

## 2018-07-25 ENCOUNTER — Encounter (HOSPITAL_COMMUNITY): Payer: Self-pay | Admitting: Emergency Medicine

## 2018-07-25 ENCOUNTER — Emergency Department (HOSPITAL_COMMUNITY): Payer: Medicare Other

## 2018-07-25 ENCOUNTER — Emergency Department (HOSPITAL_COMMUNITY)
Admission: EM | Admit: 2018-07-25 | Discharge: 2018-07-25 | Disposition: A | Discharge status: Home / Self Care | Payer: Medicare Other | Attending: Emergency Medicine | Admitting: Emergency Medicine

## 2018-07-25 DIAGNOSIS — R079 Chest pain, unspecified: Secondary | ICD-10-CM | POA: Diagnosis present

## 2018-07-25 DIAGNOSIS — E876 Hypokalemia: Secondary | ICD-10-CM | POA: Insufficient documentation

## 2018-07-25 DIAGNOSIS — I10 Essential (primary) hypertension: Secondary | ICD-10-CM | POA: Insufficient documentation

## 2018-07-25 DIAGNOSIS — Z79899 Other long term (current) drug therapy: Secondary | ICD-10-CM | POA: Diagnosis not present

## 2018-07-25 DIAGNOSIS — R0789 Other chest pain: Secondary | ICD-10-CM | POA: Insufficient documentation

## 2018-07-25 LAB — BASIC METABOLIC PANEL
ANION GAP: 10 (ref 5–15)
BUN: 19 mg/dL (ref 8–23)
CHLORIDE: 103 mmol/L (ref 98–111)
CO2: 28 mmol/L (ref 22–32)
CREATININE: 0.92 mg/dL (ref 0.44–1.00)
Calcium: 9.1 mg/dL (ref 8.9–10.3)
GFR calc non Af Amer: 49 mL/min — ABNORMAL LOW (ref >60)
GFR, EST AFRICAN AMERICAN: 56 mL/min — AB (ref >60)
Glucose, Bld: 168 mg/dL — ABNORMAL HIGH (ref 70–99)
Potassium: 2.8 mmol/L — ABNORMAL LOW (ref 3.5–5.1)
Sodium: 141 mmol/L (ref 135–145)

## 2018-07-25 LAB — CBC
HCT: 38.4 % (ref 36.0–46.0)
HEMOGLOBIN: 12.4 g/dL (ref 12.0–15.0)
MCH: 29.4 pg (ref 26.0–34.0)
MCHC: 32.3 g/dL (ref 30.0–36.0)
MCV: 91 fL (ref 78.0–100.0)
Platelets: 172 10*3/uL (ref 150–400)
RBC: 4.22 MIL/uL (ref 3.87–5.11)
RDW: 14.5 % (ref 11.5–15.5)
WBC: 6.8 10*3/uL (ref 4.0–10.5)

## 2018-07-25 LAB — TROPONIN I
Troponin I: 0.03 ng/mL (ref <0.03)
Troponin I: 0.03 ng/mL (ref <0.03)

## 2018-07-25 MED ORDER — POTASSIUM CHLORIDE CRYS ER 20 MEQ PO TBCR
40.0000 meq | EXTENDED_RELEASE_TABLET | Freq: Once | ORAL | Status: AC
  Administered 2018-07-25: 40 meq via ORAL
  Filled 2018-07-25: qty 2

## 2018-07-25 MED ORDER — POTASSIUM CHLORIDE 10 MEQ/100ML IV SOLN
10.0000 meq | Freq: Once | INTRAVENOUS | Status: AC
  Administered 2018-07-25: 10 meq via INTRAVENOUS
  Filled 2018-07-25: qty 100

## 2018-07-25 MED ORDER — POTASSIUM CHLORIDE ER 10 MEQ PO TBCR: 10.0000 meq | EXTENDED_RELEASE_TABLET | Freq: Every day | ORAL | 0 refills | Status: DC

## 2018-07-25 NOTE — ED Notes (Signed)
Attempted to increase rate on patient's K+, unable to tolerate.

## 2018-07-25 NOTE — ED Notes (Signed)
Patient c/o burning at IV site with admin of K+ IVPB. Rate slowed.

## 2018-07-25 NOTE — ED Triage Notes (Signed)
Cp on and off x 3 days to lt chest

## 2018-07-25 NOTE — ED Notes (Signed)
Unable to speed up K+ infusion. Patient c/o burning.

## 2018-07-25 NOTE — ED Notes (Signed)
Patient up and ambulatory to bathroom with assistance.

## 2018-07-25 NOTE — Discharge Instructions (Addendum)
Follow-up with your family doctor and the cardiologist within the week.  Return if any problems.

## 2018-07-25 NOTE — ED Provider Notes (Signed)
[facility] EMERGENCY DEPARTMENT Provider Note   CSN: [id number] Arrival date & time: 07/25/18  1307     History   Chief Complaint Chief Complaint  Patient presents with  . Chest Pain    HPI Lynn Nichols is a 84 y.o. female.  Patient has been having chest pain off and on for last week.  No pain now.  The history is provided by the patient. No language interpreter was used.  Chest Pain   This is a recurrent problem. The current episode started more than 2 days ago. The problem occurs rarely. The problem has been resolved. Associated with: Unknown. The pain is present in the substernal region. The pain is at a severity of 7/10. The pain is moderate. The quality of the pain is described as dull. The pain does not radiate. Pertinent negatives include no abdominal pain, no back pain, no cough and no headaches.  Pertinent negatives for past medical history include no seizures.    Past Medical History:  Diagnosis Date  . Arthritis   . Back pain   . Diverticulosis   . Headache   . Hypertension   . Lower GI bleeding     Patient Active Problem List   Diagnosis Date Noted  . Lower GI bleeding 11/17/2017  . Rectal bleed 11/17/2017  . Elevated troponin 11/17/2017  . Hypokalemia 11/17/2017  . Palliative care encounter   . Goals of care, counseling/discussion   . Encounter for hospice care discussion   . Lower GI bleed 04/22/2017  . Hypertension 04/22/2017  . Diverticulosis 04/22/2017  . Acute blood loss anemia 07/05/2014  . Acute lower gastrointestinal bleeding 06/30/2014  . Essential hypertension 06/30/2014  . Acute lower GI bleeding 06/30/2014    Past Surgical History:  Procedure Laterality Date  . ABDOMINAL HYSTERECTOMY    . CHOLECYSTECTOMY    . COLONOSCOPY N/A 07/01/2014   Procedure: COLONOSCOPY;  Surgeon: Malissa Hippo, MD;  [facility];  Service: Endoscopy;  Laterality: N/A;  . COLONOSCOPY N/A 07/05/2014   Procedure: COLONOSCOPY;  Surgeon: Malissa Hippo, MD;  Location: [facility];  Service: Endoscopy;  Laterality: N/A;     OB History    Gravida  14   Para  13   Term  13   Preterm      AB  1   Living  6     SAB  1   TAB      Ectopic      Multiple      Live Births               Home Medications    Prior to Admission medications   Medication Sig Start Date End Date Taking? Authorizing Provider  albuterol (PROAIR HFA) 108 (90 BASE) MCG/ACT inhaler Inhale 2 puffs into the lungs every 6 (six) hours as needed. For shortness of breath   Yes [provider]  Camphor-Eucalyptus-Menthol (VICKS VAPORUB EX) Apply 1 application topically daily as needed (pain).   Yes [provider]  clonazePAM (KLONOPIN) 0.5 MG tablet Take 0.25 mg by mouth at bedtime as needed (sleep).  02/14/18  Yes [provider]  ferrous sulfate 325 (65 FE) MG EC tablet Take 1 tablet (325 mg total) by mouth 2 (two) times daily with a meal. Patient taking differently: Take 325 mg by mouth daily.  04/24/17  Yes Emokpae, Courage, MD  NIFEdipine (PROCARDIA-XL/ADALAT CC) 30 MG 24 hr tablet Take 1 tablet by mouth daily. 08/28/17  Yes [provider]  senna-docusate (SENOKOT-S) 8.6-50 MG tablet Take 2 tablets by mouth daily as needed for mild constipation. 11/19/17 11/19/18 Yes Lipps Cloud, MD  sodium chloride (MURO 128) 2 % ophthalmic solution Place 1 drop into both eyes daily as needed for eye irritation.   Yes [provider]  traMADol (ULTRAM) 50 MG tablet Take 25-50 mg by mouth 3 (three) times daily as needed for moderate pain.    Yes [provider]  Vitamin D, Ergocalciferol, (DRISDOL) 50000 units CAPS capsule Take 50,000 Units by mouth every 7 (seven) days. Takes on Sundays.   Yes [provider]  potassium chloride (K-DUR) 10 MEQ tablet Take 1 tablet (10 mEq total) by mouth daily. 07/25/18   Bethann Berkshire, MD    Family History Family History  Problem Relation Age of  Onset  . Hypertension Mother   . Hypertension Father   . Diabetes Other     Social History Social History   Tobacco Use  . Smoking status: Never Smoker  . Smokeless tobacco: Current User    Types: Snuff  Substance Use Topics  . Alcohol use: No    Alcohol/week: 0.0 standard drinks  . Drug use: No     Allergies   Aspirin and Penicillins   Review of Systems Review of Systems  Constitutional: Negative for appetite change and fatigue.  HENT: Negative for congestion, ear discharge and sinus pressure.   Eyes: Negative for discharge.  Respiratory: Negative for cough.   Cardiovascular: Positive for chest pain.  Gastrointestinal: Negative for abdominal pain and diarrhea.  Genitourinary: Negative for frequency and hematuria.  Musculoskeletal: Negative for back pain.  Skin: Negative for rash.  Neurological: Negative for seizures and headaches.  Psychiatric/Behavioral: Negative for hallucinations.     Physical Exam Updated Vital Signs BP (!) 176/67   Pulse 67   Temp 97.9 F (36.6 C) (Oral)   Resp 19   Ht 5\' 5"  (1.651 m)   Wt 58.1 kg   SpO2 99%   BMI 21.30 kg/m   Physical Exam  Constitutional: She is oriented to person, place, and time. She appears well-developed.  HENT:  Head: Normocephalic.  Eyes: Conjunctivae and EOM are normal. No scleral icterus.  Neck: Neck supple. No thyromegaly present.  Cardiovascular: Normal rate and regular rhythm. Exam reveals no gallop and no friction rub.  No murmur heard. Pulmonary/Chest: No stridor. She has no wheezes. She has no rales. She exhibits no tenderness.  Abdominal: She exhibits no distension. There is no tenderness. There is no rebound.  Musculoskeletal: Normal range of motion. She exhibits no edema.  Lymphadenopathy:    She has no cervical adenopathy.  Neurological: She is oriented to person, place, and time. She exhibits normal muscle tone. Coordination normal.  Skin: No rash noted. No erythema.  Psychiatric: She has  a normal mood and affect. Her behavior is normal.     ED Treatments / Results  Labs (all labs ordered are listed, but only abnormal results are displayed) Labs Reviewed  BASIC METABOLIC PANEL - Abnormal; Notable for the following components:      Result Value   Potassium 2.8 (*)    Glucose, Bld 168 (*)    GFR calc non Af Amer 49 (*)    GFR calc Af Amer 56 (*)    All other components within normal limits  TROPONIN I - Abnormal; Notable for the following components:   Troponin I 0.03 (*)    All other components within  normal limits  TROPONIN I - Abnormal; Notable for the following components:   Troponin I 0.03 (*)    All other components within normal limits  CBC    EKG EKG Interpretation  Date/Time:  Monday July 25 2018 13:14:46 EDT Ventricular Rate:  82 PR Interval:    QRS Duration: 76 QT Interval:  362 QTC Calculation: 422 R Axis:   -55 Text Interpretation:  Sinus rhythm with marked sinus arrhythmia Pulmonary disease pattern Left anterior fascicular block Abnormal ECG Confirmed by Bethann BerkshireZammit, Ajwa Kimberley [phone]([id number]) on 07/25/2018 2:51:46 PM   Radiology Dg Chest 2 View  Result Date: 07/25/2018 CLINICAL DATA:  Three days of chest pain associated with nonproductive cough. Nonsmoker. EXAM: CHEST - 2 VIEW COMPARISON:  Chest x-ray of November 17, 2017 FINDINGS: The lungs are mildly hyperinflated with hemidiaphragm flattening. There is no focal infiltrate. The interstitial markings are mildly prominent. The heart and pulmonary vascularity are normal. There is calcification in the wall of the aortic arch and descending thoracic aorta. There is mild multilevel degenerative disc disease of the thoracic spine. IMPRESSION: Chronic bronchitic changes. No pneumonia, CHF, nor other acute cardiopulmonary abnormality. Thoracic aortic atherosclerosis. Electronically Signed   By: David  SwazilandJordan M.D.   On: 07/25/2018 13:44    Procedures Procedures (including critical care time)  Medications Ordered in  ED Medications  potassium chloride SA (K-DUR,KLOR-CON) CR tablet 40 mEq (40 mEq Oral Given 07/25/18 1534)  potassium chloride 10 mEq in 100 mL IVPB (0 mEq Intravenous Stopped 07/25/18 1905)  potassium chloride 10 mEq in 100 mL IVPB (10 mEq Intravenous New Bag/Given 07/25/18 1911)     Initial Impression / Assessment and Plan / ED Course  I have reviewed the triage vital signs and the nursing notes.  Pertinent labs & imaging results that were available during my care of the patient were reviewed by me and considered in my medical decision making (see chart for details).     Patient with periodic chest pain and troponin 0.03 EKG does not show any acute changes.  Patient not having pain now.  I spoke with cardiology and it was felt the patient could be discharged home with follow-up if her potassium is replaced and her troponin does not worsen.  Second troponin was again 0.03  Final Clinical Impressions(s) / ED Diagnoses   Final diagnoses:  Atypical chest pain  Hypokalemia    ED Discharge Orders         Ordered    potassium chloride (K-DUR) 10 MEQ tablet  Daily     07/25/18 2130           Bethann BerkshireZammit, Czarina Gingras, MD 07/25/18 2132

## 2018-07-25 NOTE — ED Notes (Signed)
Critical result of Trop 0.03  Given to Dr. Ranae PalmsYelverton.

## 2018-11-01 ENCOUNTER — Emergency Department (HOSPITAL_COMMUNITY)
Admission: EM | Admit: 2018-11-01 | Discharge: 2018-11-01 | Disposition: A | Discharge status: Home / Self Care | Payer: Medicare Other | Attending: Emergency Medicine | Admitting: Emergency Medicine

## 2018-11-01 ENCOUNTER — Emergency Department (HOSPITAL_COMMUNITY): Payer: Medicare Other

## 2018-11-01 ENCOUNTER — Encounter (HOSPITAL_COMMUNITY): Payer: Self-pay | Admitting: *Deleted

## 2018-11-01 ENCOUNTER — Other Ambulatory Visit: Payer: Self-pay

## 2018-11-01 DIAGNOSIS — W01190A Fall on same level from slipping, tripping and stumbling with subsequent striking against furniture, initial encounter: Secondary | ICD-10-CM | POA: Diagnosis not present

## 2018-11-01 DIAGNOSIS — Y999 Unspecified external cause status: Secondary | ICD-10-CM | POA: Diagnosis not present

## 2018-11-01 DIAGNOSIS — W19XXXA Unspecified fall, initial encounter: Secondary | ICD-10-CM

## 2018-11-01 DIAGNOSIS — S20212A Contusion of left front wall of thorax, initial encounter: Secondary | ICD-10-CM

## 2018-11-01 DIAGNOSIS — I10 Essential (primary) hypertension: Secondary | ICD-10-CM | POA: Insufficient documentation

## 2018-11-01 DIAGNOSIS — Y9389 Activity, other specified: Secondary | ICD-10-CM | POA: Diagnosis not present

## 2018-11-01 DIAGNOSIS — S20301A Unspecified superficial injuries of right front wall of thorax, initial encounter: Secondary | ICD-10-CM | POA: Insufficient documentation

## 2018-11-01 DIAGNOSIS — Z79899 Other long term (current) drug therapy: Secondary | ICD-10-CM | POA: Diagnosis not present

## 2018-11-01 DIAGNOSIS — Y92009 Unspecified place in unspecified non-institutional (private) residence as the place of occurrence of the external cause: Secondary | ICD-10-CM

## 2018-11-01 DIAGNOSIS — F17220 Nicotine dependence, chewing tobacco, uncomplicated: Secondary | ICD-10-CM | POA: Insufficient documentation

## 2018-11-01 DIAGNOSIS — M545 Low back pain, unspecified: Secondary | ICD-10-CM

## 2018-11-01 DIAGNOSIS — Y929 Unspecified place or not applicable: Secondary | ICD-10-CM | POA: Diagnosis not present

## 2018-11-01 DIAGNOSIS — S299XXA Unspecified injury of thorax, initial encounter: Secondary | ICD-10-CM | POA: Diagnosis present

## 2018-11-01 HISTORY — DX: Cerebral infarction, unspecified: I63.9

## 2018-11-01 NOTE — ED Triage Notes (Signed)
Pt reports she was in the bathroom this morning and her right leg "gave out" and caused her to fall. Pt reports she fell hitting the door with the right side of her abdomen and then fell straight down landing on her bottom. She c/o pain to fight side of abdomen/rib cage and "tailbone". Denies LOC upon fall. Denies use of blood thinners.

## 2018-11-01 NOTE — ED Provider Notes (Signed)
[facility] EMERGENCY DEPARTMENT Provider Note   CSN: [id number] Arrival date & time: 11/01/18  4098     History   Chief Complaint Chief Complaint  Patient presents with  . Fall    HPI Lynn Nichols is a 84 y.o. female.  HPI  Pt was seen at 0940. Per pt and her family, c/o sudden onset and resolution of one episode of fall that occurred this morning PTA. Pt states her "right knee gives out" which causes her to fall. Family states this has been "going on for a while." Pt states she fell to the right and backwards, landing on her buttocks/back. Pt states she hit her right ribs against a door. Pt was able to stand and ambulate after the fall. Denies LOC/syncope, no AMS, no focal motor weakness, no tingling/numbness in extremities, no prodromal symptoms before fall, no CP/palpitations, no SOB/cough, no abd pain, no N/V/D.     Past Medical History:  Diagnosis Date  . Arthritis   . Back pain   . Diverticulosis   . Headache   . Hypertension   . Lower GI bleeding   . Stroke [facility]) 04/2018   difficulty with vision in left eye from stroke    Patient Active Problem List   Diagnosis Date Noted  . Lower GI bleeding 11/17/2017  . Rectal bleed 11/17/2017  . Elevated troponin 11/17/2017  . Hypokalemia 11/17/2017  . Palliative care encounter   . Goals of care, counseling/discussion   . Encounter for hospice care discussion   . Lower GI bleed 04/22/2017  . Hypertension 04/22/2017  . Diverticulosis 04/22/2017  . Acute blood loss anemia 07/05/2014  . Acute lower gastrointestinal bleeding 06/30/2014  . Essential hypertension 06/30/2014  . Acute lower GI bleeding 06/30/2014    Past Surgical History:  Procedure Laterality Date  . ABDOMINAL HYSTERECTOMY    . CHOLECYSTECTOMY    . COLONOSCOPY N/A 07/01/2014   Procedure: COLONOSCOPY;  Surgeon: Malissa Hippo, MD;  [facility];  Service: Endoscopy;  Laterality: N/A;  . COLONOSCOPY N/A 07/05/2014   Procedure: COLONOSCOPY;   Surgeon: Malissa Hippo, MD;  Location: [facility];  Service: Endoscopy;  Laterality: N/A;     OB History    Gravida  14   Para  13   Term  13   Preterm      AB  1   Living  6     SAB  1   TAB      Ectopic      Multiple      Live Births               Home Medications    Prior to Admission medications   Medication Sig Start Date End Date Taking? Authorizing Provider  albuterol (PROAIR HFA) 108 (90 BASE) MCG/ACT inhaler Inhale 2 puffs into the lungs every 6 (six) hours as needed. For shortness of breath    [provider]  Camphor-Eucalyptus-Menthol (VICKS VAPORUB EX) Apply 1 application topically daily as needed (pain).    [provider]  clonazePAM (KLONOPIN) 0.5 MG tablet Take 0.25 mg by mouth at bedtime as needed (sleep).  02/14/18   [provider]  ferrous sulfate 325 (65 FE) MG EC tablet Take 1 tablet (325 mg total) by mouth 2 (two) times daily with a meal. Patient taking differently: Take 325 mg by mouth daily.  04/24/17   Shon Hale, MD  NIFEdipine (PROCARDIA-XL/ADALAT CC) 30 MG 24 hr tablet Take 1  tablet by mouth daily. 08/28/17   [provider]  potassium chloride (K-DUR) 10 MEQ tablet Take 1 tablet (10 mEq total) by mouth daily. 07/25/18   Bethann Berkshire, MD  senna-docusate (SENOKOT-S) 8.6-50 MG tablet Take 2 tablets by mouth daily as needed for mild constipation. 11/19/17 11/19/18  Philip Aspen, Limmie Patricia, MD  sodium chloride (MURO 128) 2 % ophthalmic solution Place 1 drop into both eyes daily as needed for eye irritation.    [provider]  traMADol (ULTRAM) 50 MG tablet Take 25-50 mg by mouth 3 (three) times daily as needed for moderate pain.     [provider]  Vitamin D, Ergocalciferol, (DRISDOL) 50000 units CAPS capsule Take 50,000 Units by mouth every 7 (seven) days. Takes on Sundays.    [provider]    Family History Family History  Problem Relation Age of Onset  .  Hypertension Mother   . Hypertension Father   . Diabetes Other     Social History Social History   Tobacco Use  . Smoking status: Never Smoker  . Smokeless tobacco: Current User    Types: Snuff  Substance Use Topics  . Alcohol use: No    Alcohol/week: 0.0 standard drinks  . Drug use: No     Allergies   Aspirin and Penicillins   Review of Systems Review of Systems ROS: Statement: All systems negative except as marked or noted in the HPI; Constitutional: Negative for fever and chills. ; ; Eyes: Negative for eye pain, redness and discharge. ; ; ENMT: Negative for ear pain, hoarseness, nasal congestion, sinus pressure and sore throat. ; ; Cardiovascular: Negative for chest pain, palpitations, diaphoresis, dyspnea and peripheral edema. ; ; Respiratory: Negative for cough, wheezing and stridor. ; ; Gastrointestinal: Negative for nausea, vomiting, diarrhea, abdominal pain, blood in stool, hematemesis, jaundice and rectal bleeding. . ; ; Genitourinary: Negative for dysuria, flank pain and hematuria. ; ; Musculoskeletal: +ribs pain, back pain. Negative for neck pain. Negative for swelling and deformity..; ; Skin: Negative for pruritus, rash, abrasions, blisters, bruising and skin lesion.; ; Neuro: Negative for headache, lightheadedness and neck stiffness. Negative for weakness, altered level of consciousness, altered mental status, extremity weakness, paresthesias, involuntary movement, seizure and syncope.       Physical Exam Updated Vital Signs BP (!) 216/83 (BP Location: Right Arm)   Pulse 71   Temp 97.7 F (36.5 C) (Oral)   Resp 16   Ht 5\' 4"  (1.626 m)   Wt 60.8 kg   SpO2 98%   BMI 23.00 kg/m   Physical Exam 0945: Physical examination: Vital signs and O2 SAT: Reviewed; Constitutional: Well developed, Well nourished, Well hydrated, In no acute distress; Head and Face: Normocephalic, Atraumatic; Eyes: EOMI, PERRL, No scleral icterus; ENMT: Mouth and pharynx normal, Mucous  membranes moist; Neck: Supple, Trachea midline. No abrasions or ecchymosis.; Spine: +mild right lumbar paraspinal muscles. No abrasions or ecchymosis. No midline CS, TS, LS tenderness.; Cardiovascular: Regular rate and rhythm, No gallop; Respiratory: Breath sounds clear & equal bilaterally, No wheezes, Normal respiratory effort/excursion; Chest: +TTP right lower anterior-lateral ribs. No deformity, Movement normal, No crepitus, No abrasions or ecchymosis.; Abdomen: Soft, Nontender, Nondistended, Normal bowel sounds, No abrasions or ecchymosis.; Genitourinary: No CVA tenderness;; Extremities: Pt is able to lift extended bilat LE's up off stretcher. NT right hip/knee/ankle/foot.  Full range of motion major/large joints of bilat UE's and LE's without pain or tenderness to palp, Neurovascularly intact, Pulses normal, No deformity. No tenderness, No edema,  Pelvis stable; Neuro: AA&Ox3.  Major CN grossly intact. Speech clear. Grips equal. Strength 5/5 equal bilat UE's and LE's. No gross focal motor or sensory deficits in extremities.; Skin: Color normal, Warm, Dry   ED Treatments / Results  Labs (all labs ordered are listed, but only abnormal results are displayed)   EKG None  Radiology   Procedures Procedures (including critical care time)  Medications Ordered in ED Medications - No data to display   Initial Impression / Assessment and Plan / ED Course  I have reviewed the triage vital signs and the nursing notes.  Pertinent labs & imaging results that were available during my care of the patient were reviewed by me and considered in my medical decision making (see chart for details).  MDM Reviewed: previous chart, nursing note and vitals Interpretation: x-ray and CT scan     Dg Ribs Unilateral W/chest Right Result Date: 11/01/2018 CLINICAL DATA:  Recent fall with right-sided chest pain, initial encounter EXAM: RIGHT RIBS AND CHEST - 3+ VIEW COMPARISON:  None. FINDINGS: Cardiac shadow is  mildly enlarged. Aortic calcifications are seen. The lungs are well aerated bilaterally without evidence of pneumothorax. No rib fractures are noted. Diffuse osteophytic changes are noted throughout the thoracic spine. IMPRESSION: No evidence of acute rib fracture. Electronically Signed   By: Alcide CleverMark  Lukens M.D.   On: 11/01/2018 11:06   Dg Lumbar Spine Complete Result Date: 11/01/2018 CLINICAL DATA:  Fall. EXAM: LUMBAR SPINE - COMPLETE 4+ VIEW COMPARISON:  CT 11/17/2017. FINDINGS: Diffuse osteopenia and severe multilevel degenerative change. Prominent scoliosis concave left. Stable 9 mm anterolisthesis L4-L5. No acute bony abnormality. Pelvic calcifications consistent phleboliths. Aortoiliac atherosclerotic vascular calcification. IMPRESSION: 1. Diffuse osteopenia and severe multilevel degenerative change. Prominent scoliosis concave left. Stable 9 mm anterolisthesis L4-L5. No acute bony abnormality. 2.  Aortoiliac atherosclerotic vascular disease. Electronically Signed   By: Maisie Fushomas  Register   On: 11/01/2018 11:09   Ct Head Wo Contrast Result Date: 11/01/2018 CLINICAL DATA:  84 year old female post fall. Denies loss of consciousness or hitting head. Initial encounter. EXAM: CT HEAD WITHOUT CONTRAST CT CERVICAL SPINE WITHOUT CONTRAST TECHNIQUE: Multidetector CT imaging of the head and cervical spine was performed following the standard protocol without intravenous contrast. Multiplanar CT image reconstructions of the cervical spine were also generated. COMPARISON:  04/21/2018 head CT. No comparison cervical spine CT. FINDINGS: CT HEAD FINDINGS Brain: No intracranial hemorrhage or CT evidence of large acute infarct. Prominent chronic microvascular changes. Remote left temporal lobe infarct. Global atrophy. No intracranial mass lesion noted on this unenhanced exam. Vascular: Vascular calcifications.  No acute hyperdense vessel. Skull: No skull fracture Sinuses/Orbits: Visualized orbits without acute abnormality.  Minimal mucosal thickening ethmoid sinus air cells. Other: Visualized mastoid air cells and middle ear cavities clear. CT CERVICAL SPINE FINDINGS Alignment: Slight curvature cervical spine. Skull base and vertebrae: No cervical spine fracture. Soft tissues and spinal canal: No abnormal prevertebral soft tissue swelling. Disc levels: Multilevel cervical spondylotic changes with spinal stenosis most notable C4-5 through C6-7. Transverse ligament hypertrophy. Upper chest: No worrisome lung apical lesion. Other: Nonspecific 3.3 cm cystic structure right lower neck impressing upon the posterior and lateral aspect of the right lobe of the thyroid gland. Carotid bifurcation calcifications. IMPRESSION: CT HEAD: 1. No skull fracture or intracranial hemorrhage. 2. Prominent chronic microvascular changes. Remote left temporal lobe infarct. 3. Global atrophy. CT CERVICAL SPINE: 1. No cervical spine fracture or abnormal prevertebral soft tissue swelling. 2. Multilevel cervical spondylotic changes with spinal stenosis most  notable C4-5 through C6-7. 3. Nonspecific 3.3 cm cystic structure right lower neck impressing upon the posterior and lateral aspect of the right lobe of the thyroid gland. 4. Carotid bifurcation calcifications. Electronically Signed   By: Lacy Duverney M.D.   On: 11/01/2018 10:49   Ct Cervical Spine Wo Contrast Result Date: 11/01/2018 CLINICAL DATA:  84 year old female post fall. Denies loss of consciousness or hitting head. Initial encounter. EXAM: CT HEAD WITHOUT CONTRAST CT CERVICAL SPINE WITHOUT CONTRAST TECHNIQUE: Multidetector CT imaging of the head and cervical spine was performed following the standard protocol without intravenous contrast. Multiplanar CT image reconstructions of the cervical spine were also generated. COMPARISON:  04/21/2018 head CT. No comparison cervical spine CT. FINDINGS: CT HEAD FINDINGS Brain: No intracranial hemorrhage or CT evidence of large acute infarct. Prominent chronic  microvascular changes. Remote left temporal lobe infarct. Global atrophy. No intracranial mass lesion noted on this unenhanced exam. Vascular: Vascular calcifications.  No acute hyperdense vessel. Skull: No skull fracture Sinuses/Orbits: Visualized orbits without acute abnormality. Minimal mucosal thickening ethmoid sinus air cells. Other: Visualized mastoid air cells and middle ear cavities clear. CT CERVICAL SPINE FINDINGS Alignment: Slight curvature cervical spine. Skull base and vertebrae: No cervical spine fracture. Soft tissues and spinal canal: No abnormal prevertebral soft tissue swelling. Disc levels: Multilevel cervical spondylotic changes with spinal stenosis most notable C4-5 through C6-7. Transverse ligament hypertrophy. Upper chest: No worrisome lung apical lesion. Other: Nonspecific 3.3 cm cystic structure right lower neck impressing upon the posterior and lateral aspect of the right lobe of the thyroid gland. Carotid bifurcation calcifications. IMPRESSION: CT HEAD: 1. No skull fracture or intracranial hemorrhage. 2. Prominent chronic microvascular changes. Remote left temporal lobe infarct. 3. Global atrophy. CT CERVICAL SPINE: 1. No cervical spine fracture or abnormal prevertebral soft tissue swelling. 2. Multilevel cervical spondylotic changes with spinal stenosis most notable C4-5 through C6-7. 3. Nonspecific 3.3 cm cystic structure right lower neck impressing upon the posterior and lateral aspect of the right lobe of the thyroid gland. 4. Carotid bifurcation calcifications. Electronically Signed   By: Lacy Duverney M.D.   On: 11/01/2018 10:49   Dg Knee Complete 4 Views Right Result Date: 11/01/2018 CLINICAL DATA:  Fall. EXAM: RIGHT KNEE - COMPLETE 4+ VIEW COMPARISON:  Right knee x-rays dated September 27, 2015. FINDINGS: No acute fracture or dislocation. No joint effusion. Tricompartmental degenerative changes, severe in the lateral and patellofemoral compartments, similar to prior study.  Osteopenia. Vascular calcifications. Soft tissues are unremarkable. IMPRESSION: 1.  No acute osseous abnormality. 2. Stable severe osteoarthritis. Electronically Signed   By: Obie Dredge M.D.   On: 11/01/2018 11:09   Dg Hip Unilat With Pelvis 2-3 Views Right Result Date: 11/01/2018 CLINICAL DATA:  Fall. EXAM: DG HIP (WITH OR WITHOUT PELVIS) 2-3V RIGHT COMPARISON:  Right hip x-rays dated September 27, 2015. FINDINGS: No acute fracture or dislocation. The pubic symphysis and sacroiliac joints are intact. Unchanged degenerative sclerosis of the pubic symphysis. The hip joint spaces are relatively preserved. Osteopenia. Soft tissues are unremarkable. IMPRESSION: No acute osseous abnormality. Electronically Signed   By: Obie Dredge M.D.   On: 11/01/2018 11:07     1140:  XR/CT reassuring. Pt has ambulated with steady gait, easy resps, NAD. Family would like to take her home now. Dx and testing d/w pt and family.  Questions answered.  Verb understanding, agreeable to d/c home with outpt f/u.    Final Clinical Impressions(s) / ED Diagnoses   Final diagnoses:  None  ED Discharge Orders    None       Samuel Jester, DO 11/05/18 1478

## 2018-11-01 NOTE — ED Notes (Signed)
Patient ambulatory to restroom.  Patient required very little assistance.

## 2018-11-01 NOTE — Discharge Instructions (Signed)
Take over the counter tylenol, as directed on packaging, as needed for discomfort.  Apply moist heat or ice to the area(s) of discomfort, for 15 minutes at a time, several times per day for the next few days.  Do not fall asleep on a heating or ice pack.  Call your regular medical doctor today to schedule a follow up appointment this week.  Return to the Emergency Department immediately if worsening. ° °

## 2018-11-01 NOTE — ED Notes (Signed)
ED Provider at bedside. 

## 2019-02-23 ENCOUNTER — Other Ambulatory Visit: Payer: Self-pay

## 2019-02-23 ENCOUNTER — Encounter (HOSPITAL_COMMUNITY): Payer: Self-pay | Admitting: Emergency Medicine

## 2019-02-23 ENCOUNTER — Inpatient Hospital Stay (HOSPITAL_COMMUNITY)
Admission: EM | Admit: 2019-02-23 | Discharge: 2019-02-28 | DRG: 378 | Disposition: A | Discharge status: Home / Self Care | Payer: Medicare Other | Attending: Internal Medicine | Admitting: Internal Medicine

## 2019-02-23 DIAGNOSIS — Z79899 Other long term (current) drug therapy: Secondary | ICD-10-CM

## 2019-02-23 DIAGNOSIS — Z880 Allergy status to penicillin: Secondary | ICD-10-CM

## 2019-02-23 DIAGNOSIS — D62 Acute posthemorrhagic anemia: Secondary | ICD-10-CM | POA: Diagnosis present

## 2019-02-23 DIAGNOSIS — K5731 Diverticulosis of large intestine without perforation or abscess with bleeding: Principal | ICD-10-CM | POA: Diagnosis present

## 2019-02-23 DIAGNOSIS — Z515 Encounter for palliative care: Secondary | ICD-10-CM | POA: Diagnosis present

## 2019-02-23 DIAGNOSIS — I4892 Unspecified atrial flutter: Secondary | ICD-10-CM | POA: Diagnosis present

## 2019-02-23 DIAGNOSIS — I10 Essential (primary) hypertension: Secondary | ICD-10-CM | POA: Diagnosis present

## 2019-02-23 DIAGNOSIS — D696 Thrombocytopenia, unspecified: Secondary | ICD-10-CM | POA: Diagnosis present

## 2019-02-23 DIAGNOSIS — F17220 Nicotine dependence, chewing tobacco, uncomplicated: Secondary | ICD-10-CM | POA: Diagnosis present

## 2019-02-23 DIAGNOSIS — K922 Gastrointestinal hemorrhage, unspecified: Secondary | ICD-10-CM | POA: Diagnosis present

## 2019-02-23 DIAGNOSIS — K5790 Diverticulosis of intestine, part unspecified, without perforation or abscess without bleeding: Secondary | ICD-10-CM | POA: Diagnosis present

## 2019-02-23 DIAGNOSIS — Z7982 Long term (current) use of aspirin: Secondary | ICD-10-CM

## 2019-02-23 DIAGNOSIS — I248 Other forms of acute ischemic heart disease: Secondary | ICD-10-CM | POA: Diagnosis present

## 2019-02-23 DIAGNOSIS — Z8249 Family history of ischemic heart disease and other diseases of the circulatory system: Secondary | ICD-10-CM

## 2019-02-23 DIAGNOSIS — Z66 Do not resuscitate: Secondary | ICD-10-CM | POA: Diagnosis present

## 2019-02-23 DIAGNOSIS — E876 Hypokalemia: Secondary | ICD-10-CM | POA: Diagnosis present

## 2019-02-23 DIAGNOSIS — H547 Unspecified visual loss: Secondary | ICD-10-CM | POA: Diagnosis present

## 2019-02-23 DIAGNOSIS — Z8673 Personal history of transient ischemic attack (TIA), and cerebral infarction without residual deficits: Secondary | ICD-10-CM

## 2019-02-23 DIAGNOSIS — K5730 Diverticulosis of large intestine without perforation or abscess without bleeding: Secondary | ICD-10-CM | POA: Diagnosis not present

## 2019-02-23 NOTE — ED Triage Notes (Signed)
[facility] called out for rectal bleeding tonight, pt reports dark red loose stools, some abd distention, nausea with no vomiting; pt has hx of same and polyps, Htn, and arthritis; per [facility] pt bp 198/102 en route, CBG 104, temp 98.1

## 2019-02-24 ENCOUNTER — Observation Stay (HOSPITAL_COMMUNITY): Payer: Medicare Other

## 2019-02-24 ENCOUNTER — Other Ambulatory Visit: Payer: Self-pay

## 2019-02-24 ENCOUNTER — Encounter (HOSPITAL_COMMUNITY): Payer: Self-pay | Admitting: *Deleted

## 2019-02-24 DIAGNOSIS — Z515 Encounter for palliative care: Secondary | ICD-10-CM

## 2019-02-24 DIAGNOSIS — D62 Acute posthemorrhagic anemia: Secondary | ICD-10-CM

## 2019-02-24 DIAGNOSIS — K922 Gastrointestinal hemorrhage, unspecified: Secondary | ICD-10-CM

## 2019-02-24 DIAGNOSIS — I10 Essential (primary) hypertension: Secondary | ICD-10-CM

## 2019-02-24 DIAGNOSIS — K5790 Diverticulosis of intestine, part unspecified, without perforation or abscess without bleeding: Secondary | ICD-10-CM

## 2019-02-24 DIAGNOSIS — E876 Hypokalemia: Secondary | ICD-10-CM

## 2019-02-24 LAB — TROPONIN I
Troponin I: <0.03 ng/mL (ref <0.03)
Troponin I: <0.03 ng/mL (ref <0.03)

## 2019-02-24 LAB — COMPREHENSIVE METABOLIC PANEL
ALT: 12 U/L (ref 0–44)
AST: 22 U/L (ref 15–41)
Albumin: 3.9 g/dL (ref 3.5–5.0)
Alkaline Phosphatase: 40 U/L (ref 38–126)
Anion gap: 10 (ref 5–15)
BUN: 14 mg/dL (ref 8–23)
CO2: 26 mmol/L (ref 22–32)
Calcium: 8.7 mg/dL — ABNORMAL LOW (ref 8.9–10.3)
Chloride: 103 mmol/L (ref 98–111)
Creatinine, Ser: 0.87 mg/dL (ref 0.44–1.00)
GFR calc Af Amer: >60 mL/min (ref >60)
GFR calc non Af Amer: 54 mL/min — ABNORMAL LOW (ref >60)
Glucose, Bld: 136 mg/dL — ABNORMAL HIGH (ref 70–99)
Potassium: 2.7 mmol/L — CL (ref 3.5–5.1)
Sodium: 139 mmol/L (ref 135–145)
Total Bilirubin: 0.7 mg/dL (ref 0.3–1.2)
Total Protein: 7.3 g/dL (ref 6.5–8.1)

## 2019-02-24 LAB — HEMOGLOBIN AND HEMATOCRIT, BLOOD
HCT: 28.3 % — ABNORMAL LOW (ref 36.0–46.0)
HCT: 25.5 % — ABNORMAL LOW (ref 36.0–46.0)
HCT: 28.4 % — ABNORMAL LOW (ref 36.0–46.0)
HCT: 26.7 % — ABNORMAL LOW (ref 36.0–46.0)
HEMATOCRIT: 25.1 % — AB (ref 36.0–46.0)
HEMOGLOBIN: 8.2 g/dL — AB (ref 12.0–15.0)
HEMOGLOBIN: 8.7 g/dL — AB (ref 12.0–15.0)
Hemoglobin: 9.1 g/dL — ABNORMAL LOW (ref 12.0–15.0)
Hemoglobin: 8.2 g/dL — ABNORMAL LOW (ref 12.0–15.0)
Hemoglobin: 7.6 g/dL — ABNORMAL LOW (ref 12.0–15.0)

## 2019-02-24 LAB — BASIC METABOLIC PANEL
Anion gap: 9 (ref 5–15)
Anion gap: 7 (ref 5–15)
BUN: 18 mg/dL (ref 8–23)
BUN: 21 mg/dL (ref 8–23)
CO2: 24 mmol/L (ref 22–32)
CO2: 24 mmol/L (ref 22–32)
Calcium: 7.9 mg/dL — ABNORMAL LOW (ref 8.9–10.3)
Calcium: 7.8 mg/dL — ABNORMAL LOW (ref 8.9–10.3)
Chloride: 106 mmol/L (ref 98–111)
Chloride: 109 mmol/L (ref 98–111)
Creatinine, Ser: 0.88 mg/dL (ref 0.44–1.00)
Creatinine, Ser: 0.88 mg/dL (ref 0.44–1.00)
GFR calc Af Amer: >60 mL/min (ref >60)
GFR calc Af Amer: >60 mL/min (ref >60)
GFR calc non Af Amer: 53 mL/min — ABNORMAL LOW (ref >60)
GFR, EST NON AFRICAN AMERICAN: 53 mL/min — AB (ref >60)
Glucose, Bld: 119 mg/dL — ABNORMAL HIGH (ref 70–99)
Glucose, Bld: 138 mg/dL — ABNORMAL HIGH (ref 70–99)
POTASSIUM: 3.3 mmol/L — AB (ref 3.5–5.1)
Potassium: 3.3 mmol/L — ABNORMAL LOW (ref 3.5–5.1)
Sodium: 139 mmol/L (ref 135–145)
Sodium: 140 mmol/L (ref 135–145)

## 2019-02-24 LAB — MAGNESIUM
Magnesium: 1.9 mg/dL (ref 1.7–2.4)
Magnesium: 2.1 mg/dL (ref 1.7–2.4)

## 2019-02-24 LAB — TYPE AND SCREEN
ABO/RH(D): A POS
Antibody Screen: NEGATIVE

## 2019-02-24 LAB — POC OCCULT BLOOD, ED: Fecal Occult Bld: POSITIVE — AB

## 2019-02-24 LAB — CBC
HCT: 34.5 % — ABNORMAL LOW (ref 36.0–46.0)
Hemoglobin: 11.2 g/dL — ABNORMAL LOW (ref 12.0–15.0)
MCH: 29.9 pg (ref 26.0–34.0)
MCHC: 32.5 g/dL (ref 30.0–36.0)
MCV: 92.2 fL (ref 80.0–100.0)
Platelets: 155 10*3/uL (ref 150–400)
RBC: 3.74 MIL/uL — ABNORMAL LOW (ref 3.87–5.11)
RDW: 14.1 % (ref 11.5–15.5)
WBC: 5 10*3/uL (ref 4.0–10.5)
nRBC: 0 % (ref 0.0–0.2)

## 2019-02-24 LAB — PROTIME-INR
INR: 1.2 (ref 0.8–1.2)
Prothrombin Time: 15.2 seconds (ref 11.4–15.2)

## 2019-02-24 MED ORDER — PANTOPRAZOLE SODIUM 40 MG IV SOLR
INTRAVENOUS | Status: AC
  Filled 2019-02-24: qty 160

## 2019-02-24 MED ORDER — ACETAMINOPHEN 325 MG PO TABS: 650.0000 mg | ORAL_TABLET | Freq: Four times a day (QID) | ORAL | Status: DC | PRN

## 2019-02-24 MED ORDER — ALBUTEROL SULFATE HFA 108 (90 BASE) MCG/ACT IN AERS
2.0000 | INHALATION_SPRAY | Freq: Four times a day (QID) | RESPIRATORY_TRACT | Status: DC | PRN
  Filled 2019-02-24: qty 6.7

## 2019-02-24 MED ORDER — TECHNETIUM TC 99M-LABELED RED BLOOD CELLS IV KIT
25.0000 | PACK | Freq: Once | INTRAVENOUS | Status: AC | PRN
  Administered 2019-02-24: 25 via INTRAVENOUS

## 2019-02-24 MED ORDER — HALOPERIDOL LACTATE 5 MG/ML IJ SOLN
2.0000 mg | Freq: Four times a day (QID) | INTRAMUSCULAR | Status: DC | PRN
  Administered 2019-02-25 – 2019-02-26 (×5): 2 mg via INTRAVENOUS
  Filled 2019-02-24 (×5): qty 1

## 2019-02-24 MED ORDER — ACETAMINOPHEN 650 MG RE SUPP: 650.0000 mg | Freq: Four times a day (QID) | RECTAL | Status: DC | PRN

## 2019-02-24 MED ORDER — PANTOPRAZOLE SODIUM 40 MG IV SOLR
40.0000 mg | Freq: Two times a day (BID) | INTRAVENOUS | Status: DC
  Administered 2019-02-24: 40 mg via INTRAVENOUS
  Filled 2019-02-24: qty 40

## 2019-02-24 MED ORDER — POTASSIUM CHLORIDE 10 MEQ/100ML IV SOLN
10.0000 meq | Freq: Once | INTRAVENOUS | Status: AC
  Administered 2019-02-24: 10 meq via INTRAVENOUS
  Filled 2019-02-24: qty 100

## 2019-02-24 MED ORDER — HEPARIN SOD (PORK) LOCK FLUSH 100 UNIT/ML IV SOLN
INTRAVENOUS | Status: AC
  Filled 2019-02-24: qty 5

## 2019-02-24 MED ORDER — POTASSIUM CHLORIDE 10 MEQ/100ML IV SOLN
10.0000 meq | INTRAVENOUS | Status: AC
  Administered 2019-02-24 (×3): 10 meq via INTRAVENOUS
  Filled 2019-02-24 (×5): qty 100

## 2019-02-24 MED ORDER — POTASSIUM CHLORIDE ER 10 MEQ PO TBCR
10.0000 meq | EXTENDED_RELEASE_TABLET | Freq: Every day | ORAL | Status: DC
  Administered 2019-02-24 – 2019-02-25 (×2): 10 meq via ORAL
  Filled 2019-02-24 (×6): qty 1

## 2019-02-24 MED ORDER — SODIUM CHLORIDE 0.9 % IV SOLN
INTRAVENOUS | Status: DC
  Administered 2019-02-24: 02:00:00 via INTRAVENOUS

## 2019-02-24 MED ORDER — CLONAZEPAM 0.5 MG PO TABS
0.2500 mg | ORAL_TABLET | Freq: Every evening | ORAL | Status: DC | PRN
  Administered 2019-02-24: 0.25 mg via ORAL
  Filled 2019-02-24 (×2): qty 1

## 2019-02-24 MED ORDER — TRAMADOL HCL 50 MG PO TABS
25.0000 mg | ORAL_TABLET | Freq: Three times a day (TID) | ORAL | Status: DC | PRN
  Administered 2019-02-24 (×2): 25 mg via ORAL
  Filled 2019-02-24 (×3): qty 1

## 2019-02-24 MED ORDER — POTASSIUM CHLORIDE IN NACL 20-0.9 MEQ/L-% IV SOLN
INTRAVENOUS | Status: DC
  Administered 2019-02-24: 14:00:00 via INTRAVENOUS

## 2019-02-24 MED ORDER — SODIUM CHLORIDE 0.9 % IV SOLN
8.0000 mg/h | INTRAVENOUS | Status: DC
  Administered 2019-02-24 – 2019-02-26 (×5): 8 mg/h via INTRAVENOUS
  Filled 2019-02-24 (×8): qty 80

## 2019-02-24 MED ORDER — PANTOPRAZOLE SODIUM 40 MG IV SOLR: 40.0000 mg | Freq: Two times a day (BID) | INTRAVENOUS | Status: DC

## 2019-02-24 MED ORDER — SODIUM CHLORIDE 0.9% IV SOLUTION
Freq: Once | INTRAVENOUS | Status: AC
  Administered 2019-02-24: 13:00:00 via INTRAVENOUS

## 2019-02-24 MED ORDER — MAGNESIUM SULFATE IN D5W 1-5 GM/100ML-% IV SOLN
1.0000 g | Freq: Once | INTRAVENOUS | Status: AC
  Administered 2019-02-24: 1 g via INTRAVENOUS
  Filled 2019-02-24 (×2): qty 100

## 2019-02-24 MED ORDER — SODIUM CHLORIDE 0.9 % IV SOLN
80.0000 mg | Freq: Once | INTRAVENOUS | Status: AC
  Administered 2019-02-24: 80 mg via INTRAVENOUS
  Filled 2019-02-24: qty 80

## 2019-02-24 NOTE — Progress Notes (Signed)
Hemoglobin around 4 PM is 8.2. GI bleeding scan is negative.  Discussed with [name] who is not interested for mother to undergo any endoscopic procedures. Therefore will continue supportive measures and transfusion if hemoglobin drops below 7 g in which case we will reevaluate condition with patient's daughters.  Please page me over the weekend if you have any questions.

## 2019-02-24 NOTE — H&P (Addendum)
[facility] History and Physical  Lynn Nichols VQM:[id number] DOB: 1914-01-06 DOA: 02/23/2019  Referring physician:  PCP: [facility]   Chief Complaint: GI bleed  HPI: Lynn Nichols is a 85 y.o. BF PMHx back pain, HTN, CVA, GI bleed, diverticulosis  presents today from home by EMS for evaluation of rectal bleeding. She has been having maroon-colored stools since yesterday. She denies to me she is experiencing abdominal pain. She denies any fever. Patient's history is somewhat limited secondary to advanced age.   Addendum; on Second Visit Patient Very Upset That Her Family Has Left Her Here and states emphatically wants to go home.  Review of Systems:  Constitutional:  No weight loss, night sweats, Fevers, chills, fatigue.  HEENT:  No headaches, Difficulty swallowing,Tooth/dental problems,Sore throat,  No sneezing, itching, ear ache, nasal congestion, post nasal drip,  Cardio-vascular:  No chest pain, Orthopnea, PND, swelling in lower extremities, anasarca, dizziness, palpitations  GI:  No heartburn, indigestion, abdominal pain, nausea, vomiting, diarrhea, positive maroon-colored stool, negative loss of appetite  Resp:  No shortness of breath with exertion or at rest. No excess mucus, no productive cough, No non-productive cough, No coughing up of blood.No change in color of mucus.No wheezing.No chest wall deformity  Skin:  no rash or lesions.  GU:  no dysuria, change in color of urine, no urgency or frequency. No flank pain.  Musculoskeletal:  No joint pain or swelling. No decreased range of motion. No back pain.  Psych:  No change in mood or affect. No depression or anxiety. No memory loss.   Past Medical History:  Diagnosis Date  . Arthritis   . Back pain   . Diverticulosis   . Headache   . Hypertension   . Lower GI bleeding   . Stroke (HCC) 04/2018   difficulty with vision in left eye from stroke   Past Surgical History:   Procedure Laterality Date  . ABDOMINAL HYSTERECTOMY    . CHOLECYSTECTOMY    . COLONOSCOPY N/A 07/01/2014   Procedure: COLONOSCOPY;  Surgeon: Malissa Hippo, MD;  [facility];  Service: Endoscopy;  Laterality: N/A;  . COLONOSCOPY N/A 07/05/2014   Procedure: COLONOSCOPY;  Surgeon: Malissa Hippo, MD;  Location: [facility];  Service: Endoscopy;  Laterality: N/A;   Social History:  reports that she has never smoked. Her smokeless tobacco use includes snuff. She reports that she does not drink alcohol or use drugs.  Allergies  Allergen Reactions  . Aspirin     Advised by MD not take due to bleed  . Penicillins Other (See Comments)    Has patient had a PCN reaction causing immediate rash, facial/tongue/throat swelling, SOB or lightheadedness with hypotension: No Has patient had a PCN reaction causing severe rash involving mucus membranes or skin necrosis: No Has patient had a PCN reaction that required hospitalization YES Has patient had a PCN reaction occurring within the last 10 years: No If all of the above answers are "NO", then may proceed with Cephalosporin use. "PASSES OUT"    Family History  Problem Relation Age of Onset  . Hypertension Mother   . Hypertension Father   . Diabetes Other       Prior to Admission medications   Medication Sig Start Date End Date Taking? Authorizing Provider  albuterol (PROAIR HFA) 108 (90 BASE) MCG/ACT inhaler Inhale 2 puffs into the lungs every 6 (six) hours as needed. For shortness of breath    [provider]  Camphor-Eucalyptus-Menthol (VICKS VAPORUB EX) Apply 1 application topically daily as needed (pain).    [provider]  clonazePAM (KLONOPIN) 0.5 MG tablet Take 0.25 mg by mouth at bedtime as needed (sleep).  02/14/18   [provider]  ferrous sulfate 325 (65 FE) MG EC tablet Take 1 tablet (325 mg total) by mouth 2 (two) times daily with a meal. Patient taking differently: Take 325 mg by mouth  daily.  04/24/17   Shon Hale, MD  NIFEdipine (PROCARDIA-XL/ADALAT CC) 30 MG 24 hr tablet Take 1 tablet by mouth daily. 08/28/17   [provider]  potassium chloride (K-DUR) 10 MEQ tablet Take 1 tablet (10 mEq total) by mouth daily. 07/25/18   Bethann Berkshire, MD  sodium chloride (MURO 128) 2 % ophthalmic solution Place 1 drop into both eyes daily as needed for eye irritation.    [provider]  traMADol (ULTRAM) 50 MG tablet Take 25-50 mg by mouth 3 (three) times daily as needed for moderate pain.     [provider]  Vitamin D, Ergocalciferol, (DRISDOL) 50000 units CAPS capsule Take 50,000 Units by mouth every 7 (seven) days. Takes on Sundays.    [provider]     Consultants:  None  Procedures/Significant Events:  None  I have personally reviewed and interpreted all radiology studies and my findings are as above.   VENTILATOR SETTINGS: None   Cultures None  Antimicrobials: None   Devices None   LINES / TUBES:  None    Continuous Infusions: . potassium chloride      Physical Exam: Vitals:   02/23/19 2352 02/24/19 0000 02/24/19 0028 02/24/19 0100  BP: (!) 202/111 (!) 200/79  109/65  Pulse: 90 62 67   Resp: 19 20 20 17   Temp: 98.2 F (36.8 C)     TempSrc: Oral     SpO2: 98% 98% 100% 100%  Weight:      Height:        Wt Readings from Last 3 Encounters:  02/23/19 59 kg  11/01/18 60.8 kg  07/25/18 58.1 kg    General: A/O x1 (does not know where, when, why) no acute respiratory distress, cachectic Eyes: negative scleral hemorrhage, negative anisocoria, negative icterus ENT: Negative Runny nose, negative gingival bleeding, Neck:  Negative scars, masses, torticollis, lymphadenopathy, JVD Lungs: Clear to auscultation bilaterally without wheezes or crackles Cardiovascular: Regular rate and rhythm without murmur gallop or rub normal S1 and S2 Abdomen: negative abdominal pain, nondistended, positive soft, bowel sounds,  no rebound, no ascites, no appreciable mass Extremities: No significant cyanosis, clubbing, or edema bilateral lower extremities Skin: Negative rashes, lesions, ulcers Psychiatric: Pleasantly demented, negative depression, positive anxiety, negative fatigue, negative mania  Central nervous system:  Cranial nerves II through XII intact, tongue/uvula midline, all extremities muscle strength 5/5, sensation intact throughout, negative dysarthria, negative expressive aphasia, negative receptive aphasia.        Labs on Admission:  Basic Metabolic Panel: Recent Labs  Lab 02/23/19 2359  NA 139  K 2.7*  CL 103  CO2 26  GLUCOSE 136*  BUN 14  CREATININE 0.87  CALCIUM 8.7*   Liver Function Tests: Recent Labs  Lab 02/23/19 2359  AST 22  ALT 12  ALKPHOS 40  BILITOT 0.7  PROT 7.3  ALBUMIN 3.9   No results for input(s): LIPASE, AMYLASE in the last 168 hours. No results for input(s): AMMONIA in the last 168 hours. CBC: Recent Labs  Lab 02/23/19 2359  WBC  5.0  HGB 11.2*  HCT 34.5*  MCV 92.2  PLT 155   Cardiac Enzymes: No results for input(s): CKTOTAL, CKMB, CKMBINDEX, TROPONINI in the last 168 hours.  BNP (last 3 results) No results for input(s): BNP in the last 8760 hours.  ProBNP (last 3 results) No results for input(s): PROBNP in the last 8760 hours.  CBG: No results for input(s): GLUCAP in the last 168 hours.  Radiological Exams on Admission: No results found.  EKG: Independently reviewed.  Atrial flutter  Assessment/Plan Active Problems:   Acute lower gastrointestinal bleeding   Essential hypertension   Acute blood loss anemia   Diverticulosis   Palliative care encounter   Hypokalemia   Acute lower GI bleed (baseline hemoglobin~12+) - Patient has history of GI bleed appears to lost 1 unit of PRBC in last several months. -Transfuse for hemoglobin<7 - Monitor H/H every 4 hours -Not fully convinced upper GI bleed (most likely lower GI source/middle GI  source) but will place patient on Protonix drip  -NM GI Blood Loss Scan ordered -Consult GI in a.m. given patient's advanced age and minimal blood loss believe they will choose conservative management.  Diverticulosis -See lower GI bleed  Atrial flutter - Atrial flutter on EKG: Changed from 07/25/2018 (sinus arrhythmia)  -Currently sinus tachycardia  Hypokalemia - Replaced in ED -A.m. labs patient still hypokalemic.  Potassium 40 mEq IV - Potassium goal> 4  Hypomagnesmia -Magnesium goal> 2 - Magnesium 1 g IV   Essential HTN - BP stable hold all BP medication at this time.  Goals of care - Palliative care consult placed.  Given patient's advanced age, conservative measures vs invasive measure  NOTE: Patient emphatically states wants to go home however currently not competent to make medical decisions family needs to have long discussion with palliative care and GI before decision on treatment    Code Status: DNR (DVT Prophylaxis: SCD Family Communication: None Disposition Plan: TBD   Data Reviewed: Care during the described time interval was provided by me .  I have reviewed this patient's available data, including medical history, events of note, physical examination, and all test results as part of my evaluation.   Time spent: 60 min  , Roselind MessierCURTIS J [facility] Pager [phone]

## 2019-02-24 NOTE — ED Notes (Signed)
Pure wick removed . Patient has had second brief change. Pure wick saturated and removed

## 2019-02-24 NOTE — ED Provider Notes (Signed)
[facility] EMERGENCY DEPARTMENT Provider Note   CSN: [id number] Arrival date & time: 02/23/19  2345    History   Chief Complaint Chief Complaint  Patient presents with   Rectal Bleeding    HPI Lynn Nichols is a 85 y.o. female.     Patient is a 85 year old female with past medical history of diverticulosis, lower GI bleeding, hypertension.  She presents today from home by EMS for evaluation of rectal bleeding.  She has been having maroon-colored stools since yesterday.  She denies to me she is experiencing abdominal pain.  She denies any fever.  Patient's history is somewhat limited secondary to advanced age.  The history is provided by the patient and the EMS personnel.  Rectal Bleeding  Quality:  Maroon Amount:  Moderate Timing:  Constant Chronicity:  Recurrent Relieved by:  Nothing Worsened by:  Nothing Ineffective treatments:  None tried Associated symptoms: no abdominal pain and no fever     Past Medical History:  Diagnosis Date   Arthritis    Back pain    Diverticulosis    Headache    Hypertension    Lower GI bleeding    Stroke (HCC) 04/2018   difficulty with vision in left eye from stroke    Patient Active Problem List   Diagnosis Date Noted   Lower GI bleeding 11/17/2017   Rectal bleed 11/17/2017   Elevated troponin 11/17/2017   Hypokalemia 11/17/2017   Palliative care encounter    Goals of care, counseling/discussion    Encounter for hospice care discussion    Lower GI bleed 04/22/2017   Hypertension 04/22/2017   Diverticulosis 04/22/2017   Acute blood loss anemia 07/05/2014   Acute lower gastrointestinal bleeding 06/30/2014   Essential hypertension 06/30/2014   Acute lower GI bleeding 06/30/2014    Past Surgical History:  Procedure Laterality Date   ABDOMINAL HYSTERECTOMY     CHOLECYSTECTOMY     COLONOSCOPY N/A 07/01/2014   Procedure: COLONOSCOPY;  Surgeon: Malissa Hippo, MD;  Location: [facility];   Service: Endoscopy;  Laterality: N/A;   COLONOSCOPY N/A 07/05/2014   Procedure: COLONOSCOPY;  Surgeon: Malissa Hippo, MD;  Location: [facility];  Service: Endoscopy;  Laterality: N/A;     OB History    Gravida  14   Para  13   Term  13   Preterm      AB  1   Living  6     SAB  1   TAB      Ectopic      Multiple      Live Births               Home Medications    Prior to Admission medications   Medication Sig Start Date End Date Taking? Authorizing Provider  albuterol (PROAIR HFA) 108 (90 BASE) MCG/ACT inhaler Inhale 2 puffs into the lungs every 6 (six) hours as needed. For shortness of breath    [provider]  Camphor-Eucalyptus-Menthol (VICKS VAPORUB EX) Apply 1 application topically daily as needed (pain).    [provider]  clonazePAM (KLONOPIN) 0.5 MG tablet Take 0.25 mg by mouth at bedtime as needed (sleep).  02/14/18   [provider]  ferrous sulfate 325 (65 FE) MG EC tablet Take 1 tablet (325 mg total) by mouth 2 (two) times daily with a meal. Patient taking differently: Take 325 mg by mouth daily.  04/24/17   Shon Hale, MD  NIFEdipine (PROCARDIA-XL/ADALAT CC) 30  MG 24 hr tablet Take 1 tablet by mouth daily. 08/28/17   [provider]  potassium chloride (K-DUR) 10 MEQ tablet Take 1 tablet (10 mEq total) by mouth daily. 07/25/18   Bethann Berkshire, MD  sodium chloride (MURO 128) 2 % ophthalmic solution Place 1 drop into both eyes daily as needed for eye irritation.    [provider]  traMADol (ULTRAM) 50 MG tablet Take 25-50 mg by mouth 3 (three) times daily as needed for moderate pain.     [provider]  Vitamin D, Ergocalciferol, (DRISDOL) 50000 units CAPS capsule Take 50,000 Units by mouth every 7 (seven) days. Takes on Sundays.    [provider]    Family History Family History  Problem Relation Age of Onset   Hypertension Mother    Hypertension Father    Diabetes Other       Social History Social History   Tobacco Use   Smoking status: Never Smoker   Smokeless tobacco: Current User    Types: Snuff  Substance Use Topics   Alcohol use: No    Alcohol/week: 0.0 standard drinks   Drug use: No     Allergies   Aspirin and Penicillins   Review of Systems Review of Systems  Constitutional: Negative for fever.  Gastrointestinal: Positive for hematochezia. Negative for abdominal pain.  All other systems reviewed and are negative.    Physical Exam Updated Vital Signs BP (!) 200/79    Pulse 67    Temp 98.2 F (36.8 C) (Oral)    Resp 20    Ht 5\' 2"  (1.575 m)    Wt 59 kg    SpO2 100%    BMI 23.78 kg/m   Physical Exam Vitals signs and nursing note reviewed.  Constitutional:      General: She is not in acute distress.    Appearance: She is well-developed. She is not diaphoretic.  HENT:     Head: Normocephalic and atraumatic.  Neck:     Musculoskeletal: Normal range of motion and neck supple.  Cardiovascular:     Rate and Rhythm: Normal rate and regular rhythm.     Heart sounds: No murmur. No friction rub. No gallop.   Pulmonary:     Effort: Pulmonary effort is normal. No respiratory distress.     Breath sounds: Normal breath sounds. No wheezing.  Abdominal:     General: Bowel sounds are normal. There is no distension.     Palpations: Abdomen is soft.     Tenderness: There is no abdominal tenderness.  Genitourinary:    Comments: Rectum appears unremarkable.  There are no definite hemorrhoids or fissures.  There is maroon-colored stool present in the depends. Musculoskeletal: Normal range of motion.  Skin:    General: Skin is warm and dry.  Neurological:     Mental Status: She is alert and oriented to person, place, and time.      ED Treatments / Results  Labs (all labs ordered are listed, but only abnormal results are displayed) Labs Reviewed  COMPREHENSIVE METABOLIC PANEL - Abnormal; Notable for the following components:       Result Value   Potassium 2.7 (*)    Glucose, Bld 136 (*)    Calcium 8.7 (*)    GFR calc non Af Amer 54 (*)    All other components within normal limits  CBC - Abnormal; Notable for the following components:   RBC 3.74 (*)    Hemoglobin 11.2 (*)  HCT 34.5 (*)    All other components within normal limits  POC OCCULT BLOOD, ED - Abnormal; Notable for the following components:   Fecal Occult Bld POSITIVE (*)    All other components within normal limits  TYPE AND SCREEN    EKG EKG Interpretation  Date/Time:  Thursday February 23 2019 23:50:14 EDT Ventricular Rate:  85 PR Interval:    QRS Duration: 89 QT Interval:  380 QTC Calculation: 442 R Axis:   -28 Text Interpretation:  Atrial flutter Ventricular premature complex Abnormal R-wave progression, early transition LVH by voltage ST depr, consider ischemia, inferior leads Confirmed by Geoffery Lyons ([id number]) on 02/24/2019 12:44:10 AM   Radiology No results found.  Procedures Procedures (including critical care time)  Medications Ordered in ED Medications - No data to display   Initial Impression / Assessment and Plan / ED Course  I have reviewed the triage vital signs and the nursing notes.  Pertinent labs & imaging results that were available during my care of the patient were reviewed by me and considered in my medical decision making (see chart for details).  Patient brought from home for evaluation of rectal bleeding.  She has passed several episodes of maroon-colored stool.  Patient has had an episode of this in the ER.  She is hemodynamically stable and hemoglobin is 11.2, which I predict will drop based on what I have seen and what was reported to me.  I feel as though admission for observation is appropriate.  Have spoken with Dr. Joseph Art who agrees to admit.  Final Clinical Impressions(s) / ED Diagnoses   Final diagnoses:  None    ED Discharge Orders    None       Geoffery Lyons, MD 02/24/19 0300

## 2019-02-24 NOTE — Care Management Obs Status (Signed)
MEDICARE OBSERVATION STATUS NOTIFICATION   Patient Details  Name: Lynn Nichols MRN: [id number] Date of Birth: [birth year]   Medicare Observation Status Notification Given:  Other (see comment)(printed and given to RN to place at bedside)    Corey Harold 02/24/2019, 5:09 PM

## 2019-02-24 NOTE — Progress Notes (Signed)
Palliative Medicine consult order noted. PMT provider will return to [facility] on Monday, February 27, 2019. If the patient remains hospitalized, the consult will be evaluated at that time. If recommendations are needed in the interim, please call our office at [phone].   Margret Chance Francesa Eugenio, RN, BSN, [facility] Palliative Medicine Team 02/24/2019 8:09 AM Office [phone]

## 2019-02-24 NOTE — ED Notes (Signed)
Date and time results received: 02/24/19 12:45 AM (use smartphrase ".now" to insert current time)  Test: potassium Critical Value: 2.7  Name of Provider Notified: Dr Judd Lien  Orders Received? Or Actions Taken?: no

## 2019-02-24 NOTE — Progress Notes (Signed)
Pt disoriented, pulling at IV tubes, trying to get out of bed across rails. BP 164/74, 107, sats 88%, applied O2 @ 2 L/min via [place]. Dr Tat in to assess. Will continue to monitor.

## 2019-02-24 NOTE — Progress Notes (Signed)
PROGRESS NOTE  Lynn Nichols:[id number] DOB: 09-25-1914 DOA: 02/23/2019 PCP: [facility]  Brief History:  85 year old female with history of hypertension, lower GI bleed, stroke, diverticulosis, atrial flutter presenting with hematochezia.  The patient had 2 episodes of maroon-colored stools on 02/23/2019.  She denies any fevers, chills, chest pain, shortness breath, abdominal pain.  Remainder review of systems and history is limited secondary the patient's cognitive impairment.  Upon presentation, the patient continued to have hematochezia with loose stools.  Flexi-Seal was placed.  GI was consulted to assist with management.  The patient was typed and screened.  Hemoglobin was 11.2 at the time presentation and was trended during the hospitalization.  The patient had a colonoscopy in 2015 by Dr. Kerry Hough which revealed scattered diverticuli throughout the colon without stigmata of bleeding.  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  The patient was most recently admitted in December 2018 for lower GI bleed which was attributable to her rectosigmoid ulceration.  Assessment/Plan: Acute blood loss anemia -Patient has been typed and screened -Transfuse for hemoglobin less than 7 -GI consulted  Lower GI bleed -Likely diverticular bleed versus bleeding from the patient's rectosigmoid ulcer -She remains hemodynamically stable -Clear liquid diet -continues to have rectal bleeding through rectal tube -GI consulted  Hypokalemia -Replete -Check magnesium  ElevatedTroponin -No chest pain presently -Personally reviewed EKG--atrial flutter, nonspecific ST-T wave changes -Secondary to demand ischemia -11/18/2017 echo--EF 65-70%, no WMA, mild AI, MR  Essential hypertension -Holding nifedipine -Monitor blood pressure off antihypertensive medication  Atrial flutter -Rate controlled -Patient is  not a candidate for anticoagulation secondary to her recurrent GI bleed  Goals of Care -palliative medicine consulted -DNR     Disposition Plan:   Home in 2-3 days  Family Communication:   Family updated on phone  Consultants:  GI  Code Status:  DNR  DVT Prophylaxis:  SCDs   Procedures: As Listed in Progress Note Above  Antibiotics: None    Total time spent 35 minutes.  Greater than 50% spent face to face counseling and coordinating care. 0940 to 1015    Subjective: Patient denies fevers, chills,  chest pain, dyspnea, nausea, vomiting, diarrhea, abdominal pain, dysuria,    Objective: Vitals:   02/24/19 0200 02/24/19 0300 02/24/19 0351 02/24/19 0556  BP: (!) 151/74 135/66 (!) 146/72 118/62  Pulse:  66 89 (!) 58  Resp: 18 19 20 18   Temp:    98.1 F (36.7 C)  TempSrc:    Axillary  SpO2: 100% 96% 100% 100%  Weight:      Height:        Intake/Output Summary (Last 24 hours) at 02/24/2019 1005 Last data filed at 02/24/2019 9532 Gross per 24 hour  Intake 483.57 ml  Output -  Net 483.57 ml   Weight change:  Exam:   General:  Pt is alert, follows commands appropriately, not in acute distress  HEENT: No icterus, No thrush, No neck mass, [place]/AT  Cardiovascular: RRR, S1/S2, no rubs, no gallops  Respiratory: CTA bilaterally, no wheezing, no crackles, no rhonchi  Abdomen: Soft/+BS, non tender, non distended, no guarding  Extremities: No edema, No lymphangitis, No petechiae, No rashes, no synovitis   Data Reviewed: I have personally reviewed following labs and imaging studies Basic Metabolic Panel: Recent Labs  Lab 02/23/19 2359 02/24/19 0222 02/24/19 0519  NA 139 139 140  K  2.7* 3.3* 3.3*  CL 103 106 109  CO2 26 24 24   GLUCOSE 136* 119* 138*  BUN 14 18 21   CREATININE 0.87 0.88 0.88  CALCIUM 8.7* 7.9* 7.8*  MG  --  1.9 2.1   Liver Function Tests: Recent Labs  Lab 02/23/19 2359  AST 22  ALT 12  ALKPHOS 40  BILITOT 0.7  PROT 7.3  ALBUMIN  3.9   No results for input(s): LIPASE, AMYLASE in the last 168 hours. No results for input(s): AMMONIA in the last 168 hours. Coagulation Profile: Recent Labs  Lab 02/24/19 0222  INR 1.2   CBC: Recent Labs  Lab 02/23/19 2359 02/24/19 0220 02/24/19 0519 02/24/19 0947  WBC 5.0  --   --   --   HGB 11.2* 9.1* 8.2* 8.7*  HCT 34.5* 28.3* 25.5* 28.4*  MCV 92.2  --   --   --   PLT 155  --   --   --    Cardiac Enzymes: No results for input(s): CKTOTAL, CKMB, CKMBINDEX, TROPONINI in the last 168 hours. BNP: Invalid input(s): POCBNP CBG: No results for input(s): GLUCAP in the last 168 hours. HbA1C: No results for input(s): HGBA1C in the last 72 hours. Urine analysis:    Component Value Date/Time   COLORURINE YELLOW 04/18/2018 1253   APPEARANCEUR CLEAR 04/18/2018 1253   LABSPEC 1.011 04/18/2018 1253   PHURINE 7.0 04/18/2018 1253   GLUCOSEU NEGATIVE 04/18/2018 1253   HGBUR NEGATIVE 04/18/2018 1253   BILIRUBINUR NEGATIVE 04/18/2018 1253   KETONESUR NEGATIVE 04/18/2018 1253   PROTEINUR 30 (A) 04/18/2018 1253   UROBILINOGEN 0.2 02/17/2015 1502   NITRITE NEGATIVE 04/18/2018 1253   LEUKOCYTESUR NEGATIVE 04/18/2018 1253   Sepsis Labs: @LABRCNTIP (procalcitonin:4,lacticidven:4) )No results found for this or any previous visit (from the past 240 hour(s)).   Scheduled Meds: . heparin lock flush      . sodium chloride   Intravenous Once  . [START ON 02/27/2019] pantoprazole  40 mg Intravenous Q12H  . potassium chloride  10 mEq Oral Daily   Continuous Infusions: . sodium chloride 50 mL/hr at 02/24/19 0443  . pantoprozole (PROTONIX) infusion 8 mg/hr (02/24/19 0528)    Procedures/Studies: No results found.  Catarina Hartshorn, DO  [facility] Pager [phone]  If 7PM-7AM, please contact night-coverage [email or web address] Password [email or web address] 02/24/2019, 10:05 AM   LOS: 0 days

## 2019-02-24 NOTE — ED Notes (Signed)
Patient's daughter notified that patient will be admitted. Daughter's home phone is [phone] Cell [phone]  Granddaughter [phone]

## 2019-02-24 NOTE — Consult Note (Signed)
Referring Provider: Catarina Hartshorn, DO Primary Care Physician:  [facility] Primary Gastroenterologist:  Dr. Karilyn Cota  Reason for Consultation:   Rectal bleeding  HPI:   Patient able to provide limited history which is obtained primarily from chart. I called patient's daughter Ms. Felisa Bonier but she did not answer. Patient was brought to emergency room via EMS last night for sudden onset of rectal bleeding.  Patient states she passed large amount of bright red blood per rectum.  She broke out in a sweat.  She did not experience abdominal pain nausea vomiting.  Patient is hemoglobin was 11.2 and hematocrit was 34.5.  Her BUN was 14 and creatinine 0.87.  INR was 1.2. Patient was deemed to be hemodynamically stable.  She was typed and screened and she was admitted to Dr. Don Perking service.  Rectal tube was placed. GI bleeding scan has been ordered but not she had begun yet. Patient was restless during the night and she was given 0.25 mg of clonazepam p.o. and she is sleepy.  However she is able to answer questions.  She denies abdominal pain nausea or vomiting.  She also denies chest pain or shortness of breath. She does not take aspirin or other OTC NSAIDs.  She is on p.o. iron 1 tablet daily.  H&H was 12.4 and 38.4 on 07/25/2018. Patient has history of large volume painless hematochezia back in August 2015.  She underwent colonoscopy on 07/01/2014.  Her colon was full of blood and clots and examination could not be completed or bleeding source identified.  She stopped bleeding and underwent repeat colonoscopy on 07/05/2014 which revealed colonic diverticulosis without bleeding and a large ulcer rectosigmoid junction which was felt to be the source of blood loss.  Biopsy revealed benign etiology.  I felt this ulcer was ischemic.    Patient lives at home with her daughter.  She is able to ambulate with the help of walker.    Past Medical History:  Diagnosis Date  . Arthritis    . Back pain   . Diverticulosis   . Headache   . Hypertension   . Lower GI bleeding secondary to ulcer at rectosigmoid junction   . Stroke (HCC) 04/2018   difficulty with vision in left eye from stroke    Past Surgical History:  Procedure Laterality Date  . ABDOMINAL HYSTERECTOMY    . CHOLECYSTECTOMY    . COLONOSCOPY N/A 07/01/2014   Procedure: COLONOSCOPY;  Surgeon: Malissa Hippo, MD;  Location: [facility];  Service: Endoscopy;  Laterality: N/A;  . COLONOSCOPY N/A 07/05/2014   Procedure: COLONOSCOPY;  Surgeon: Malissa Hippo, MD;  Location: [facility];  Service: Endoscopy;  Laterality: N/A;    Prior to Admission medications   Medication Sig Start Date End Date Taking? Authorizing Provider  albuterol (PROAIR HFA) 108 (90 BASE) MCG/ACT inhaler Inhale 2 puffs into the lungs every 6 (six) hours as needed. For shortness of breath   Yes [provider]  Camphor-Eucalyptus-Menthol (VICKS VAPORUB EX) Apply 1 application topically daily as needed (pain).   Yes [provider]  ferrous sulfate 325 (65 FE) MG EC tablet Take 1 tablet (325 mg total) by mouth 2 (two) times daily with a meal. Patient taking differently: Take 325 mg by mouth daily.  04/24/17  Yes Emokpae, Courage, MD  NIFEdipine (PROCARDIA-XL/ADALAT CC) 30 MG 24 hr tablet Take 1 tablet by mouth daily. 08/28/17  Yes [provider]  senna (SENOKOT) 8.6 MG TABS  tablet Take 1 tablet by mouth daily.   Yes [provider]  traMADol (ULTRAM) 50 MG tablet Take 25-50 mg by mouth 3 (three) times daily as needed for moderate pain.    Yes [provider]    Current Facility-Administered Medications  Medication Dose Route Frequency Provider Last Rate Last Dose  . 0.9 %  sodium chloride infusion (Manually program via Guardrails IV Fluids)   Intravenous Once Drema Dallas, MD   Stopped at 02/24/19 0147  . 0.9 % NaCl with KCl 20 mEq/ L  infusion   Intravenous Continuous Tat, David, MD      .  acetaminophen (TYLENOL) tablet 650 mg  650 mg Oral Q6H PRN Drema Dallas, MD       Or  . acetaminophen (TYLENOL) suppository 650 mg  650 mg Rectal Q6H PRN Drema Dallas, MD      . albuterol (PROVENTIL HFA;VENTOLIN HFA) 108 (90 Base) MCG/ACT inhaler 2 puff  2 puff Inhalation Q6H PRN Drema Dallas, MD      . clonazePAM Scarlette Calico) tablet 0.25 mg  0.25 mg Oral QHS PRN Drema Dallas, MD   0.25 mg at 02/24/19 0025  . heparin lock flush 100 UNIT/ML injection           . pantoprazole (PROTONIX) 80 mg in sodium chloride 0.9 % 250 mL (0.32 mg/mL) infusion  8 mg/hr Intravenous Continuous Drema Dallas, MD 25 mL/hr at 02/24/19 0528 8 mg/hr at 02/24/19 0528  . [START ON 02/27/2019] pantoprazole (PROTONIX) injection 40 mg  40 mg Intravenous Q12H Drema Dallas, MD      . potassium chloride (K-DUR) CR tablet 10 mEq  10 mEq Oral Daily Drema Dallas, MD   10 mEq at 02/24/19 1006  . traMADol (ULTRAM) tablet 25 mg  25 mg Oral TID PRN Drema Dallas, MD   25 mg at 02/24/19 4235    Allergies as of 02/23/2019 - Review Complete 02/23/2019  Allergen Reaction Noted  . Aspirin  10/25/2015  . Penicillins Other (See Comments) 02/11/2012    Family History  Problem Relation Age of Onset  . Hypertension Mother   . Hypertension Father   . Diabetes Other     Social History   Socioeconomic History  . Marital status: Widowed    Spouse name: Not on file  . Number of children: Not on file  . Years of education: Not on file  . Highest education level: Not on file  Occupational History  . Not on file  Social Needs  . Financial resource strain: Patient refused  . Food insecurity:    Worry: Patient refused    Inability: Patient refused  . Transportation needs:    Medical: Patient refused    Non-medical: Patient refused  Tobacco Use  . Smoking status: Never Smoker  . Smokeless tobacco: Current User    Types: Snuff  Substance and Sexual Activity  . Alcohol use: No    Alcohol/week: 0.0 standard  drinks  . Drug use: No  . Sexual activity: Never  Lifestyle  . Physical activity:    Days per week: Patient refused    Minutes per session: Patient refused  . Stress: Not on file  Relationships  . Social connections:    Talks on phone: Patient refused    Gets together: Patient refused    Attends religious service: Patient refused    Active member of club or organization: Patient refused    Attends meetings of clubs  or organizations: Patient refused    Relationship status: Patient refused  . Intimate partner violence:    Fear of current or ex partner: Patient refused    Emotionally abused: Patient refused    Physically abused: Patient refused    Forced sexual activity: Patient refused  Other Topics Concern  . Not on file  Social History Narrative  . Not on file    Review of Systems: See HPI, otherwise normal ROS  Physical Exam: Temp:  [98.1 F (36.7 C)-98.2 F (36.8 C)] 98.1 F (36.7 C) (03/27 0556) Pulse Rate:  [58-90] 58 (03/27 0556) Resp:  [17-20] 18 (03/27 0556) BP: (109-202)/(62-111) 118/62 (03/27 0556) SpO2:  [96 %-100 %] 100 % (03/27 0556) Weight:  [59 kg] 59 kg (03/26 2351) Last BM Date: 02/24/19  Elderly thin female who is drowsy but able to answer questions. Conjunctiva is pale.  Sclerae nonicteric. Oral mucosa is normal.  Dentition in fair condition. No thyromegaly or lymphadenopathy. Cardiac exam appears to be regular.  Normal S1 and S2.  No murmur gallop noted. Auscultation lungs reveal vesicular breath sounds bilaterally. Abdomen is flat.  She has right subcostal scar.  Bowel sounds are normal.  On palpation abdomen is soft and nontender with organomegaly or masses. Rectal tube is in place.  It has small amount of dark burgundy blood. She does not have LE edema.   Lab Results: Recent Labs    02/23/19 2359 02/24/19 0220 02/24/19 0519 02/24/19 0947  WBC 5.0  --   --   --   HGB 11.2* 9.1* 8.2* 8.7*  HCT 34.5* 28.3* 25.5* 28.4*  PLT 155  --   --    --    BMET Recent Labs    02/23/19 2359 02/24/19 0222 02/24/19 0519  NA 139 139 140  K 2.7* 3.3* 3.3*  CL 103 106 109  CO2 26 24 24   GLUCOSE 136* 119* 138*  BUN 14 18 21   CREATININE 0.87 0.88 0.88  CALCIUM 8.7* 7.9* 7.8*   LFT Recent Labs    02/23/19 2359  PROT 7.3  ALBUMIN 3.9  AST 22  ALT 12  ALKPHOS 40  BILITOT 0.7   PT/INR Recent Labs    02/24/19 0222  LABPROT 15.2  INR 1.2    Assessment;  Patient is 85 years old [place] American female who presents with large volume painless hematochezia.  Her hemoglobin has dropped by about 2-1/2 g.  It has leveled off.  Most recent hemoglobin is 8.7 g.  She possibly has stopped bleeding. Suspect GI bleed secondary to colonic diverticulosis or ulcer at rectosigmoid junction which was diagnosed back in August 2015. She is scheduled to undergo GI bleeding scan.  Hopefully it would point to location of bleeding and help with endoscopic therapy if indicated. She stopped bleeding on her last episode in August 2015 and hopefully she would do the same this time.  Recommendations;  H&H is as you are doing. Wait for results of GI bleeding scan. Further recommendations to follow.   LOS: 0 days   Najeeb Rehman  02/24/2019, 10:57 AM

## 2019-02-24 NOTE — ED Notes (Signed)
Patient gave verbal blood consent due to patient states that she does not write.

## 2019-02-25 DIAGNOSIS — F17220 Nicotine dependence, chewing tobacco, uncomplicated: Secondary | ICD-10-CM | POA: Diagnosis present

## 2019-02-25 DIAGNOSIS — D696 Thrombocytopenia, unspecified: Secondary | ICD-10-CM | POA: Diagnosis present

## 2019-02-25 DIAGNOSIS — I248 Other forms of acute ischemic heart disease: Secondary | ICD-10-CM | POA: Diagnosis present

## 2019-02-25 DIAGNOSIS — D62 Acute posthemorrhagic anemia: Secondary | ICD-10-CM | POA: Diagnosis not present

## 2019-02-25 DIAGNOSIS — Z880 Allergy status to penicillin: Secondary | ICD-10-CM | POA: Diagnosis not present

## 2019-02-25 DIAGNOSIS — K5790 Diverticulosis of intestine, part unspecified, without perforation or abscess without bleeding: Secondary | ICD-10-CM | POA: Diagnosis not present

## 2019-02-25 DIAGNOSIS — I4892 Unspecified atrial flutter: Secondary | ICD-10-CM | POA: Diagnosis present

## 2019-02-25 DIAGNOSIS — K922 Gastrointestinal hemorrhage, unspecified: Secondary | ICD-10-CM | POA: Diagnosis present

## 2019-02-25 DIAGNOSIS — I10 Essential (primary) hypertension: Secondary | ICD-10-CM | POA: Diagnosis not present

## 2019-02-25 DIAGNOSIS — Z66 Do not resuscitate: Secondary | ICD-10-CM | POA: Diagnosis present

## 2019-02-25 DIAGNOSIS — Z7189 Other specified counseling: Secondary | ICD-10-CM | POA: Diagnosis not present

## 2019-02-25 DIAGNOSIS — Z8249 Family history of ischemic heart disease and other diseases of the circulatory system: Secondary | ICD-10-CM | POA: Diagnosis not present

## 2019-02-25 DIAGNOSIS — K5730 Diverticulosis of large intestine without perforation or abscess without bleeding: Secondary | ICD-10-CM | POA: Diagnosis present

## 2019-02-25 DIAGNOSIS — Z79899 Other long term (current) drug therapy: Secondary | ICD-10-CM | POA: Diagnosis not present

## 2019-02-25 DIAGNOSIS — K921 Melena: Secondary | ICD-10-CM | POA: Diagnosis present

## 2019-02-25 DIAGNOSIS — K5731 Diverticulosis of large intestine without perforation or abscess with bleeding: Secondary | ICD-10-CM | POA: Diagnosis present

## 2019-02-25 DIAGNOSIS — Z7982 Long term (current) use of aspirin: Secondary | ICD-10-CM | POA: Diagnosis not present

## 2019-02-25 DIAGNOSIS — E876 Hypokalemia: Secondary | ICD-10-CM | POA: Diagnosis not present

## 2019-02-25 DIAGNOSIS — H547 Unspecified visual loss: Secondary | ICD-10-CM | POA: Diagnosis present

## 2019-02-25 DIAGNOSIS — Z8673 Personal history of transient ischemic attack (TIA), and cerebral infarction without residual deficits: Secondary | ICD-10-CM | POA: Diagnosis not present

## 2019-02-25 DIAGNOSIS — Z515 Encounter for palliative care: Secondary | ICD-10-CM | POA: Diagnosis not present

## 2019-02-25 LAB — BASIC METABOLIC PANEL
Anion gap: 7 (ref 5–15)
BUN: 21 mg/dL (ref 8–23)
CO2: 24 mmol/L (ref 22–32)
Calcium: 8 mg/dL — ABNORMAL LOW (ref 8.9–10.3)
Chloride: 112 mmol/L — ABNORMAL HIGH (ref 98–111)
Creatinine, Ser: 0.9 mg/dL (ref 0.44–1.00)
GFR calc Af Amer: 60 mL/min — ABNORMAL LOW (ref >60)
GFR calc non Af Amer: 52 mL/min — ABNORMAL LOW (ref >60)
Glucose, Bld: 88 mg/dL (ref 70–99)
Potassium: 4.5 mmol/L (ref 3.5–5.1)
SODIUM: 143 mmol/L (ref 135–145)

## 2019-02-25 LAB — HEMOGLOBIN AND HEMATOCRIT, BLOOD
HCT: 23.4 % — ABNORMAL LOW (ref 36.0–46.0)
HCT: 23.4 % — ABNORMAL LOW (ref 36.0–46.0)
HEMATOCRIT: 22.6 % — AB (ref 36.0–46.0)
HEMATOCRIT: 26.5 % — AB (ref 36.0–46.0)
Hemoglobin: 7.2 g/dL — ABNORMAL LOW (ref 12.0–15.0)
Hemoglobin: 7.1 g/dL — ABNORMAL LOW (ref 12.0–15.0)
Hemoglobin: 7.1 g/dL — ABNORMAL LOW (ref 12.0–15.0)
Hemoglobin: 8.3 g/dL — ABNORMAL LOW (ref 12.0–15.0)

## 2019-02-25 LAB — MAGNESIUM: Magnesium: 2.1 mg/dL (ref 1.7–2.4)

## 2019-02-25 MED ORDER — HYDRALAZINE HCL 20 MG/ML IJ SOLN
5.0000 mg | Freq: Once | INTRAMUSCULAR | Status: AC
  Administered 2019-02-25: 5 mg via INTRAVENOUS
  Filled 2019-02-25: qty 1

## 2019-02-25 NOTE — Progress Notes (Signed)
Hydralazine 5 mg given IV  Per order

## 2019-02-25 NOTE — Progress Notes (Signed)
Pt agitated, pulling at IV tubing, attempted to come out of bed over bed rails. Disoriented. Rambling. Haldol 2mg  IV given as ordered for agitation. Will continue to monitor.

## 2019-02-25 NOTE — Progress Notes (Signed)
Pt BP 192/89, P 94, resting quietly in bed, no distress noted. Denies HA, dizziness or SOB. Daughter at bedside. Dr. Arbutus Leas notified of elevated BP. Will monitor for changes.

## 2019-02-25 NOTE — Progress Notes (Signed)
PROGRESS NOTE  Lynn Nichols POL:[id number] DOB: [birth year] DOA: 02/23/2019 PCP: [facility] Brief History:  85 year old female with history of hypertension, lower GI bleed, stroke, diverticulosis, atrial flutter presenting with hematochezia.  The patient had 2 episodes of maroon-colored stools on 02/23/2019.  She denies any fevers, chills, chest pain, shortness breath, abdominal pain.  Remainder review of systems and history is limited secondary the patient's cognitive impairment.  Upon presentation, the patient continued to have hematochezia with loose stools.  Flexi-Seal was placed.  GI was consulted to assist with management.  The patient was typed and screened.  Hemoglobin was 11.2 at the time presentation and was trended during the hospitalization.  The patient had a colonoscopy in 2015 by Dr. Kerry Hough which revealed scattered diverticuli throughout the colon without stigmata of bleeding.  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  The patient was most recently admitted in December 2018 for lower GI bleed which was attributable to her rectosigmoid ulceration.  Assessment/Plan: Acute blood loss anemia -Patient has been typed and screened -Transfuse for hemoglobin less than 7 -GI consulted -Hgb trending down 11,2>>>9.1>>>8.2>>>7.1 -remains hemodynamically stable  Lower GI bleed -Likely diverticular bleed versus bleeding from the patient's rectosigmoid ulcer -She remains hemodynamically stable -Clear liquid diet -continues to have rectal bleeding through rectal tube -GI consulted -family does not want endoscopy at this time -transfuse for Hgb <7  Hypokalemia -Repleted -Check magnesium--2.1  ElevatedTroponin -No chest pain presently -Personally reviewed EKG--atrial flutter, nonspecific ST-T wave changes -Secondary to demand ischemia -11/18/2017 echo--EF 65-70%, no WMA, mild AI, MR   Essential hypertension -Holding nifedipine -Monitor blood pressure off antihypertensive medication  Atrial flutter -Rate controlled -Patient is not a candidate for anticoagulation secondary to her recurrent GI bleed  Goals of Care -palliative medicine consulted -DNR     Disposition Plan:   Home in 1-2 days  Family Communication:   Daughter updated on phone 3/28  Consultants:  GI--Rehman  Code Status:  DNR  DVT Prophylaxis:  SCDs   Procedures: As Listed in Progress Note Above  Antibiotics: None     Subjective: Patient denies fevers, chills, headache, chest pain, dyspnea, nausea, vomiting, diarrhea, abdominal pain, dysuria, hematuria, hematochezia, and melena.   Objective: Vitals:   02/24/19 1528 02/24/19 1700 02/24/19 2133 02/25/19 0551  BP: (!) 164/98 (!) 164/87 (!) 160/84 (!) 154/81  Pulse: 80 73 76 72  Resp: 18 20 20 18   Temp: 97.9 F (36.6 C)  97.8 F (36.6 C) 98.1 F (36.7 C)  TempSrc:   Oral Oral  SpO2: 100% (!) 88% 94% 95%  Weight:      Height:        Intake/Output Summary (Last 24 hours) at 02/25/2019 1131 Last data filed at 02/25/2019 0600 Gross per 24 hour  Intake 1104.97 ml  Output 600 ml  Net 504.97 ml   Weight change:  Exam:   General:  Pt is alert, follows commands appropriately, not in acute distress  HEENT: No icterus, No thrush, No neck mass, [place]/AT  Cardiovascular: RRR, S1/S2, no rubs, no gallops  Respiratory: bibasilar crackles, no wheeze  Abdomen: Soft/+BS, non tender, non distended, no guarding  Extremities: No edema, No lymphangitis, No petechiae, No rashes, no synovitis   Data Reviewed: I have personally reviewed following labs and imaging studies Basic Metabolic Panel: Recent Labs  Lab 02/23/19 2359 02/24/19 0222 02/24/19 0519 02/25/19 0403  NA 139 139 140 143  K 2.7* 3.3* 3.3* 4.5  CL 103 106 109 112*  CO2 26 24 24 24   GLUCOSE 136* 119* 138* 88  BUN 14 18 21 21   CREATININE 0.87 0.88 0.88  0.90  CALCIUM 8.7* 7.9* 7.8* 8.0*  MG  --  1.9 2.1 2.1   Liver Function Tests: Recent Labs  Lab 02/23/19 2359  AST 22  ALT 12  ALKPHOS 40  BILITOT 0.7  PROT 7.3  ALBUMIN 3.9   No results for input(s): LIPASE, AMYLASE in the last 168 hours. No results for input(s): AMMONIA in the last 168 hours. Coagulation Profile: Recent Labs  Lab 02/24/19 0222  INR 1.2   CBC: Recent Labs  Lab 02/23/19 2359  02/24/19 0947 02/24/19 1553 02/24/19 2109 02/25/19 0403 02/25/19 0825  WBC 5.0  --   --   --   --   --   --   HGB 11.2*   < > 8.7* 8.2* 7.6* 7.2* 7.1*  HCT 34.5*   < > 28.4* 26.7* 25.1* 23.4* 22.6*  MCV 92.2  --   --   --   --   --   --   PLT 155  --   --   --   --   --   --    < > = values in this interval not displayed.   Cardiac Enzymes: Recent Labs  Lab 02/24/19 0947 02/24/19 1553  TROPONINI <0.03 <0.03   BNP: Invalid input(s): POCBNP CBG: No results for input(s): GLUCAP in the last 168 hours. HbA1C: No results for input(s): HGBA1C in the last 72 hours. Urine analysis:    Component Value Date/Time   COLORURINE YELLOW 04/18/2018 1253   APPEARANCEUR CLEAR 04/18/2018 1253   LABSPEC 1.011 04/18/2018 1253   PHURINE 7.0 04/18/2018 1253   GLUCOSEU NEGATIVE 04/18/2018 1253   HGBUR NEGATIVE 04/18/2018 1253   BILIRUBINUR NEGATIVE 04/18/2018 1253   KETONESUR NEGATIVE 04/18/2018 1253   PROTEINUR 30 (A) 04/18/2018 1253   UROBILINOGEN 0.2 02/17/2015 1502   NITRITE NEGATIVE 04/18/2018 1253   LEUKOCYTESUR NEGATIVE 04/18/2018 1253   Sepsis Labs: @LABRCNTIP (procalcitonin:4,lacticidven:4) )No results found for this or any previous visit (from the past 240 hour(s)).   Scheduled Meds: . [START ON 02/27/2019] pantoprazole  40 mg Intravenous Q12H   Continuous Infusions: . pantoprozole (PROTONIX) infusion 8 mg/hr (02/25/19 1119)    Procedures/Studies: Nm Gi Blood Loss  Result Date: 02/24/2019 CLINICAL DATA:  GI bleed EXAM: NUCLEAR MEDICINE GASTROINTESTINAL BLEEDING  SCAN TECHNIQUE: Sequential abdominal images were obtained following intravenous administration of Tc-3m labeled red blood cells. RADIOPHARMACEUTICALS:  25 mCi Tc-77m pertechnetate in-vitro labeled red cells. COMPARISON:  None FINDINGS: Nonstandard imaging due to patient condition and ability to cooperate. Majority of exam performed in varying degrees of LPO positioning. Images obtained intermittently for 1 hour. Normal blood pool distribution of tracer. No definite abnormal gastrointestinal localization of tracer seen. IMPRESSION: No definite evidence of active GI bleeding. Electronically Signed   By: Ulyses Southward M.D.   On: 02/24/2019 12:52    Catarina Hartshorn, DO  [facility] Pager [phone]  If 7PM-7AM, please contact night-coverage [email or web address] Password [facility] 02/25/2019, 11:31 AM   LOS: 0 days

## 2019-02-26 LAB — BASIC METABOLIC PANEL
Anion gap: 9 (ref 5–15)
BUN: 13 mg/dL (ref 8–23)
CHLORIDE: 107 mmol/L (ref 98–111)
CO2: 24 mmol/L (ref 22–32)
Calcium: 8.7 mg/dL — ABNORMAL LOW (ref 8.9–10.3)
Creatinine, Ser: 0.87 mg/dL (ref 0.44–1.00)
GFR calc Af Amer: >60 mL/min (ref >60)
GFR calc non Af Amer: 54 mL/min — ABNORMAL LOW (ref >60)
Glucose, Bld: 115 mg/dL — ABNORMAL HIGH (ref 70–99)
Potassium: 3.7 mmol/L (ref 3.5–5.1)
SODIUM: 140 mmol/L (ref 135–145)

## 2019-02-26 LAB — CBC
HCT: 25 % — ABNORMAL LOW (ref 36.0–46.0)
Hemoglobin: 7.8 g/dL — ABNORMAL LOW (ref 12.0–15.0)
MCH: 29.1 pg (ref 26.0–34.0)
MCHC: 31.2 g/dL (ref 30.0–36.0)
MCV: 93.3 fL (ref 80.0–100.0)
Platelets: 115 10*3/uL — ABNORMAL LOW (ref 150–400)
RBC: 2.68 MIL/uL — ABNORMAL LOW (ref 3.87–5.11)
RDW: 14.6 % (ref 11.5–15.5)
WBC: 5.6 10*3/uL (ref 4.0–10.5)
nRBC: 0 % (ref 0.0–0.2)

## 2019-02-26 MED ORDER — PANTOPRAZOLE SODIUM 40 MG PO TBEC
40.0000 mg | DELAYED_RELEASE_TABLET | Freq: Every day | ORAL | Status: DC
  Administered 2019-02-26 – 2019-02-28 (×3): 40 mg via ORAL
  Filled 2019-02-26 (×3): qty 1

## 2019-02-26 MED ORDER — NIFEDIPINE ER OSMOTIC RELEASE 30 MG PO TB24
30.0000 mg | ORAL_TABLET | Freq: Every day | ORAL | Status: DC
  Administered 2019-02-26 – 2019-02-28 (×3): 30 mg via ORAL
  Filled 2019-02-26 (×3): qty 1

## 2019-02-26 NOTE — Progress Notes (Signed)
Notified by nurse tech that flexiseal had came out. Upon assessment flexiseal lying in the bed. Midlevel notified. Ordered to keep flexiseal out at this time.

## 2019-02-26 NOTE — Progress Notes (Signed)
PROGRESS NOTE  Lynn Nichols BJS:[id number] DOB: [birth year] DOA: 02/23/2019 PCP: [facility]  Brief History: 85 year old female with history of hypertension, lower GI bleed, stroke, diverticulosis, atrial flutter presenting with hematochezia. The patient had 2 episodes of maroon-colored stools on 02/23/2019. She denies any fevers, chills, chest pain, shortness breath, abdominal pain. Remainder review of systems and history is limited secondary the patient's cognitive impairment. Upon presentation, the patient continued to have hematochezia with loose stools. Flexi-Seal was placed. GI was consulted to assist with management. The patient was typed and screened. Hemoglobin was 11.2 at the time presentation and was trended during the hospitalization. The patient had a colonoscopy in 2015 by Dr. Kerry Hough which revealed scattered diverticuli throughout the colon without stigmata of bleeding. 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 The patient was most recently admitted in December 2018 for lower GI bleed which was attributable to her rectosigmoid ulceration.  Assessment/Plan: Acute blood loss anemia -Patient has been typed and screened -Transfuse for hemoglobin less than 7 -GI consulted -Hgb trending down 11,2>>>9.1>>>8.2>>>7.1>>7.8 -remains hemodynamically stable  Lower GI bleed -Likely diverticular bleed versus bleeding from the patient's rectosigmoid ulcer -She remains hemodynamically stable -Clear liquid diet>>advance to soft diet -continues to have rectal bleeding through rectal tube but decreasing -GI consulted -family does not want endoscopy at this time -transfuse for Hgb <7  Hypokalemia -Repleted -Check magnesium--2.1  ElevatedTroponin -No chest pain presently -Personally reviewed EKG--atrial flutter, nonspecific ST-T wave changes -Secondary to demand ischemia  -11/18/2017 echo--EF 65-70%, no WMA, mild AI, MR  Essential hypertension -Holding nifedipine -Monitor blood pressure off antihypertensive medication  Atrial flutter -Rate controlled -Patient is not a candidate for anticoagulation secondary to her recurrent GI bleed  Thrombocytopenia -has been intermittent in the past -serum B12 -folic acid  Goals of Care -palliative medicine consulted -DNR     Disposition Plan: Home 3/30 if stable Family Communication: Daughter updated on phone 3/29  Consultants:GI--Rehman  Code Status:DNR  DVT Prophylaxis: SCDs   Procedures: As Listed in Progress Note Above  Antibiotics: None   Subjective: Patient denies fevers, chills, headache, chest pain, dyspnea, nausea, vomiting, abdominal pain,  hematuria, hematochezia   Objective: Vitals:   02/25/19 1600 02/25/19 1800 02/25/19 2146 02/26/19 0700  BP: (!) 170/66 (!) 192/89 (!) 162/73 (!) 168/87  Pulse:  94 (!) 131 88  Resp:   18 20  Temp:   97.6 F (36.4 C) 98.3 F (36.8 C)  TempSrc:   Oral Oral  SpO2: 98% 96% 100% 100%  Weight:      Height:        Intake/Output Summary (Last 24 hours) at 02/26/2019 1041 Last data filed at 02/26/2019 0303 Gross per 24 hour  Intake -  Output 1800 ml  Net -1800 ml   Weight change:  Exam:   General:  Pt is alert, follows commands appropriately, not in acute distress  HEENT: No icterus, No thrush, No neck mass, [place]/AT  Cardiovascular: IRRR, S1/S2, no rubs, no gallops  Respiratory: bibasilar rales. No wheeze  Abdomen: Soft/+BS, non tender, non distended, no guarding  Extremities: trace LE edema, No lymphangitis, No petechiae, No rashes, no synovitis   Data Reviewed: I have personally reviewed following labs and imaging studies Basic Metabolic Panel: Recent Labs  Lab 02/23/19 2359 02/24/19 0222 02/24/19 0519 02/25/19 0403 02/26/19 0514  NA 139 139 140 143 140  K 2.7*  3.3* 3.3* 4.5 3.7  CL 103 106 109  112* 107  CO2 26 24 24 24 24   GLUCOSE 136* 119* 138* 88 115*  BUN 14 18 21 21 13   CREATININE 0.87 0.88 0.88 0.90 0.87  CALCIUM 8.7* 7.9* 7.8* 8.0* 8.7*  MG  --  1.9 2.1 2.1  --    Liver Function Tests: Recent Labs  Lab 02/23/19 2359  AST 22  ALT 12  ALKPHOS 40  BILITOT 0.7  PROT 7.3  ALBUMIN 3.9   No results for input(s): LIPASE, AMYLASE in the last 168 hours. No results for input(s): AMMONIA in the last 168 hours. Coagulation Profile: Recent Labs  Lab 02/24/19 0222  INR 1.2   CBC: Recent Labs  Lab 02/23/19 2359  02/25/19 0403 02/25/19 0825 02/25/19 1204 02/25/19 2037 02/26/19 0514  WBC 5.0  --   --   --   --   --  5.6  HGB 11.2*   < > 7.2* 7.1* 7.1* 8.3* 7.8*  HCT 34.5*   < > 23.4* 22.6* 23.4* 26.5* 25.0*  MCV 92.2  --   --   --   --   --  93.3  PLT 155  --   --   --   --   --  115*   < > = values in this interval not displayed.   Cardiac Enzymes: Recent Labs  Lab 02/24/19 0947 02/24/19 1553  TROPONINI <0.03 <0.03   BNP: Invalid input(s): POCBNP CBG: No results for input(s): GLUCAP in the last 168 hours. HbA1C: No results for input(s): HGBA1C in the last 72 hours. Urine analysis:    Component Value Date/Time   COLORURINE YELLOW 04/18/2018 1253   APPEARANCEUR CLEAR 04/18/2018 1253   LABSPEC 1.011 04/18/2018 1253   PHURINE 7.0 04/18/2018 1253   GLUCOSEU NEGATIVE 04/18/2018 1253   HGBUR NEGATIVE 04/18/2018 1253   BILIRUBINUR NEGATIVE 04/18/2018 1253   KETONESUR NEGATIVE 04/18/2018 1253   PROTEINUR 30 (A) 04/18/2018 1253   UROBILINOGEN 0.2 02/17/2015 1502   NITRITE NEGATIVE 04/18/2018 1253   LEUKOCYTESUR NEGATIVE 04/18/2018 1253   Sepsis Labs: @LABRCNTIP (procalcitonin:4,lacticidven:4) )No results found for this or any previous visit (from the past 240 hour(s)).   Scheduled Meds: . [START ON 02/27/2019] pantoprazole  40 mg Intravenous Q12H   Continuous Infusions:  Procedures/Studies: Nm Gi Blood Loss  Result Date: 02/24/2019 CLINICAL  DATA:  GI bleed EXAM: NUCLEAR MEDICINE GASTROINTESTINAL BLEEDING SCAN TECHNIQUE: Sequential abdominal images were obtained following intravenous administration of Tc-66m labeled red blood cells. RADIOPHARMACEUTICALS:  25 mCi Tc-21m pertechnetate in-vitro labeled red cells. COMPARISON:  None FINDINGS: Nonstandard imaging due to patient condition and ability to cooperate. Majority of exam performed in varying degrees of LPO positioning. Images obtained intermittently for 1 hour. Normal blood pool distribution of tracer. No definite abnormal gastrointestinal localization of tracer seen. IMPRESSION: No definite evidence of active GI bleeding. Electronically Signed   By: Ulyses Southward M.D.   On: 02/24/2019 12:52    Catarina Hartshorn, DO  [facility] Pager [phone]  If 7PM-7AM, please contact night-coverage [email or web address] Password [email or web address] 02/26/2019, 10:41 AM   LOS: 1 day

## 2019-02-27 ENCOUNTER — Encounter (HOSPITAL_COMMUNITY): Payer: Self-pay | Admitting: Primary Care

## 2019-02-27 DIAGNOSIS — Z7189 Other specified counseling: Secondary | ICD-10-CM

## 2019-02-27 LAB — CBC
HCT: 22.5 % — ABNORMAL LOW (ref 36.0–46.0)
Hemoglobin: 7.2 g/dL — ABNORMAL LOW (ref 12.0–15.0)
MCH: 29.5 pg (ref 26.0–34.0)
MCHC: 32 g/dL (ref 30.0–36.0)
MCV: 92.2 fL (ref 80.0–100.0)
Platelets: 116 10*3/uL — ABNORMAL LOW (ref 150–400)
RBC: 2.44 MIL/uL — ABNORMAL LOW (ref 3.87–5.11)
RDW: 14.5 % (ref 11.5–15.5)
WBC: 5.3 10*3/uL (ref 4.0–10.5)
nRBC: 0 % (ref 0.0–0.2)

## 2019-02-27 LAB — VITAMIN B12: Vitamin B-12: 501 pg/mL (ref 180–914)

## 2019-02-27 LAB — FOLATE: Folate: 16.8 ng/mL (ref >5.9)

## 2019-02-27 NOTE — Progress Notes (Signed)
Patient confused and attempting to climb out of bed. Sitter at bedside with patient. IV site removed by patient. Mid Level notified and instructed ok to leave IV site out tonight.

## 2019-02-27 NOTE — Progress Notes (Signed)
PROGRESS NOTE  Lynn Nichols AST:[id number] DOB: 08/06/1914 DOA: 02/23/2019 PCP: [facility]  Brief History: 85 year old female with history of hypertension, lower GI bleed, stroke, diverticulosis, atrial flutter presenting with hematochezia. The patient had 2 episodes of maroon-colored stools on 02/23/2019. She denies any fevers, chills, chest pain, shortness breath, abdominal pain. Remainder review of systems and history is limited secondary the patient's cognitive impairment. Upon presentation, the patient continued to have hematochezia with loose stools. Flexi-Seal was placed. GI was consulted to assist with management. The patient was typed and screened. Hemoglobin was 11.2 at the time presentation and was trended during the hospitalization. The patient had a colonoscopy in 2015 by Dr. Kerry Hough which revealed scattered diverticuli throughout the colon without stigmata of bleeding. 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 The patient was most recently admitted in December 2018 for lower GI bleed which was attributable to her rectosigmoid ulceration.  Assessment/Plan: Acute blood loss anemia -Patient has been typed and screened -Transfuse for hemoglobin less than 7 -GI consult appreciated -Hgb trending down 11,2>>>9.1>>>8.2>>>7.1>>7.8 -remains hemodynamically stable  Lower GI bleed -Likely diverticular bleed versus bleeding from the patient's rectosigmoid ulcer -She remains hemodynamically stable -Clear liquid diet>>advance to soft diet -no bloody BMs since rectal tube dislodged/removed -GI consulted -family does not want endoscopy at this time -transfuse for Hgb <7 -3/30--case discussed with Dr. Coralie Carpen d/c until 3/31 to ensure Hgb stable  Hypokalemia -Repleted -Check magnesium--2.1  ElevatedTroponin -No chest pain presently -Personally reviewed EKG--atrial  flutter, nonspecific ST-T wave changes -Secondary to demand ischemia -11/18/2017 echo--EF 65-70%, no WMA, mild AI, MR  Essential hypertension -Holding nifedipine -Monitor blood pressure off antihypertensive medication  Atrial flutter -Rate controlled -Patient is not a candidate for anticoagulation secondary to her recurrent GI bleed  Thrombocytopenia -has been intermittent in the past -serum B12--501 -folic acid--16.8  Goals of Care -palliative medicine consulted -DNR     Disposition Plan: Home 3/31 if stable Family Communication:Daughterupdated on phone 3/29  Consultants:GI--Rehman  Code Status:DNR  DVT Prophylaxis: SCDs   Procedures: As Listed in Progress Note Above  Antibiotics: None   Subjective: Patient denies fevers, chills, headache, chest pain, dyspnea, nausea, vomiting, diarrhea, abdominal pain,    Objective: Vitals:   02/26/19 1441 02/26/19 2121 02/27/19 0605 02/27/19 0609  BP: (!) 158/66 100/70 (!) 129/111 (!) 152/75  Pulse: 84 85 94 71  Resp: 18 18 17    Temp: (!) 97.4 F (36.3 C) 97.7 F (36.5 C) 97.6 F (36.4 C)   TempSrc: Axillary Axillary    SpO2: 100% 100% 100%   Weight:      Height:        Intake/Output Summary (Last 24 hours) at 02/27/2019 1258 Last data filed at 02/27/2019 0806 Gross per 24 hour  Intake 780 ml  Output 1200 ml  Net -420 ml   Weight change:  Exam:   General:  Pt is alert, follows commands appropriately, not in acute distress  HEENT: No icterus, No thrush, No neck mass, /AT  Cardiovascular: RRR, S1/S2, no rubs, no gallops  Respiratory:fine bibasilar crackles. No wheeze  Abdomen: Soft/+BS, non tender, non distended, no guarding  Extremities: No edema, No lymphangitis, No petechiae, No rashes, no synovitis   Data Reviewed: I have personally reviewed following labs and imaging studies Basic Metabolic Panel: Recent Labs  Lab 02/23/19 2359 02/24/19 0222 02/24/19 9892  02/25/19 0403 02/26/19 1194  NA 139 139 140 143 140  K 2.7* 3.3* 3.3* 4.5 3.7  CL 103 106 109 112* 107  CO2 26 24 24 24 24   GLUCOSE 136* 119* 138* 88 115*  BUN 14 18 21 21 13   CREATININE 0.87 0.88 0.88 0.90 0.87  CALCIUM 8.7* 7.9* 7.8* 8.0* 8.7*  MG  --  1.9 2.1 2.1  --    Liver Function Tests: Recent Labs  Lab 02/23/19 2359  AST 22  ALT 12  ALKPHOS 40  BILITOT 0.7  PROT 7.3  ALBUMIN 3.9   No results for input(s): LIPASE, AMYLASE in the last 168 hours. No results for input(s): AMMONIA in the last 168 hours. Coagulation Profile: Recent Labs  Lab 02/24/19 0222  INR 1.2   CBC: Recent Labs  Lab 02/23/19 2359  02/25/19 0825 02/25/19 1204 02/25/19 2037 02/26/19 0514 02/27/19 0555  WBC 5.0  --   --   --   --  5.6 5.3  HGB 11.2*   < > 7.1* 7.1* 8.3* 7.8* 7.2*  HCT 34.5*   < > 22.6* 23.4* 26.5* 25.0* 22.5*  MCV 92.2  --   --   --   --  93.3 92.2  PLT 155  --   --   --   --  115* 116*   < > = values in this interval not displayed.   Cardiac Enzymes: Recent Labs  Lab 02/24/19 0947 02/24/19 1553  TROPONINI <0.03 <0.03   BNP: Invalid input(s): POCBNP CBG: No results for input(s): GLUCAP in the last 168 hours. HbA1C: No results for input(s): HGBA1C in the last 72 hours. Urine analysis:    Component Value Date/Time   COLORURINE YELLOW 04/18/2018 1253   APPEARANCEUR CLEAR 04/18/2018 1253   LABSPEC 1.011 04/18/2018 1253   PHURINE 7.0 04/18/2018 1253   GLUCOSEU NEGATIVE 04/18/2018 1253   HGBUR NEGATIVE 04/18/2018 1253   BILIRUBINUR NEGATIVE 04/18/2018 1253   KETONESUR NEGATIVE 04/18/2018 1253   PROTEINUR 30 (A) 04/18/2018 1253   UROBILINOGEN 0.2 02/17/2015 1502   NITRITE NEGATIVE 04/18/2018 1253   LEUKOCYTESUR NEGATIVE 04/18/2018 1253   Sepsis Labs: @LABRCNTIP (procalcitonin:4,lacticidven:4) )No results found for this or any previous visit (from the past 240 hour(s)).   Scheduled Meds: . NIFEdipine  30 mg Oral Daily  . pantoprazole  40 mg Oral Daily    Continuous Infusions:  Procedures/Studies: Nm Gi Blood Loss  Result Date: 02/24/2019 CLINICAL DATA:  GI bleed EXAM: NUCLEAR MEDICINE GASTROINTESTINAL BLEEDING SCAN TECHNIQUE: Sequential abdominal images were obtained following intravenous administration of Tc-23m labeled red blood cells. RADIOPHARMACEUTICALS:  25 mCi Tc-77m pertechnetate in-vitro labeled red cells. COMPARISON:  None FINDINGS: Nonstandard imaging due to patient condition and ability to cooperate. Majority of exam performed in varying degrees of LPO positioning. Images obtained intermittently for 1 hour. Normal blood pool distribution of tracer. No definite abnormal gastrointestinal localization of tracer seen. IMPRESSION: No definite evidence of active GI bleeding. Electronically Signed   By: Ulyses Southward M.D.   On: 02/24/2019 12:52    Catarina Hartshorn, DO  [facility] Pager [phone]  If 7PM-7AM, please contact night-coverage [email or web address] Password [email or web address] 02/27/2019, 12:58 PM   LOS: 2 days

## 2019-02-27 NOTE — Consult Note (Signed)
Consultation Note Date: 02/27/2019   Patient Name: Lynn Nichols  DOB: 04-24-1914  MRN: [id number]  Age / Sex: 85 y.o., female  PCP: [facility] Referring Physician: Catarina Hartshorn, MD  Reason for Consultation: Establishing goals of care and Psychosocial/spiritual support  HPI/Patient Profile: 85 y.o. female  with past medical history of diverticulosis, lower GIB, HTN, arthritis and back pain, HA, history of stroke with poor vision L eye, admitted on 02/23/2019 with acute lower GI B.   Clinical Assessment and Goals of Care: Lynn Nichols is resting quietly in bed.  She will answer my questions, but appears to be sleepy and does not open her eyes.  There is a sitter at bedside as she has tried to get out of bed unaided this morning.  No family at bedside at this time due to visitor restrictions. Lynn Nichols is calm and cooperative, not fearful. She is able to make her needs known.   Call to daughter/HCPOA Lynn Nichols tells me that she would like for her mother to be discharged today, but later states she is ok with Lynn Nichols staying another night.  Lynn Nichols tells me that she worries about Lynn Nichols wanting to return home.  Lynn Nichols shares that she usually spends the night with her mother when she is hospitalized and the restriction has been difficult for her.  We talk about at home needs, none noted at this time.       Conference with hospitalist and nursing staff related to patient condition and needs.     HCPOA    NEXT OF KIN- HCPOA daughter Lynn Nichols.    SUMMARY OF RECOMMENDATIONS   Continue to treat the treatable, but no invasive testing.   Code Status/Advance Care Planning:  DNR  Symptom Management:   Per hospitalist, no additional needs at this time.   Palliative Prophylaxis:   Oral Care  Additional Recommendations (Limitations, Scope,  Preferences):  treat the treatable, no cpr no intubation   Psycho-social/Spiritual:   Desire for further Chaplaincy support:no  Additional Recommendations: Caregiving  Support/Resources and Education on Hospice  Prognosis:   Unable to determine, based on outcomes, 6 months or less would not be surprising based to GIB with out desire for further invasive testing, advanced age.   Discharge Planning: Home with HH expected.       Primary Diagnoses: Present on Admission: . Essential hypertension . Acute lower gastrointestinal bleeding . Diverticulosis . Acute blood loss anemia . Hypokalemia . GI bleed . Lower GI bleed . GIB (gastrointestinal bleeding)   I have reviewed the medical record, interviewed the patient and family, and examined the patient. The following aspects are pertinent.  Past Medical History:  Diagnosis Date  . Arthritis   . Back pain   . Diverticulosis   . Headache   . Hypertension   . Lower GI bleeding   . 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 Marital  status: Widowed    Spouse name: Not on file  . Number of children: Not on file  . Years of education: Not on file  . Highest education level: Not on file  Occupational History  . Not on file  Social Needs  . Financial resource strain: Patient refused  . Food insecurity:    Worry: Patient refused    Inability: Patient refused  . Transportation needs:    Medical: Patient refused    Non-medical: Patient refused  Tobacco Use  . Smoking status: Never Smoker  . Smokeless tobacco: Current User    Types: Snuff  Substance and Sexual Activity  . Alcohol use: No    Alcohol/week: 0.0 standard drinks  . Drug use: No  . Sexual activity: Never  Lifestyle  . Physical activity:    Days per week: Patient refused    Minutes per session: Patient refused  . Stress: Not on file  Relationships  . Social connections:    Talks on  phone: Patient refused    Gets together: Patient refused    Attends religious service: Patient refused    Active member of club or organization: Patient refused    Attends meetings of clubs or organizations: Patient refused    Relationship status: Patient refused  Other Topics Concern  . Not on file  Social History Narrative  . Not on file   Family History  Problem Relation Age of Onset  . Hypertension Mother   . Hypertension Father   . Diabetes Other    Scheduled Meds: . NIFEdipine  30 mg Oral Daily  . pantoprazole  40 mg Oral Daily   Continuous Infusions: PRN Meds:.acetaminophen **OR** acetaminophen, albuterol, clonazePAM, haloperidol lactate, traMADol Medications Prior to Admission:  Prior to Admission medications   Medication Sig Start Date End Date Taking? Authorizing Provider  albuterol (PROAIR HFA) 108 (90 BASE) MCG/ACT inhaler Inhale 2 puffs into the lungs every 6 (six) hours as needed. For shortness of breath   Yes [provider]  Camphor-Eucalyptus-Menthol (VICKS VAPORUB EX) Apply 1 application topically daily as needed (pain).   Yes [provider]  ferrous sulfate 325 (65 FE) MG EC tablet Take 1 tablet (325 mg total) by mouth 2 (two) times daily with a meal. Patient taking differently: Take 325 mg by mouth daily.  04/24/17  Yes Emokpae, Courage, MD  NIFEdipine (PROCARDIA-XL/ADALAT CC) 30 MG 24 hr tablet Take 1 tablet by mouth daily. 08/28/17  Yes [provider]  senna (SENOKOT) 8.6 MG TABS tablet Take 1 tablet by mouth daily.   Yes [provider]  traMADol (ULTRAM) 50 MG tablet Take 25-50 mg by mouth 3 (three) times daily as needed for moderate pain.    Yes [provider]   Allergies  Allergen Reactions  . Aspirin     Advised by MD not take due to bleed  . Penicillins Other (See Comments)    Has patient had a PCN reaction causing immediate rash, facial/tongue/throat swelling, SOB or lightheadedness with hypotension: No  Has patient had a PCN reaction causing severe rash involving mucus membranes or skin necrosis: No Has patient had a PCN reaction that required hospitalization YES Has patient had a PCN reaction occurring within the last 10 years: No If all of the above answers are "NO", then may proceed with Cephalosporin use. "PASSES OUT"   Review of Systems  Unable to perform ROS: Age    Physical Exam Vitals signs and nursing note reviewed.  Constitutional:  Comments: Sleepy but responds to questions appropriately, does not open eyes.   HENT:     Head: Atraumatic.  Cardiovascular:     Rate and Rhythm: Normal rate.  Pulmonary:     Effort: Pulmonary effort is normal. No respiratory distress.  Abdominal:     General: There is no distension.     Palpations: Abdomen is soft.  Skin:    General: Skin is warm and dry.  Neurological:     Comments: Orientation questions not asked   Psychiatric:        Mood and Affect: Mood normal.        Behavior: Behavior normal.     Comments: Calm, not fearful at this time.      Vital Signs: BP (!) 152/75   Pulse 71   Temp 97.6 F (36.4 C)   Resp 17   Ht 5\' 2"  (1.575 m)   Wt 59 kg   SpO2 100%   BMI 23.78 kg/m  Pain Scale: PAINAD POSS *See Group Information*: 1-Acceptable,Awake and alert Pain Score: 0-No pain   SpO2: SpO2: 100 % O2 Device:SpO2: 100 % O2 Flow Rate: .O2 Flow Rate (L/min): 2 L/min  IO: Intake/output summary:   Intake/Output Summary (Last 24 hours) at 02/27/2019 1125 Last data filed at 02/27/2019 0806 Gross per 24 hour  Intake 780 ml  Output 1200 ml  Net -420 ml    LBM: Last BM Date: 02/26/19 Baseline Weight: Weight: 59 kg Most recent weight: Weight: 59 kg     Palliative Assessment/Data:   Flowsheet Rows     Most Recent Value  Intake Tab  Referral Department  Hospitalist  Unit at Time of Referral  Med/Surg Unit  Palliative Care Primary Diagnosis  -- [GIB]  Date Notified  02/25/19  Palliative Care Type  Return  patient Palliative Care  Reason for referral  Clarify Goals of Care  Date of Admission  02/25/19  Date first seen by Palliative Care  02/27/19  # of days Palliative referral response time  2 Day(s)  # of days IP prior to Palliative referral  0  Clinical Assessment  Palliative Performance Scale Score  30%  Pain Max last 24 hours  Not able to report  Pain Min Last 24 hours  Not able to report  Dyspnea Max Last 24 Hours  Not able to report  Dyspnea Min Last 24 hours  Not able to report  Psychosocial & Spiritual Assessment  Palliative Care Outcomes      Time In: 1040 Time Out: 1120  Time Total: 40 minutes  Greater than 50%  of this time was spent counseling and coordinating care related to the above assessment and plan.  Signed by: Katheran Awe, NP   Please contact Palliative Medicine Team phone at [phone] for questions and concerns.  For individual provider: See Loretha Stapler

## 2019-02-27 NOTE — Progress Notes (Signed)
  Subjective:  Patient complains of mild cramping across lower abdomen.  She denies nausea vomiting.  Objective: Blood pressure (!) 152/75, pulse 71, temperature 97.6 F (36.4 C), resp. rate 17, height 5' 2" (1.575 m), weight 59 kg, SpO2 100 %. Patient is alert.  She keeps her eyes closed. She has nursing aide in her room. Abdomen is symmetrical.  Bowel sounds are normal.  On palpation is soft and nontender with organomegaly or masses.  Labs/studies Results:  CBC Latest Ref Rng & Units 02/27/2019 02/26/2019 02/25/2019  WBC 4.0 - 10.5 K/uL 5.3 5.6 -  Hemoglobin 12.0 - 15.0 g/dL 7.2(L) 7.8(L) 8.3(L)  Hematocrit 36.0 - 46.0 % 22.5(L) 25.0(L) 26.5(L)  Platelets 150 - 400 K/uL 116(L) 115(L) -    CMP Latest Ref Rng & Units 02/26/2019 02/25/2019 02/24/2019  Glucose 70 - 99 mg/dL 115(H) 88 138(H)  BUN 8 - 23 mg/dL _0 Creatinine 0.44 - 1.00 mg/dL 0.87 0.90 0.88  Sodium 135 - 145 mmol/L 140 143 140  Potassium 3.5 - 5.1 mmol/L 3.7 4.5 3.3(L)  Chloride 98 - 111 mmol/L 107 112(H) 109  CO2 22 - 32 mmol/L _1 Calcium 8.9 - 10.3 mg/dL 8.7(L) 8.0(L) 7.8(L)  Total Protein 6.5 - 8.1 g/dL - - -  Total Bilirubin 0.3 - 1.2 mg/dL - - -  Alkaline Phos 38 - 126 U/L - - -  AST 15 - 41 U/L - - -  ALT 0 - 44 U/L - - -    Hepatic Function Latest Ref Rng & Units 02/23/2019 04/18/2018 01/25/2018  Total Protein 6.5 - 8.1 g/dL 7.3 7.7 7.7  Albumin 3.5 - 5.0 g/dL 3.9 3.8 3.9  AST 15 - 41 U/L _2 ALT 0 - 44 U/L 12 12(L) 14  Alk Phosphatase 38 - 126 U/L 40 44 48  Total Bilirubin 0.3 - 1.2 mg/dL 0.7 0.8 0.4     Assessment:  #1.  Rectal bleeding either secondary to diverticulosis or ulceration at the rectosigmoid junction which she has history of.  Patient has stopped bleeding.  She has not had a bowel movement in over 12 hours.  #2.  Anemia secondary to GI bleed.  Hemoglobin is down to 7.2 g.  Hemoglobin has dropped by 4 g.  She has not been transfused yet.  Recommendations:  Will monitor  patient for another 24 hours.  If she remains stable she could be discharged tomorrow.

## 2019-02-27 NOTE — Care Management Important Message (Signed)
Important Message  Patient Details  Name: Lynn Nichols MRN: [id number] Date of Birth: [birth year]   Medicare Important Message Given:  Yes    Corey Harold 02/27/2019, 3:05 PM

## 2019-02-28 LAB — CBC
HCT: 23.7 % — ABNORMAL LOW (ref 36.0–46.0)
Hemoglobin: 7.3 g/dL — ABNORMAL LOW (ref 12.0–15.0)
MCH: 28.9 pg (ref 26.0–34.0)
MCHC: 30.8 g/dL (ref 30.0–36.0)
MCV: 93.7 fL (ref 80.0–100.0)
Platelets: 134 10*3/uL — ABNORMAL LOW (ref 150–400)
RBC: 2.53 MIL/uL — ABNORMAL LOW (ref 3.87–5.11)
RDW: 14.6 % (ref 11.5–15.5)
WBC: 4.7 10*3/uL (ref 4.0–10.5)
nRBC: 0 % (ref 0.0–0.2)

## 2019-02-28 NOTE — Discharge Summary (Signed)
Physician Discharge Summary  Lynn Nichols WER:[id number] DOB: 11-Sep-1914 DOA: 02/23/2019  PCP: [facility]  Admit date: 02/23/2019 Discharge date: 02/28/2019  Admitted From: Home Disposition:  Home   Recommendations for Outpatient Follow-up:  1. Follow up with PCP in 1-2 weeks 2. Please obtain BMP/CBC in one week    Discharge Condition: Stable CODE STATUS: FULL Diet recommendation: Regular   Brief/Interim Summary: 85 year old female with history of hypertension, lower GI bleed, stroke, diverticulosis, atrial flutter presenting with hematochezia. The patient had 2 episodes of maroon-colored stools on 02/23/2019. She denies any fevers, chills, chest pain, shortness breath, abdominal pain. Remainder review of systems and history is limited secondary the patient's cognitive impairment. Upon presentation, the patient continued to have hematochezia with loose stools. Flexi-Seal was placed. GI was consulted to assist with management. The patient was typed and screened. Hemoglobin was 11.2 at the time presentation and was trended during the hospitalization. The patient had a colonoscopy in 2015 by Dr. Kerry Hough which revealed scattered diverticuli throughout the colon without stigmata of bleeding. 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 The patient was most recently admitted in December 2018 for lower GI bleed which was attributable to her rectosigmoid ulceration.  During this hospital admission, the patient have loose bloody stools requiring a flexiseal.  Ultimately, her blood stools decreased daily and they had more consistency.  Her Flexiseal, ultimately "fell out".  She subsequently had a soft non-bloody stool prior to her d/c.  Her Hgb dropped to the low 7s, but remained stable thereafter.  She remained hemodynamically stable.  GI was consulted and offered endoscopy, but the pt's daughters did not  want any invasive procedures.  The patient's diet was advanced which she tolerated.  Palliative medicine was consulted and spoke with the family-->continue to treat the treatable without invasive procedures.    Discharge Diagnoses:  Acute blood loss anemia -Patient has been typed and screened -Transfuse for hemoglobin less than 7 -GI consult appreciated -Hgb trending down 11,2>>>9.1>>>8.2>>>7.1>>7.8>>7.3 -remains hemodynamically stable -Hgb has remained stable after initial drop  Lower GI bleed -Likely diverticular bleed versus bleeding from the patient's rectosigmoid ulcer -She remains hemodynamically stable -Clear liquid diet>>advance to soft diet -no bloody BMs since rectal tube dislodged/removed -GI consulted -family does not want endoscopy at this time -transfuse for Hgb <7 -pt had brown soft BM without blood in the 24 hours prior to d/c  Hypokalemia -Repleted -Check magnesium--2.1  ElevatedTroponin -No chest pain presently -Personally reviewed EKG--atrial flutter, nonspecific ST-T wave changes -Secondary to demand ischemia -11/18/2017 echo--EF 65-70%, no WMA, mild AI, MR  Essential hypertension -restarted nifidipine since BP gradually increasing  Atrial flutter -Rate controlled -Patient is not a candidate for anticoagulation secondary to her recurrent GI bleed  Thrombocytopenia -has been intermittent in the past -serum B12--501 -folic acid--16.8  Goals of Care -palliative medicine consulted -DNR   Discharge Instructions   Allergies as of 02/28/2019      Reactions   Aspirin    Advised by MD not take due to bleed   Penicillins Other (See Comments)   Has patient had a PCN reaction causing immediate rash, facial/tongue/throat swelling, SOB or lightheadedness with hypotension: No Has patient had a PCN reaction causing severe rash involving mucus membranes or skin necrosis: No Has patient had a PCN reaction that required hospitalization YES Has  patient had a PCN reaction occurring within the last 10 years: No If all of the above  answers are "NO", then may proceed with Cephalosporin use. "PASSES OUT"      Medication List    TAKE these medications   ferrous sulfate 325 (65 FE) MG EC tablet Take 1 tablet (325 mg total) by mouth 2 (two) times daily with a meal. What changed:  when to take this   NIFEdipine 30 MG 24 hr tablet Commonly known as:  ADALAT CC Take 1 tablet by mouth daily.   ProAir HFA 108 (90 Base) MCG/ACT inhaler Generic drug:  albuterol Inhale 2 puffs into the lungs every 6 (six) hours as needed. For shortness of breath   senna 8.6 MG Tabs tablet Commonly known as:  SENOKOT Take 1 tablet by mouth daily.   traMADol 50 MG tablet Commonly known as:  ULTRAM Take 25-50 mg by mouth 3 (three) times daily as needed for moderate pain.   VICKS VAPORUB EX Apply 1 application topically daily as needed (pain).       Allergies  Allergen Reactions   Aspirin     Advised by MD not take due to bleed   Penicillins Other (See Comments)    Has patient had a PCN reaction causing immediate rash, facial/tongue/throat swelling, SOB or lightheadedness with hypotension: No Has patient had a PCN reaction causing severe rash involving mucus membranes or skin necrosis: No Has patient had a PCN reaction that required hospitalization YES Has patient had a PCN reaction occurring within the last 10 years: No If all of the above answers are "NO", then may proceed with Cephalosporin use. "PASSES OUT"    Consultations:  GI--Rehman   Procedures/Studies: Nm Gi Blood Loss  Result Date: 02/24/2019 CLINICAL DATA:  GI bleed EXAM: NUCLEAR MEDICINE GASTROINTESTINAL BLEEDING SCAN TECHNIQUE: Sequential abdominal images were obtained following intravenous administration of Tc-103m labeled red blood cells. RADIOPHARMACEUTICALS:  25 mCi Tc-67m pertechnetate in-vitro labeled red cells. COMPARISON:  None FINDINGS: Nonstandard imaging due to  patient condition and ability to cooperate. Majority of exam performed in varying degrees of LPO positioning. Images obtained intermittently for 1 hour. Normal blood pool distribution of tracer. No definite abnormal gastrointestinal localization of tracer seen. IMPRESSION: No definite evidence of active GI bleeding. Electronically Signed   By: Ulyses Southward M.D.   On: 02/24/2019 12:52         Discharge Exam: Vitals:   02/27/19 2105 02/28/19 0536  BP: (!) 148/62 (!) 160/64  Pulse: 68 68  Resp: 18 18  Temp: 98.3 F (36.8 C) 98.7 F (37.1 C)  SpO2: 100% 100%   Vitals:   02/27/19 0609 02/27/19 1406 02/27/19 2105 02/28/19 0536  BP: (!) 152/75 (!) 118/48 (!) 148/62 (!) 160/64  Pulse: 71 69 68 68  Resp:  Temp:  98.4 F (36.9 C) 98.3 F (36.8 C) 98.7 F (37.1 C)  TempSrc:   Axillary Oral  SpO2:  100% 100% 100%  Weight:      Height:        General: Pt is alert, awake, not in acute distress Cardiovascular: RRR, S1/S2 +, no rubs, no gallops Respiratory: fine bibasilar crackles, no wheeze Abdominal: Soft, NT, ND, bowel sounds + Extremities: no edema, no cyanosis   The results of significant diagnostics from this hospitalization (including imaging, microbiology, ancillary and laboratory) are listed below for reference.    Significant Diagnostic Studies: Nm Gi Blood Loss  Result Date: 02/24/2019 CLINICAL DATA:  GI bleed EXAM: NUCLEAR MEDICINE GASTROINTESTINAL BLEEDING SCAN TECHNIQUE: Sequential abdominal images were obtained following intravenous administration of  Tc-79m labeled red blood cells. RADIOPHARMACEUTICALS:  25 mCi Tc-65m pertechnetate in-vitro labeled red cells. COMPARISON:  None FINDINGS: Nonstandard imaging due to patient condition and ability to cooperate. Majority of exam performed in varying degrees of LPO positioning. Images obtained intermittently for 1 hour. Normal blood pool distribution of tracer. No definite abnormal gastrointestinal localization of  tracer seen. IMPRESSION: No definite evidence of active GI bleeding. Electronically Signed   By: Ulyses Southward M.D.   On: 02/24/2019 12:52     Microbiology: No results found for this or any previous visit (from the past 240 hour(s)).   Labs: Basic Metabolic Panel: Recent Labs  Lab 02/23/19 2359 02/24/19 0222 02/24/19 0519 02/25/19 0403 02/26/19 0514  NA 139 139 140 143 140  K 2.7* 3.3* 3.3* 4.5 3.7  CL 103 106 109 112* 107  CO2 26 24 24 24 24   GLUCOSE 136* 119* 138* 88 115*  BUN 14 18 21 21 13   CREATININE 0.87 0.88 0.88 0.90 0.87  CALCIUM 8.7* 7.9* 7.8* 8.0* 8.7*  MG  --  1.9 2.1 2.1  --    Liver Function Tests: Recent Labs  Lab 02/23/19 2359  AST 22  ALT 12  ALKPHOS 40  BILITOT 0.7  PROT 7.3  ALBUMIN 3.9   No results for input(s): LIPASE, AMYLASE in the last 168 hours. No results for input(s): AMMONIA in the last 168 hours. CBC: Recent Labs  Lab 02/23/19 2359  02/25/19 1204 02/25/19 2037 02/26/19 0514 02/27/19 0555 02/28/19 0454  WBC 5.0  --   --   --  5.6 5.3 4.7  HGB 11.2*   < > 7.1* 8.3* 7.8* 7.2* 7.3*  HCT 34.5*   < > 23.4* 26.5* 25.0* 22.5* 23.7*  MCV 92.2  --   --   --  93.3 92.2 93.7  PLT 155  --   --   --  115* 116* 134*   < > = values in this interval not displayed.   Cardiac Enzymes: Recent Labs  Lab 02/24/19 0947 02/24/19 1553  TROPONINI <0.03 <0.03   BNP: Invalid input(s): POCBNP CBG: No results for input(s): GLUCAP in the last 168 hours.  Time coordinating discharge:  36 minutes  Signed:  Catarina Hartshorn, DO [facility] Pager: [phone] 02/28/2019, 10:25 AM

## 2019-02-28 NOTE — Discharge Instructions (Signed)
Gastrointestinal Bleeding ° °Gastrointestinal bleeding is bleeding somewhere along the path food travels through the body (digestive tract). This path is anywhere between the mouth and the opening of the butt (anus). You may have blood in your poop (stools) or have black poop. If you throw up (vomit), there may be blood in it. °This condition can be mild, serious, or even life-threatening. If you have a lot of bleeding, you may need to stay in the hospital. °Follow these instructions at home: °· Take over-the-counter and prescription medicines only as told by your doctor. °· Eat foods that have a lot of fiber in them. These foods include whole grains, fruits, and vegetables. You can also try eating 1-3 prunes each day. °· Drink enough fluid to keep your pee (urine) clear or pale yellow. °· Keep all follow-up visits as told by your doctor. This is important. °Contact a doctor if: °· Your symptoms do not get better. °Get help right away if: °· Your bleeding gets worse. °· You feel dizzy or you pass out (faint). °· You feel weak. °· You have very bad cramps in your back or belly (abdomen). °· You pass large clumps of blood (clots) in your poop. °· Your symptoms are getting worse. °This information is not intended to replace advice given to you by your health care provider. Make sure you discuss any questions you have with your health care provider. °Document Released: 08/25/2008 Document Revised: 04/23/2016 Document Reviewed: 05/06/2015 °Elsevier Interactive Patient Education © 2019 [employer] ° °

## 2019-05-02 ENCOUNTER — Emergency Department (HOSPITAL_COMMUNITY)
Admission: EM | Admit: 2019-05-02 | Discharge: 2019-05-02 | Disposition: A | Discharge status: Home / Self Care | Payer: Medicare Other | Attending: Emergency Medicine | Admitting: Emergency Medicine

## 2019-05-02 ENCOUNTER — Other Ambulatory Visit: Payer: Self-pay

## 2019-05-02 ENCOUNTER — Emergency Department (HOSPITAL_COMMUNITY): Payer: Medicare Other

## 2019-05-02 ENCOUNTER — Encounter (HOSPITAL_COMMUNITY): Payer: Self-pay

## 2019-05-02 DIAGNOSIS — N3000 Acute cystitis without hematuria: Secondary | ICD-10-CM | POA: Diagnosis not present

## 2019-05-02 DIAGNOSIS — R7989 Other specified abnormal findings of blood chemistry: Secondary | ICD-10-CM | POA: Insufficient documentation

## 2019-05-02 DIAGNOSIS — E876 Hypokalemia: Secondary | ICD-10-CM | POA: Diagnosis not present

## 2019-05-02 DIAGNOSIS — R51 Headache: Secondary | ICD-10-CM | POA: Insufficient documentation

## 2019-05-02 DIAGNOSIS — R519 Headache, unspecified: Secondary | ICD-10-CM

## 2019-05-02 DIAGNOSIS — R748 Abnormal levels of other serum enzymes: Secondary | ICD-10-CM

## 2019-05-02 DIAGNOSIS — Z79899 Other long term (current) drug therapy: Secondary | ICD-10-CM | POA: Insufficient documentation

## 2019-05-02 DIAGNOSIS — I10 Essential (primary) hypertension: Secondary | ICD-10-CM | POA: Diagnosis not present

## 2019-05-02 LAB — URINALYSIS, ROUTINE W REFLEX MICROSCOPIC
Bilirubin Urine: NEGATIVE
Glucose, UA: NEGATIVE mg/dL
Ketones, ur: NEGATIVE mg/dL
Nitrite: NEGATIVE
Protein, ur: 100 mg/dL — AB
Specific Gravity, Urine: 1.021 (ref 1.005–1.030)
pH: 5 (ref 5.0–8.0)

## 2019-05-02 LAB — BASIC METABOLIC PANEL
Anion gap: 12 (ref 5–15)
BUN: 23 mg/dL (ref 8–23)
CO2: 27 mmol/L (ref 22–32)
Calcium: 9.5 mg/dL (ref 8.9–10.3)
Chloride: 100 mmol/L (ref 98–111)
Creatinine, Ser: 1.17 mg/dL — ABNORMAL HIGH (ref 0.44–1.00)
GFR calc Af Amer: 44 mL/min — ABNORMAL LOW (ref >60)
GFR calc non Af Amer: 38 mL/min — ABNORMAL LOW (ref >60)
Glucose, Bld: 169 mg/dL — ABNORMAL HIGH (ref 70–99)
Potassium: 3.2 mmol/L — ABNORMAL LOW (ref 3.5–5.1)
Sodium: 139 mmol/L (ref 135–145)

## 2019-05-02 LAB — CBC
HCT: 42.7 % (ref 36.0–46.0)
Hemoglobin: 13.7 g/dL (ref 12.0–15.0)
MCH: 29 pg (ref 26.0–34.0)
MCHC: 32.1 g/dL (ref 30.0–36.0)
MCV: 90.3 fL (ref 80.0–100.0)
Platelets: 172 10*3/uL (ref 150–400)
RBC: 4.73 MIL/uL (ref 3.87–5.11)
RDW: 13.1 % (ref 11.5–15.5)
WBC: 6.9 10*3/uL (ref 4.0–10.5)
nRBC: 0 % (ref 0.0–0.2)

## 2019-05-02 LAB — CBG MONITORING, ED: Glucose-Capillary: 155 mg/dL — ABNORMAL HIGH (ref 70–99)

## 2019-05-02 MED ORDER — SODIUM CHLORIDE 0.9% FLUSH
3.0000 mL | Freq: Once | INTRAVENOUS | Status: AC
  Administered 2019-05-02: 3 mL via INTRAVENOUS

## 2019-05-02 MED ORDER — CEPHALEXIN 500 MG PO CAPS: 500.0000 mg | ORAL_CAPSULE | Freq: Two times a day (BID) | ORAL | 0 refills | Status: AC

## 2019-05-02 MED ORDER — POTASSIUM CHLORIDE CRYS ER 20 MEQ PO TBCR
40.0000 meq | EXTENDED_RELEASE_TABLET | Freq: Once | ORAL | Status: AC
  Administered 2019-05-02: 40 meq via ORAL
  Filled 2019-05-02: qty 2

## 2019-05-02 MED ORDER — ACETAMINOPHEN 325 MG PO TABS
650.0000 mg | ORAL_TABLET | Freq: Once | ORAL | Status: AC
  Administered 2019-05-02: 650 mg via ORAL
  Filled 2019-05-02: qty 2

## 2019-05-02 NOTE — Discharge Instructions (Addendum)
Today your head CT did not show evidence of a stroke or bleeding.  The CT scan of your neck did show some degenerative changes of your spine which could be contributing to your headache.  You were also found to have low potassium and a slight elevation in your kidney function which will need to follow-up with your doctor about.  Your urinalysis suggest that you may have a mild urinary tract infection.  A culture was sent of your urine.  If you need to change antibiotics based on the results of the urine culture the hospital will contact you.  You may take Tylenol for your headaches.  You also may use warm compresses for your headaches.  Please make appointment to follow-up with your regular doctor in 3 to 5 days for reevaluation.  Return to the ER for new or worsening symptoms in the meantime.

## 2019-05-02 NOTE — ED Triage Notes (Addendum)
Pt presents to ED with complaints of headache and generalized weakness since Friday. Pt's daughter states she seems off balance since Friday. LKW Thursday 5/28 at 22:00

## 2019-05-02 NOTE — ED Provider Notes (Signed)
  Face-to-face evaluation   History: Patient here for evaluation of headache present for couple days.  She is also been having some swelling in her feet, which started after she was given tramadol for headache.  She has been eating well.  She is able to ambulate at home with help, by her daughter who is with her.  She lives with her daughter.  Physical exam: Alert elderly female.  She is comfortable.  She is unable to give history.  Head is nontender to palpation.  Neck exhibits full range of motion.  She has normal grip strength in her hands bilaterally.   Medical screening examination/treatment/procedure(s) were conducted as a shared visit with non-physician practitioner(s) and myself.  I personally evaluated the patient during the encounter    Mancel Bale, MD 05/02/19 1745

## 2019-05-02 NOTE — ED Provider Notes (Signed)
c [facility] EMERGENCY DEPARTMENT Provider Note   CSN: [id number] Arrival date & time: 05/02/19  1221    History   Chief Complaint Chief Complaint  Patient presents with   Headache    HPI Lynn Nichols is a 85 y.o. female.     HPI   Patient is 85 year old female with history of arthritis, back pain, diverticulosis, headache, hypertension, lower GI bleeding, CVA, who presents to the emergency department today for evaluation of a headache. HA located to the frontal aspect of the head. Pain has been intermittent.  She currently states her pain has resolved.  She has chronic visual deficits on the right eye from prior CVA. States left eye has normal vision. Pt denies unilateral numbness/weakness.   Daughter present at bedside and assists with hx. She states pt has intermittent speech difficulty since prior stroke but speech has not changed since onset of headache.  She has had generalized weakness for the last several days. She has not noticed any new confusion.  Pt reports a mild productive cough for several months that is unchanged today.  No recent known falls. Has intermittent confusion at baseline.  No abd pain, NVD, chest pain, sob, or fevers.  Not on blood thinners.   Pt reports h/o afib/flutter.   Past Medical History:  Diagnosis Date   Arthritis    Back pain    Diverticulosis    Headache    Hypertension    Lower GI bleeding    Stroke (HCC) 04/2018   difficulty with vision in left eye from stroke    Patient Active Problem List   Diagnosis Date Noted   GIB (gastrointestinal bleeding) 02/25/2019   GI bleed 02/24/2019   Acute GI bleeding    Lower GI bleeding 11/17/2017   Rectal bleed 11/17/2017   Elevated troponin 11/17/2017   Hypokalemia 11/17/2017   Palliative care encounter    Goals of care, counseling/discussion    Encounter for hospice care discussion    Lower GI bleed 04/22/2017   Hypertension 04/22/2017   Diverticulosis  04/22/2017   Acute blood loss anemia 07/05/2014   Acute lower gastrointestinal bleeding 06/30/2014   Essential hypertension 06/30/2014   Acute lower GI bleeding 06/30/2014    Past Surgical History:  Procedure Laterality Date   ABDOMINAL HYSTERECTOMY     CHOLECYSTECTOMY     COLONOSCOPY N/A 07/01/2014   Procedure: COLONOSCOPY;  Surgeon: Malissa Hippo, MD;  Location: [facility];  Service: Endoscopy;  Laterality: N/A;   COLONOSCOPY N/A 07/05/2014   Procedure: COLONOSCOPY;  Surgeon: Malissa Hippo, MD;  Location: [facility];  Service: Endoscopy;  Laterality: N/A;     OB History    Gravida  14   Para  13   Term  13   Preterm      AB  1   Living  6     SAB  1   TAB      Ectopic      Multiple      Live Births               Home Medications    Prior to Admission medications   Medication Sig Start Date End Date Taking? Authorizing Provider  albuterol (PROAIR HFA) 108 (90 BASE) MCG/ACT inhaler Inhale 2 puffs into the lungs every 6 (six) hours as needed. For shortness of breath    [provider]  Camphor-Eucalyptus-Menthol (VICKS VAPORUB EX) Apply 1 application topically daily as needed (pain).  [provider]  cephALEXin (KEFLEX) 500 MG capsule Take 1 capsule (500 mg total) by mouth 2 (two) times daily for 7 days. 05/02/19 05/09/19  Isabell Bonafede S, PA-C  ferrous sulfate 325 (65 FE) MG EC tablet Take 1 tablet (325 mg total) by mouth 2 (two) times daily with a meal. Patient taking differently: Take 325 mg by mouth daily.  04/24/17   Shon Hale, MD  NIFEdipine (PROCARDIA-XL/ADALAT CC) 30 MG 24 hr tablet Take 1 tablet by mouth daily. 08/28/17   [provider]  pregabalin (LYRICA) 75 MG capsule Take 75 mg by mouth 2 (two) times daily.  04/08/19   [provider]  senna (SENOKOT) 8.6 MG TABS tablet Take 1 tablet by mouth daily.    [provider]  traMADol (ULTRAM) 50 MG tablet Take 25-50 mg by mouth 3 (three)  times daily as needed for moderate pain.     [provider]  triamcinolone (KENALOG) 0.025 % ointment Apply 1 application topically 2 (two) times daily.  12/07/18   [provider]    Family History Family History  Problem Relation Age of Onset   Hypertension Mother    Hypertension Father    Diabetes Other     Social History Social History   Tobacco Use   Smoking status: Never Smoker   Smokeless tobacco: Current User    Types: Snuff  Substance Use Topics   Alcohol use: No    Alcohol/week: 0.0 standard drinks   Drug use: No     Allergies   Aspirin and Penicillins   Review of Systems Review of Systems  Constitutional: Negative for chills and fever.  HENT: Negative for ear pain and sore throat.   Eyes: Positive for visual disturbance (chronic changes in right eye, normal vision in left eye). Negative for pain.  Respiratory: Negative for cough and shortness of breath.   Cardiovascular: Negative for chest pain.  Gastrointestinal: Negative for abdominal pain, constipation, diarrhea, nausea and vomiting.  Genitourinary: Negative for dysuria.  Musculoskeletal: Negative for back pain.  Skin: Negative for rash.  Neurological: Positive for headaches. Negative for dizziness, speech difficulty, weakness, light-headedness and numbness.  All other systems reviewed and are negative.    Physical Exam Updated Vital Signs BP (!) 115/56 (BP Location: Right Arm)    Pulse 96    Temp 97.7 F (36.5 C) (Oral)    Resp 17    SpO2 93%   Physical Exam Vitals signs and nursing note reviewed.  Constitutional:      General: She is not in acute distress.    Appearance: She is well-developed.     Comments: Appears younger than stated age  HENT:     Head: Normocephalic and atraumatic.  Eyes:     Extraocular Movements: Extraocular movements intact.     Right eye: No nystagmus.     Left eye: No nystagmus.     Conjunctiva/sclera: Conjunctivae normal.     Comments:  Right pupil fixed and right conjunctiva is injected (both chronic). Left pupil round and reactive.   Neck:     Musculoskeletal: Neck supple.  Cardiovascular:     Rate and Rhythm: Regular rhythm.     Heart sounds: No murmur.     Comments: Irregularly irregular rhythm on monitor Pulmonary:     Effort: Pulmonary effort is normal. No respiratory distress.     Breath sounds: Normal breath sounds.  Abdominal:     General: Bowel sounds are normal.     Palpations:  Abdomen is soft.     Tenderness: There is no abdominal tenderness.  Skin:    General: Skin is warm and dry.  Neurological:     Mental Status: She is alert.     Comments: Mental Status:  Alert, oriented to self and place (baseline). Speech fluent without evidence of aphasia. Able to follow 2 step commands without difficulty.  Cranial Nerves:  II:  See eye exam III,IV, VI: ptosis not present, extra-ocular motions intact bilaterally  V,VII: smile symmetric, facial light touch sensation equal VIII: hearing grossly normal to voice  X: uvula elevates symmetrically  XI: bilateral shoulder shrug symmetric and strong XII: midline tongue extension without fassiculations Motor:  Normal tone. 5/5 strength of BUE and BLE major muscle groups including strong and equal grip strength and dorsiflexion/plantar flexion Sensory: light touch normal in all extremities. Gait: not assessed CV: 2+ radial and DP pulses Negative pronator drift      ED Treatments / Results  Labs (all labs ordered are listed, but only abnormal results are displayed) Labs Reviewed  BASIC METABOLIC PANEL - Abnormal; Notable for the following components:      Result Value   Potassium 3.2 (*)    Glucose, Bld 169 (*)    Creatinine, Ser 1.17 (*)    GFR calc non Af Amer 38 (*)    GFR calc Af Amer 44 (*)    All other components within normal limits  URINALYSIS, ROUTINE W REFLEX MICROSCOPIC - Abnormal; Notable for the following components:   Hgb urine dipstick SMALL  (*)    Protein, ur 100 (*)    Leukocytes,Ua LARGE (*)    Bacteria, UA RARE (*)    All other components within normal limits  CBG MONITORING, ED - Abnormal; Notable for the following components:   Glucose-Capillary 155 (*)    All other components within normal limits  URINE CULTURE  CBC    EKG EKG Interpretation  Date/Time:  Tuesday May 02 2019 12:46:14 EDT Ventricular Rate:  99 PR Interval:    QRS Duration: 96 QT Interval:  331 QTC Calculation: 410 R Axis:   -60 Text Interpretation:  Atrial flutter Left anterior fascicular block Abnormal R-wave progression, early transition LVH with secondary repolarization abnormality since last tracing no significant change Confirmed by Mancel BaleWentz, [place] [phone]([id number]) on 05/02/2019 1:23:20 PM   Radiology Ct Head Wo Contrast  Result Date: 05/02/2019 CLINICAL DATA:  Headaches with generalized weakness for 4 days. Loss of balance. EXAM: CT HEAD WITHOUT CONTRAST CT CERVICAL SPINE WITHOUT CONTRAST TECHNIQUE: Multidetector CT imaging of the head and cervical spine was performed following the standard protocol without intravenous contrast. Multiplanar CT image reconstructions of the cervical spine were also generated. COMPARISON:  Prior studies 04/21/2018 and 11/01/2018. FINDINGS: CT HEAD FINDINGS Brain: There is no evidence of acute intracranial hemorrhage, mass lesion, brain edema or extra-axial fluid collection. There is stable mild atrophy with prominence of the ventricles and subarachnoid spaces. Old infarct along the left sylvian fissure and chronic small vessel ischemic changes in the periventricular white matter are stable. There is no CT evidence of acute cortical infarction. Vascular: Intracranial vascular calcifications. No hyperdense vessel identified. Skull: Negative for fracture or focal lesion. Sinuses/Orbits: There is mild mucosal thickening in the ethmoid sinuses. There are no air-fluid levels. The remainder of the visualized paranasal sinuses, mastoid  air cells and middle ears are clear. There are postsurgical changes in the globes. No acute orbital findings. Other: None. CT CERVICAL SPINE FINDINGS Alignment: Images through the  upper cervical spine were repeated due to motion. Stable mild reversal of the usual cervical lordosis. No focal angulation or listhesis. Skull base and vertebrae: No evidence of acute cervical spine fracture or traumatic subluxation. Soft tissues and spinal canal: No prevertebral fluid or swelling. No visible canal hematoma. Disc levels: Generally stable multilevel spondylosis with disc space narrowing, uncinate spurring and facet hypertrophy. Chronic foraminal narrowing at multiple levels is grossly stable. Upper chest: Stable 3 cm cystic lesion in the lower right neck near the thoracic inlet. Other: Bilateral carotid atherosclerosis. IMPRESSION: 1. Stable head CT without acute intracranial findings. Stable atrophy, chronic small vessel ischemic changes and old left MCA stroke. 2. No evidence of acute cervical spine fracture, traumatic subluxation or static signs of instability. 3. Multilevel cervical spondylosis. Electronically Signed   By: Carey Bullocks M.D.   On: 05/02/2019 15:18   Ct Cervical Spine Wo Contrast  Result Date: 05/02/2019 CLINICAL DATA:  Headaches with generalized weakness for 4 days. Loss of balance. EXAM: CT HEAD WITHOUT CONTRAST CT CERVICAL SPINE WITHOUT CONTRAST TECHNIQUE: Multidetector CT imaging of the head and cervical spine was performed following the standard protocol without intravenous contrast. Multiplanar CT image reconstructions of the cervical spine were also generated. COMPARISON:  Prior studies 04/21/2018 and 11/01/2018. FINDINGS: CT HEAD FINDINGS Brain: There is no evidence of acute intracranial hemorrhage, mass lesion, brain edema or extra-axial fluid collection. There is stable mild atrophy with prominence of the ventricles and subarachnoid spaces. Old infarct along the left sylvian fissure and  chronic small vessel ischemic changes in the periventricular white matter are stable. There is no CT evidence of acute cortical infarction. Vascular: Intracranial vascular calcifications. No hyperdense vessel identified. Skull: Negative for fracture or focal lesion. Sinuses/Orbits: There is mild mucosal thickening in the ethmoid sinuses. There are no air-fluid levels. The remainder of the visualized paranasal sinuses, mastoid air cells and middle ears are clear. There are postsurgical changes in the globes. No acute orbital findings. Other: None. CT CERVICAL SPINE FINDINGS Alignment: Images through the upper cervical spine were repeated due to motion. Stable mild reversal of the usual cervical lordosis. No focal angulation or listhesis. Skull base and vertebrae: No evidence of acute cervical spine fracture or traumatic subluxation. Soft tissues and spinal canal: No prevertebral fluid or swelling. No visible canal hematoma. Disc levels: Generally stable multilevel spondylosis with disc space narrowing, uncinate spurring and facet hypertrophy. Chronic foraminal narrowing at multiple levels is grossly stable. Upper chest: Stable 3 cm cystic lesion in the lower right neck near the thoracic inlet. Other: Bilateral carotid atherosclerosis. IMPRESSION: 1. Stable head CT without acute intracranial findings. Stable atrophy, chronic small vessel ischemic changes and old left MCA stroke. 2. No evidence of acute cervical spine fracture, traumatic subluxation or static signs of instability. 3. Multilevel cervical spondylosis. Electronically Signed   By: Carey Bullocks M.D.   On: 05/02/2019 15:18    Procedures Procedures (including critical care time)  Medications Ordered in ED Medications  sodium chloride flush (NS) 0.9 % injection 3 mL (3 mLs Intravenous Given 05/02/19 1300)  acetaminophen (TYLENOL) tablet 650 mg (650 mg Oral Given 05/02/19 1356)  potassium chloride SA (K-DUR) CR tablet 40 mEq (40 mEq Oral Given 05/02/19  1538)     Initial Impression / Assessment and Plan / ED Course  I have reviewed the triage vital signs and the nursing notes.  Pertinent labs & imaging results that were available during my care of the patient were reviewed by me and considered  in my medical decision making (see chart for details).     Final Clinical Impressions(s) / ED Diagnoses   Final diagnoses:  Nonintractable headache, unspecified chronicity pattern, unspecified headache type  Hypokalemia  Elevated creatine kinase  Acute cystitis without hematuria   85 year old female presenting to the emergency department today complaining of frontal headache that has been intermittent for the last 3 to 4 days.  Currently her symptoms have resolved.  Her daughter is at bedside and assist with history.  States patient has been at her mental baseline recently.  She has had generalized weakness over the last few days.  No recent falls or trauma.  No blood thinner use.  Patient afebrile with normal vital signs.  Neuro exam is nonfocal.  Has clear speech.  Strength is symmetric bilaterally.  Negative pronator drift.  CBC shows no leukocytosis or anemia. BMP shows mild hypokalemia, mild elevation in creatinine.  K-Dur given in ED.  Advised to follow-up with PCP in regards to these abnormalities. UA is mildly suggestive of UTI with hematuria, proteinuria, large leukocytes, 0-5 RBCs, 6-10 WBCs and rare bacteria.  Urine culture was sent.  Patient started on Keflex which she has tolerated in the past.  EKG Atrial flutter Left anterior fascicular block Abnormal R-wave progression, early transition LVH with secondary repolarization abnormality since last tracing no significant change  -- Chads2vasc is at least 7, would avoid anticoagulation in this patient.  CT head is stable from prior.  Atrophy and chronic small vessel ischemic disease with old left MCA stroke.  No acute changes.  CT cervical spine with no acute cervical spine fracture  or signs of instability.  Multilevel cervical spondylosis is noted.  Patient's work-up today has been reassuring.  On reevaluation she states that she continues to have no headache after Tylenol.  I suspect that her cervical spondylosis may be contributing to her headaches.  Advised to continue Tylenol and use warm compresses at home.  Discussed lab abnormalities and need for follow-up.  Discussed specific return precautions with patient and daughter at bedside who voiced understanding of the plan and reasons to return.  All questions answered.  Patient stable for discharge.  Patient was seen in conjunction with Dr. Effie Shy who personally evaluated the patient and is in agreement with the work-up and plan for discharge.   ED Discharge Orders         Ordered    cephALEXin (KEFLEX) 500 MG capsule  2 times daily     05/02/19 [address] [place], PA-C 05/02/19 1745    Mancel Bale, MD 05/02/19 1745

## 2019-05-03 LAB — URINE CULTURE

## 2019-08-12 ENCOUNTER — Emergency Department (HOSPITAL_COMMUNITY)
Admission: EM | Admit: 2019-08-12 | Discharge: 2019-08-12 | Disposition: A | Discharge status: Home / Self Care | Payer: Medicare Other | Attending: Emergency Medicine | Admitting: Emergency Medicine

## 2019-08-12 ENCOUNTER — Other Ambulatory Visit: Payer: Self-pay

## 2019-08-12 ENCOUNTER — Encounter (HOSPITAL_COMMUNITY): Payer: Self-pay | Admitting: Emergency Medicine

## 2019-08-12 DIAGNOSIS — Z79899 Other long term (current) drug therapy: Secondary | ICD-10-CM | POA: Insufficient documentation

## 2019-08-12 DIAGNOSIS — Z8673 Personal history of transient ischemic attack (TIA), and cerebral infarction without residual deficits: Secondary | ICD-10-CM | POA: Diagnosis not present

## 2019-08-12 DIAGNOSIS — Z9114 Patient's other noncompliance with medication regimen: Secondary | ICD-10-CM

## 2019-08-12 DIAGNOSIS — F17220 Nicotine dependence, chewing tobacco, uncomplicated: Secondary | ICD-10-CM | POA: Diagnosis not present

## 2019-08-12 DIAGNOSIS — Z91148 Patient's other noncompliance with medication regimen for other reason: Secondary | ICD-10-CM

## 2019-08-12 DIAGNOSIS — N3090 Cystitis, unspecified without hematuria: Secondary | ICD-10-CM | POA: Diagnosis not present

## 2019-08-12 DIAGNOSIS — I10 Essential (primary) hypertension: Secondary | ICD-10-CM | POA: Diagnosis not present

## 2019-08-12 DIAGNOSIS — R350 Frequency of micturition: Secondary | ICD-10-CM | POA: Diagnosis present

## 2019-08-12 LAB — CBC WITH DIFFERENTIAL/PLATELET
Abs Immature Granulocytes: 0.01 10*3/uL (ref 0.00–0.07)
Basophils Absolute: 0 10*3/uL (ref 0.0–0.1)
Basophils Relative: 0 %
Eosinophils Absolute: 0.2 10*3/uL (ref 0.0–0.5)
Eosinophils Relative: 4 %
HCT: 42.3 % (ref 36.0–46.0)
Hemoglobin: 13.2 g/dL (ref 12.0–15.0)
Immature Granulocytes: 0 %
Lymphocytes Relative: 26 %
Lymphs Abs: 1.3 10*3/uL (ref 0.7–4.0)
MCH: 28.7 pg (ref 26.0–34.0)
MCHC: 31.2 g/dL (ref 30.0–36.0)
MCV: 92 fL (ref 80.0–100.0)
Monocytes Absolute: 0.4 10*3/uL (ref 0.1–1.0)
Monocytes Relative: 8 %
Neutro Abs: 3 10*3/uL (ref 1.7–7.7)
Neutrophils Relative %: 62 %
Platelets: 181 10*3/uL (ref 150–400)
RBC: 4.6 MIL/uL (ref 3.87–5.11)
RDW: 16.1 % — ABNORMAL HIGH (ref 11.5–15.5)
WBC: 4.9 10*3/uL (ref 4.0–10.5)
nRBC: 0 % (ref 0.0–0.2)

## 2019-08-12 LAB — URINALYSIS, ROUTINE W REFLEX MICROSCOPIC
Bacteria, UA: NONE SEEN
Bilirubin Urine: NEGATIVE
Glucose, UA: NEGATIVE mg/dL
Hgb urine dipstick: NEGATIVE
Ketones, ur: NEGATIVE mg/dL
Nitrite: NEGATIVE
Protein, ur: 30 mg/dL — AB
Specific Gravity, Urine: 1.004 — ABNORMAL LOW (ref 1.005–1.030)
pH: 8 (ref 5.0–8.0)

## 2019-08-12 LAB — BASIC METABOLIC PANEL
Anion gap: 12 (ref 5–15)
BUN: 10 mg/dL (ref 8–23)
CO2: 26 mmol/L (ref 22–32)
Calcium: 8.9 mg/dL (ref 8.9–10.3)
Chloride: 102 mmol/L (ref 98–111)
Creatinine, Ser: 0.76 mg/dL (ref 0.44–1.00)
GFR calc Af Amer: >60 mL/min (ref >60)
GFR calc non Af Amer: >60 mL/min (ref >60)
Glucose, Bld: 129 mg/dL — ABNORMAL HIGH (ref 70–99)
Potassium: 3 mmol/L — ABNORMAL LOW (ref 3.5–5.1)
Sodium: 140 mmol/L (ref 135–145)

## 2019-08-12 LAB — LACTIC ACID, PLASMA: Lactic Acid, Venous: 0.9 mmol/L (ref 0.5–1.9)

## 2019-08-12 MED ORDER — POTASSIUM CHLORIDE CRYS ER 20 MEQ PO TBCR
40.0000 meq | EXTENDED_RELEASE_TABLET | Freq: Once | ORAL | Status: AC
  Administered 2019-08-12: 40 meq via ORAL
  Filled 2019-08-12: qty 2

## 2019-08-12 MED ORDER — NIFEDIPINE ER OSMOTIC RELEASE 30 MG PO TB24
30.0000 mg | ORAL_TABLET | Freq: Once | ORAL | Status: AC
  Administered 2019-08-12: 14:00:00 30 mg via ORAL
  Filled 2019-08-12: qty 1

## 2019-08-12 MED ORDER — ACETAMINOPHEN 325 MG PO TABS
650.0000 mg | ORAL_TABLET | Freq: Once | ORAL | Status: AC
  Administered 2019-08-12: 10:00:00 650 mg via ORAL
  Filled 2019-08-12: qty 2

## 2019-08-12 MED ORDER — SULFAMETHOXAZOLE-TRIMETHOPRIM 800-160 MG PO TABS: 1.0000 | ORAL_TABLET | Freq: Two times a day (BID) | ORAL | 0 refills | Status: AC

## 2019-08-12 NOTE — ED Triage Notes (Signed)
Pt c/o urinary frequency and generalized weakness that began last night. Denies fever, abdominal pain or n/v. Pt also c/o headache.

## 2019-08-12 NOTE — ED Provider Notes (Signed)
[facility] Provider Note   CSN: [id number] Arrival date & time: 08/12/19  0847     History   Chief Complaint Chief Complaint  Patient presents with  . Urinary Frequency    HPI Lynn Nichols is a [age] y.o. female.     HPI  Pt was seen at 0925. Per pt and her family, c/o gradual onset and persistence of constant urinary frequency and urgency since yesterday evening. Has been associated with generalized weakness/fatigue. Pt's family states pt did not take her meds today. Denies fevers, no back pain, no falls, no focal motor weakness, no rash, no CP/SOB, no abd pain, no N/V/D.    Past Medical History:  Diagnosis Date  . Arthritis   . Back pain   . Diverticulosis   . Headache   . Hypertension   . Lower GI bleeding   . Stroke [facility]) 04/2018   difficulty with vision in left eye from stroke    Patient Active Problem List   Diagnosis Date Noted  . GIB (gastrointestinal bleeding) 02/25/2019  . GI bleed 02/24/2019  . Acute GI bleeding   . Lower GI bleeding 11/17/2017  . Rectal bleed 11/17/2017  . Elevated troponin 11/17/2017  . Hypokalemia 11/17/2017  . Palliative care encounter   . Goals of care, counseling/discussion   . Encounter for hospice care discussion   . Lower GI bleed 04/22/2017  . Hypertension 04/22/2017  . Diverticulosis 04/22/2017  . Acute blood loss anemia 07/05/2014  . Acute lower gastrointestinal bleeding 06/30/2014  . Essential hypertension 06/30/2014  . Acute lower GI bleeding 06/30/2014    Past Surgical History:  Procedure Laterality Date  . ABDOMINAL HYSTERECTOMY    . CHOLECYSTECTOMY    . COLONOSCOPY N/A 07/01/2014   Procedure: COLONOSCOPY;  Surgeon: Malissa HippoNajeeb U Rehman, MD;  [facility]: [facility];  Service: Endoscopy;  Laterality: N/A;  . COLONOSCOPY N/A 07/05/2014   Procedure: COLONOSCOPY;  Surgeon: Malissa HippoNajeeb U Rehman, MD;  Location: [facility] END[facility];  Service: Endoscopy;  Laterality: N/A;     OB History    Gravida  14   Para  13   Term  13   Preterm      AB  1   Living  6     SAB  1   TAB      Ectopic      Multiple      Live Births               Home Medications    Prior to Admission medications   Medication Sig Start Date End Date Taking? Authorizing Provider  albuterol (PROAIR HFA) 108 (90 BASE) MCG/ACT inhaler Inhale 2 puffs into the lungs every 6 (six) hours as needed. For shortness of breath    [provider]  Camphor-Eucalyptus-Menthol (VICKS VAPORUB EX) Apply 1 application topically daily as needed (pain).    [provider]  ferrous sulfate 325 (65 FE) MG EC tablet Take 1 tablet (325 mg total) by mouth 2 (two) times daily with a meal. Patient taking differently: Take 325 mg by mouth daily.  04/24/17   Shon HaleEmokpae, Courage, MD  LORazepam (ATIVAN) 0.5 MG tablet Take 1 tablet by mouth 4 (four) times daily as needed. 07/31/19   [provider]  NIFEdipine (PROCARDIA-XL/ADALAT CC) 30 MG 24 hr tablet Take 1 tablet by mouth daily. 08/28/17   [provider]  pregabalin (LYRICA) 75 MG capsule Take 75 mg by mouth 2 (two) times daily.  04/08/19  [provider]  senna (SENOKOT) 8.6 MG TABS tablet Take 1 tablet by mouth daily.    [provider]  traMADol (ULTRAM) 50 MG tablet Take 25-50 mg by mouth 3 (three) times daily as needed for moderate pain.     [provider]  triamcinolone (KENALOG) 0.025 % ointment Apply 1 application topically 2 (two) times daily.  12/07/18   [provider]    Family History Family History  Problem Relation Age of Onset  . Hypertension Mother   . Hypertension Father   . Diabetes Other     Social History Social History   Tobacco Use  . Smoking status: Never Smoker  . Smokeless tobacco: Current User    Types: Snuff  Substance Use Topics  . Alcohol use: No    Alcohol/week: 0.0 standard drinks  . Drug use: No     Allergies   Aspirin and Penicillins   Review of Systems Review of  Systems ROS: Statement: All systems negative except as marked or noted in the HPI; Constitutional: Negative for fever and chills. +generalized weakness. ; ; Eyes: Negative for eye pain, redness and discharge. ; ; ENMT: Negative for ear pain, hoarseness, nasal congestion, sinus pressure and sore throat. ; ; Cardiovascular: Negative for chest pain, palpitations, diaphoresis, dyspnea and peripheral edema. ; ; Respiratory: Negative for cough, wheezing and stridor. ; ; Gastrointestinal: Negative for nausea, vomiting, diarrhea, abdominal pain, blood in stool, hematemesis, jaundice and rectal bleeding. . ; ; Genitourinary: +urinary frequency and urgency. Negative for flank pain and hematuria. ; ; Musculoskeletal: Negative for back pain and neck pain. Negative for swelling and trauma.; ; Skin: Negative for pruritus, rash, abrasions, blisters, bruising and skin lesion.; ; Neuro: Negative for headache, lightheadedness and neck stiffness. Negative for altered level of consciousness, altered mental status, extremity weakness, paresthesias, involuntary movement, seizure and syncope.       Physical Exam Updated Vital Signs BP (!) 206/95 Comment: notified Kamani Lewter, DO  Pulse 80   Temp 97.8 F (36.6 C) (Oral)   Resp 16   Ht 5\' 2"  (1.575 m)   Wt 59 kg   SpO2 99%   BMI 23.79 kg/m    11:07 Orthostatic Vital Signs TH  Orthostatic Lying   BP- Lying: 177/76  Pulse- Lying: 63      Orthostatic Sitting  BP- Sitting: 172/106Abnormal   Pulse- Sitting: 74      Orthostatic Standing at 0 minutes  BP- Standing at 0 minutes: 173/94Abnormal   Pulse- Standing at 0 minutes: 83     Patient Vitals for the past 24 hrs:  BP Temp Temp src Pulse Resp SpO2 Height Weight  08/12/19 1430 (!) 194/89 - - 79 16 99 % - -  08/12/19 1335 (!) 206/95 - - 80 - 99 % - -  08/12/19 1300 (!) 176/106 - - 69 - 99 % - -  08/12/19 1200 (!) 173/83 - - 63 - 99 % - -  08/12/19 0930 (!) 212/106 - - 91 - 99 % - -  08/12/19 0908 (!) 213/96  97.8 F (36.6 C) Oral 84 16 98 % - -  08/12/19 0906 - - - - - - 5\' 2"  (1.575 m) 59 kg     Physical Exam 0930: Physical examination:  Nursing notes reviewed; Vital signs and O2 SAT reviewed;  Constitutional: Well developed, Well nourished, Well hydrated, In no acute distress; Head:  Normocephalic, atraumatic; Eyes: EOMI, PERRL, No scleral icterus; ENMT: Mouth and pharynx normal, Mucous membranes  moist; Neck: Supple, Full range of motion, No lymphadenopathy; Cardiovascular: Regular rate and rhythm, No gallop; Respiratory: Breath sounds clear & equal bilaterally, No wheezes.  Speaking full sentences with ease, Normal respiratory effort/excursion; Chest: Nontender, Movement normal; Abdomen: Soft, Nontender, Nondistended, Normal bowel sounds; Genitourinary: No CVA tenderness; Extremities: Peripheral pulses normal, No tenderness, No edema, No calf edema or asymmetry.; Neuro: Awake, alert, mildly confused regarding events/time which is pt's baseline per family at bedside. No facial droop.  Speech clear. No gross focal motor deficits in extremities.; Skin: Color normal, Warm, Dry.   ED Treatments / Results  Labs (all labs ordered are listed, but only abnormal results are displayed)   EKG None  Radiology   Procedures Procedures (including critical care time)  Medications Ordered in ED Medications  acetaminophen (TYLENOL) tablet 650 mg (650 mg Oral Given 08/12/19 0949)  potassium chloride SA (K-DUR) CR tablet 40 mEq (40 mEq Oral Given 08/12/19 1249)  NIFEdipine (PROCARDIA-XL/NIFEDICAL-XL) 24 hr tablet 30 mg (30 mg Oral Given 08/12/19 1347)     Initial Impression / Assessment and Plan / ED Course  I have reviewed the triage vital signs and the nursing notes.  Pertinent labs & imaging results that were available during my care of the patient were reviewed by me and considered in my medical decision making (see chart for details).     MDM Reviewed: previous chart, nursing note and vitals  Reviewed previous: labs Interpretation: labs   Results for orders placed or performed during the hospital encounter of 08/12/19  Urinalysis, Routine w reflex microscopic  Result Value Ref Range   Color, Urine STRAW (A) YELLOW   APPearance CLEAR CLEAR   Specific Gravity, Urine 1.004 (L) 1.005 - 1.030   pH 8.0 5.0 - 8.0   Glucose, UA NEGATIVE NEGATIVE mg/dL   Hgb urine dipstick NEGATIVE NEGATIVE   Bilirubin Urine NEGATIVE NEGATIVE   Ketones, ur NEGATIVE NEGATIVE mg/dL   Protein, ur 30 (A) NEGATIVE mg/dL   Nitrite NEGATIVE NEGATIVE   Leukocytes,Ua TRACE (A) NEGATIVE   RBC / HPF 0-5 0 - 5 RBC/hpf   WBC, UA 0-5 0 - 5 WBC/hpf   Bacteria, UA NONE SEEN NONE SEEN   Squamous Epithelial / LPF 0-5 0 - 5  Basic metabolic panel  Result Value Ref Range   Sodium 140 135 - 145 mmol/L   Potassium 3.0 (L) 3.5 - 5.1 mmol/L   Chloride 102 98 - 111 mmol/L   CO2 26 22 - 32 mmol/L   Glucose, Bld 129 (H) 70 - 99 mg/dL   BUN 10 8 - 23 mg/dL   Creatinine, Ser 1.610.76 0.44 - 1.00 mg/dL   Calcium 8.9 8.9 - 09.610.3 mg/dL   GFR calc non Af Amer >60 >60 mL/min   GFR calc Af Amer >60 >60 mL/min   Anion gap 12 5 - 15  Lactic acid, plasma  Result Value Ref Range   Lactic Acid, Venous 0.9 0.5 - 1.9 mmol/L  CBC with Differential  Result Value Ref Range   WBC 4.9 4.0 - 10.5 K/uL   RBC 4.60 3.87 - 5.11 MIL/uL   Hemoglobin 13.2 12.0 - 15.0 g/dL   HCT 04.542.3 40.936.0 - 81.146.0 %   MCV 92.0 80.0 - 100.0 fL   MCH 28.7 26.0 - 34.0 pg   MCHC 31.2 30.0 - 36.0 g/dL   RDW 91.416.1 (H) 78.211.5 - 95.615.5 %   Platelets 181 150 - 400 K/uL   nRBC 0.0 0.0 - 0.2 %  Neutrophils Relative % 62 %   Neutro Abs 3.0 1.7 - 7.7 K/uL   Lymphocytes Relative 26 %   Lymphs Abs 1.3 0.7 - 4.0 K/uL   Monocytes Relative 8 %   Monocytes Absolute 0.4 0.1 - 1.0 K/uL   Eosinophils Relative 4 %   Eosinophils Absolute 0.2 0.0 - 0.5 K/uL   Basophils Relative 0 %   Basophils Absolute 0.0 0.0 - 0.1 K/uL   Immature Granulocytes 0 %   Abs Immature Granulocytes  0.01 0.00 - 0.07 K/uL    Lynn Nichols was evaluated in Emergency Department on 08/12/2019 for the symptoms described in the history of present illness. She was evaluated in the context of the global COVID-19 pandemic, which necessitated consideration that the patient might be at risk for infection with the SARS-CoV-2 virus that causes COVID-19. Institutional protocols and algorithms that pertain to the evaluation of patients at risk for COVID-19 are in a state of rapid change based on information released by regulatory bodies including the CDC and federal and state organizations. These policies and algorithms were followed during the patient's care in the ED.    1445:  After multiple phone calls by family at bedside, it was determined that pt did NOT take her HTN meds this morning; so dose given in ED. Pt visualized to bend her arm at the elbow when BP cuff starts to inflate. When I stood at the bedside to re-check her BP and straighten her arm (though pt did try to bend it while I held it straight): BP was 173/83, 194/89. Will tx for cystitis while UC pending. Dx and testing, as well as incidental finding(s), d/w pt and family.  Questions answered.  Verb understanding, agreeable to d/c home with outpt f/u.       Final Clinical Impressions(s) / ED Diagnoses   Final diagnoses:  Cystitis  Chronic hypertension  Non compliance w medication regimen    ED Discharge Orders    None       Samuel Jester, DO 08/15/19 1052

## 2019-08-12 NOTE — ED Notes (Signed)
McMan[place] , DO notified of BP 194/89

## 2019-08-12 NOTE — Discharge Instructions (Signed)
Take the prescription as directed.  Call your regular medical doctor on Monday to schedule a follow up appointment within the next 3 days.  Return to the Emergency Department immediately sooner if worsening.  ° °

## 2019-08-14 LAB — URINE CULTURE: Culture: <10000 — AB

## 2019-09-25 ENCOUNTER — Encounter (HOSPITAL_COMMUNITY): Payer: Self-pay

## 2019-09-25 ENCOUNTER — Other Ambulatory Visit: Payer: Self-pay

## 2019-09-25 ENCOUNTER — Emergency Department (HOSPITAL_COMMUNITY): Payer: Medicare Other

## 2019-09-25 ENCOUNTER — Emergency Department (HOSPITAL_COMMUNITY)
Admission: EM | Admit: 2019-09-25 | Discharge: 2019-09-25 | Disposition: A | Discharge status: Home / Self Care | Payer: Medicare Other | Attending: Emergency Medicine | Admitting: Emergency Medicine

## 2019-09-25 DIAGNOSIS — I10 Essential (primary) hypertension: Secondary | ICD-10-CM | POA: Insufficient documentation

## 2019-09-25 DIAGNOSIS — G8929 Other chronic pain: Secondary | ICD-10-CM | POA: Insufficient documentation

## 2019-09-25 DIAGNOSIS — M25511 Pain in right shoulder: Secondary | ICD-10-CM | POA: Diagnosis present

## 2019-09-25 DIAGNOSIS — F17200 Nicotine dependence, unspecified, uncomplicated: Secondary | ICD-10-CM | POA: Diagnosis not present

## 2019-09-25 DIAGNOSIS — Z8673 Personal history of transient ischemic attack (TIA), and cerebral infarction without residual deficits: Secondary | ICD-10-CM | POA: Diagnosis not present
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
## 2019-09-25 NOTE — Discharge Instructions (Signed)
You were seen in the emergency department today with shoulder pain.  Your x-ray shows arthritis which you can take Tylenol for pain.  Follow with your primary care doctor if symptoms worsen.

## 2019-09-29 ENCOUNTER — Inpatient Hospital Stay (HOSPITAL_COMMUNITY)
Admission: EM | Admit: 2019-09-29 | Discharge: 2019-10-01 | DRG: 389 | Disposition: E | Payer: Medicare Other | Attending: Internal Medicine | Admitting: Internal Medicine

## 2019-09-29 ENCOUNTER — Emergency Department (HOSPITAL_COMMUNITY): Payer: Medicare Other

## 2019-09-29 ENCOUNTER — Encounter (HOSPITAL_COMMUNITY): Payer: Self-pay

## 2019-09-29 ENCOUNTER — Other Ambulatory Visit: Payer: Self-pay

## 2019-09-29 DIAGNOSIS — E860 Dehydration: Secondary | ICD-10-CM | POA: Diagnosis present

## 2019-09-29 DIAGNOSIS — H539 Unspecified visual disturbance: Secondary | ICD-10-CM | POA: Diagnosis present

## 2019-09-29 DIAGNOSIS — Z20828 Contact with and (suspected) exposure to other viral communicable diseases: Secondary | ICD-10-CM | POA: Diagnosis present

## 2019-09-29 DIAGNOSIS — Z79891 Long term (current) use of opiate analgesic: Secondary | ICD-10-CM

## 2019-09-29 DIAGNOSIS — Z90710 Acquired absence of both cervix and uterus: Secondary | ICD-10-CM

## 2019-09-29 DIAGNOSIS — Z7189 Other specified counseling: Secondary | ICD-10-CM

## 2019-09-29 DIAGNOSIS — I10 Essential (primary) hypertension: Secondary | ICD-10-CM | POA: Diagnosis present

## 2019-09-29 DIAGNOSIS — Z880 Allergy status to penicillin: Secondary | ICD-10-CM

## 2019-09-29 DIAGNOSIS — F419 Anxiety disorder, unspecified: Secondary | ICD-10-CM | POA: Diagnosis present

## 2019-09-29 DIAGNOSIS — Z515 Encounter for palliative care: Secondary | ICD-10-CM | POA: Insufficient documentation

## 2019-09-29 DIAGNOSIS — F0390 Unspecified dementia without behavioral disturbance: Secondary | ICD-10-CM | POA: Diagnosis present

## 2019-09-29 DIAGNOSIS — K562 Volvulus: Principal | ICD-10-CM

## 2019-09-29 DIAGNOSIS — R188 Other ascites: Secondary | ICD-10-CM | POA: Diagnosis present

## 2019-09-29 DIAGNOSIS — Z833 Family history of diabetes mellitus: Secondary | ICD-10-CM

## 2019-09-29 DIAGNOSIS — R109 Unspecified abdominal pain: Secondary | ICD-10-CM | POA: Diagnosis not present

## 2019-09-29 DIAGNOSIS — Z8249 Family history of ischemic heart disease and other diseases of the circulatory system: Secondary | ICD-10-CM

## 2019-09-29 DIAGNOSIS — N179 Acute kidney failure, unspecified: Secondary | ICD-10-CM | POA: Diagnosis present

## 2019-09-29 DIAGNOSIS — I69398 Other sequelae of cerebral infarction: Secondary | ICD-10-CM

## 2019-09-29 DIAGNOSIS — N281 Cyst of kidney, acquired: Secondary | ICD-10-CM | POA: Diagnosis present

## 2019-09-29 DIAGNOSIS — Z79899 Other long term (current) drug therapy: Secondary | ICD-10-CM

## 2019-09-29 DIAGNOSIS — Z886 Allergy status to analgesic agent status: Secondary | ICD-10-CM

## 2019-09-29 DIAGNOSIS — F17220 Nicotine dependence, chewing tobacco, uncomplicated: Secondary | ICD-10-CM | POA: Diagnosis present

## 2019-09-29 DIAGNOSIS — Z66 Do not resuscitate: Secondary | ICD-10-CM | POA: Diagnosis present

## 2019-09-29 DIAGNOSIS — Z9049 Acquired absence of other specified parts of digestive tract: Secondary | ICD-10-CM

## 2019-09-29 DIAGNOSIS — R630 Anorexia: Secondary | ICD-10-CM | POA: Diagnosis present

## 2019-09-29 LAB — URINALYSIS, ROUTINE W REFLEX MICROSCOPIC
Bilirubin Urine: NEGATIVE
Glucose, UA: NEGATIVE mg/dL
Ketones, ur: NEGATIVE mg/dL
Leukocytes,Ua: NEGATIVE
Nitrite: NEGATIVE
Protein, ur: 100 mg/dL — AB
Specific Gravity, Urine: 1.018 (ref 1.005–1.030)
pH: 5 (ref 5.0–8.0)

## 2019-09-29 LAB — SARS CORONAVIRUS 2 (TAT 6-24 HRS): SARS Coronavirus 2: NEGATIVE

## 2019-09-29 LAB — COMPREHENSIVE METABOLIC PANEL
ALT: 12 U/L (ref 0–44)
AST: 24 U/L (ref 15–41)
Albumin: 3.8 g/dL (ref 3.5–5.0)
Alkaline Phosphatase: 44 U/L (ref 38–126)
Anion gap: 13 (ref 5–15)
BUN: 24 mg/dL — ABNORMAL HIGH (ref 8–23)
CO2: 22 mmol/L (ref 22–32)
Calcium: 9 mg/dL (ref 8.9–10.3)
Chloride: 100 mmol/L (ref 98–111)
Creatinine, Ser: 1.23 mg/dL — ABNORMAL HIGH (ref 0.44–1.00)
GFR calc Af Amer: 41 mL/min — ABNORMAL LOW (ref >60)
GFR calc non Af Amer: 35 mL/min — ABNORMAL LOW (ref >60)
Glucose, Bld: 188 mg/dL — ABNORMAL HIGH (ref 70–99)
Potassium: 3.6 mmol/L (ref 3.5–5.1)
Sodium: 135 mmol/L (ref 135–145)
Total Bilirubin: 0.9 mg/dL (ref 0.3–1.2)
Total Protein: 7.6 g/dL (ref 6.5–8.1)

## 2019-09-29 LAB — CBC WITH DIFFERENTIAL/PLATELET
Abs Immature Granulocytes: 0.03 10*3/uL (ref 0.00–0.07)
Basophils Absolute: 0 10*3/uL (ref 0.0–0.1)
Basophils Relative: 0 %
Eosinophils Absolute: 0 10*3/uL (ref 0.0–0.5)
Eosinophils Relative: 0 %
HCT: 47 % — ABNORMAL HIGH (ref 36.0–46.0)
Hemoglobin: 14.9 g/dL (ref 12.0–15.0)
Immature Granulocytes: 0 %
Lymphocytes Relative: 4 %
Lymphs Abs: 0.5 10*3/uL — ABNORMAL LOW (ref 0.7–4.0)
MCH: 29.6 pg (ref 26.0–34.0)
MCHC: 31.7 g/dL (ref 30.0–36.0)
MCV: 93.3 fL (ref 80.0–100.0)
Monocytes Absolute: 0.5 10*3/uL (ref 0.1–1.0)
Monocytes Relative: 5 %
Neutro Abs: 10.1 10*3/uL — ABNORMAL HIGH (ref 1.7–7.7)
Neutrophils Relative %: 91 %
Platelets: 156 10*3/uL (ref 150–400)
RBC: 5.04 MIL/uL (ref 3.87–5.11)
RDW: 14 % (ref 11.5–15.5)
WBC: 11.1 10*3/uL — ABNORMAL HIGH (ref 4.0–10.5)
nRBC: 0 % (ref 0.0–0.2)

## 2019-09-29 LAB — LIPASE, BLOOD: Lipase: 19 U/L (ref 11–51)

## 2019-09-29 LAB — LACTIC ACID, PLASMA
Lactic Acid, Venous: 4.2 mmol/L (ref 0.5–1.9)
Lactic Acid, Venous: 3.4 mmol/L (ref 0.5–1.9)

## 2019-09-29 LAB — POC OCCULT BLOOD, ED: Fecal Occult Bld: NEGATIVE

## 2019-09-29 MED ORDER — MORPHINE SULFATE (PF) 2 MG/ML IV SOLN
1.0000 mg | INTRAVENOUS | Status: DC | PRN
  Administered 2019-09-30 (×2): 2 mg via INTRAVENOUS
  Filled 2019-09-29 (×3): qty 1

## 2019-09-29 MED ORDER — MORPHINE SULFATE (PF) 2 MG/ML IV SOLN
2.0000 mg | Freq: Once | INTRAVENOUS | Status: AC
  Administered 2019-09-29: 13:00:00 2 mg via INTRAVENOUS
  Filled 2019-09-29: qty 1

## 2019-09-29 MED ORDER — SODIUM CHLORIDE 0.9 % IV BOLUS
1000.0000 mL | Freq: Once | INTRAVENOUS | Status: AC
  Administered 2019-09-29: 13:00:00 1000 mL via INTRAVENOUS

## 2019-09-29 MED ORDER — ONDANSETRON HCL 4 MG/2ML IJ SOLN
2.0000 mg | Freq: Once | INTRAMUSCULAR | Status: AC
  Administered 2019-09-29: 2 mg via INTRAVENOUS
  Filled 2019-09-29: qty 2

## 2019-09-29 MED ORDER — IOHEXOL 300 MG/ML  SOLN
80.0000 mL | Freq: Once | INTRAMUSCULAR | Status: AC | PRN
  Administered 2019-09-29: 14:00:00 80 mL via INTRAVENOUS

## 2019-09-29 MED ORDER — LORAZEPAM 2 MG/ML IJ SOLN
1.0000 mg | INTRAMUSCULAR | Status: DC | PRN
  Filled 2019-09-29: qty 1

## 2019-09-29 NOTE — Clinical Social Work Note (Signed)
Call from Dr. Waldron Labs advising that patient would need hospice. Patient to be placed in obs for 24 hours to determine residential hospice vs. Hospice at her home.  Multiple attempts may to contact daughter in ED unsuccessfull.  TOC following.

## 2019-09-29 NOTE — ED Notes (Signed)
Pt has hx of dementia, unable to answer questions

## 2019-09-29 NOTE — H&P (Signed)
TRH H&P   Patient Demographics:    Lynn Nichols, is a 85 y.o. female  MRN: [id number]   DOB - [birth year]  Admit Date - 09/21/2019  Outpatient Primary MD for the patient is [facility]  Referring MD/NP/PA: PA Sophia   Patient coming from: Home  Chief Complaint  Patient presents with  . Abdominal Pain      HPI:    Lynn Nichols  is a 85 y.o. female, with past medical history of hypertension, CVA, dementia, who lives with her daughter at home, daughter provide most of the history, as patient is currently sleeping not answering any questions, and with baseline dementia, patient presents to ED secondary to complaints of acute onset of abdominal pain, lower abdomen, sharp quality, started over last 24 hours, daughter brought her to ED secondary to concerns of UTI, no fever, no chills, no nausea, no vomiting, no diarrhea, no melena or bright red blood per rectum. - in ED patient noted to have significant abdomen, CT abdomen pelvis significant for acute sigmoid volvulus, surrounding ascites, patient was evaluated by neurosurgery in ED, decision made by daughter to proceed with comfort care, as he did not want any procedures or surgery, and I was called to admit for pain control.    Review of systems:    Unable to obtain any review of system secondary to patient mental status  With Past History of the following :    Past Medical History:  Diagnosis Date  . Arthritis   . Back pain   . Diverticulosis   . Headache   . Hypertension   . Lower GI bleeding   . Stroke (HCC) 04/2018   difficulty with vision in left eye from stroke      Past Surgical History:  Procedure Laterality Date  . ABDOMINAL HYSTERECTOMY    . CHOLECYSTECTOMY    . COLONOSCOPY N/A 07/01/2014   Procedure: COLONOSCOPY;  Surgeon: Malissa Hippo, MD;  [facility];   Service: Endoscopy;  Laterality: N/A;  . COLONOSCOPY N/A 07/05/2014   Procedure: COLONOSCOPY;  Surgeon: Malissa Hippo, MD;  [facility];  Service: Endoscopy;  Laterality: N/A;      Social History:     Social History   Tobacco Use  . Smoking status: Never Smoker  . Smokeless tobacco: Current User    Types: Snuff  Substance Use Topics  . Alcohol use: No    Alcohol/week: 0.0 standard drinks      Family History :     Family History  Problem Relation Age of Onset  . Hypertension Mother   . Hypertension Father   . Diabetes Other       Home Medications:   Prior to Admission medications   Medication Sig Start Date End Date Taking? Authorizing Provider  albuterol (PROAIR HFA) 108 (90 BASE) MCG/ACT inhaler Inhale 2 puffs  into the lungs every 6 (six) hours as needed. For shortness of breath    [provider]  Camphor-Eucalyptus-Menthol (VICKS VAPORUB EX) Apply 1 application topically daily as needed (pain).    [provider]  ferrous sulfate 325 (65 FE) MG EC tablet Take 1 tablet (325 mg total) by mouth 2 (two) times daily with a meal. Patient taking differently: Take 325 mg by mouth daily.  04/24/17   Roxan Hockey, MD  lisinopril (ZESTRIL) 20 MG tablet Take 20 mg by mouth daily. 08/25/19   [provider]  LORazepam (ATIVAN) 0.5 MG tablet Take 1 tablet by mouth 4 (four) times daily as needed. 07/31/19   [provider]  NIFEdipine (PROCARDIA-XL/ADALAT CC) 30 MG 24 hr tablet Take 1 tablet by mouth daily. 08/28/17   [provider]  pregabalin (LYRICA) 75 MG capsule Take 75 mg by mouth 2 (two) times daily.  04/08/19   [provider]  senna (SENOKOT) 8.6 MG TABS tablet Take 1 tablet by mouth daily.    [provider]  sulfamethoxazole-trimethoprim (BACTRIM DS) 800-160 MG tablet Take 1 tablet by mouth 2 (two) times daily. 08/12/19   Francine Graven, DO  traMADol (ULTRAM) 50 MG tablet Take 25-50 mg by mouth 3  (three) times daily as needed for moderate pain.     [provider]  triamcinolone (KENALOG) 0.025 % ointment Apply 1 application topically 2 (two) times daily.  12/07/18   [provider]     Allergies:     Allergies  Allergen Reactions  . Aspirin     Advised by MD not take due to bleed  . Penicillins Other (See Comments)    Has patient had a PCN reaction causing immediate rash, facial/tongue/throat swelling, SOB or lightheadedness with hypotension: No Has patient had a PCN reaction causing severe rash involving mucus membranes or skin necrosis: No Has patient had a PCN reaction that required hospitalization YES Has patient had a PCN reaction occurring within the last 10 years: No If all of the above answers are "NO", then may proceed with Cephalosporin use. "PASSES OUT"     Physical Exam:   Vitals  Blood pressure (!) 146/87, pulse 95, temperature (!) 97.4 F (36.3 C), temperature source Rectal, resp. rate 18, height 5\' 4"  (1.626 m), weight 53.5 kg, SpO2 98 %.   1. General frail elderly female, comfortable in bed after she received morphine.  2.  Tried to awake up patient, but she does not want answer any questions or open her eyes  3.  Appears to be moving all extremity without significant deficits, eyes closed  4. Ears and Eyes appear Normal, dry oral mucosa  5. Supple Neck, No JVD, No cervical lymphadenopathy appriciated, No Carotid Bruits.  6. Symmetrical Chest wall movement, Good air movement bilaterally, CTAB.  7.  Irregular irregular no Gallops, Rubs or Murmurs, No Parasternal Heave.  8.  Rigid abdomen, with diffuse tenderness, there is guarding, but no rebound .  9.  No Cyanosis, Normal Skin Turgor, No Skin Rash or Bruise.  10. Good muscle tone,  joints appear normal , no effusions,  11. No Palpable Lymph Nodes in Neck or Axillae     Data Review:    CBC Recent Labs  Lab Oct 02, 2019 1205  WBC 11.1*  HGB 14.9  HCT 47.0*  PLT 156  MCV  93.3  MCH 29.6  MCHC 31.7  RDW 14.0  LYMPHSABS 0.5*  MONOABS 0.5  EOSABS 0.0  BASOSABS 0.0   ------------------------------------------------------------------------------------------------------------------  Chemistries  Recent Labs  Lab 03-07-2019 1205  NA 135  K 3.6  CL 100  CO2 22  GLUCOSE 188*  BUN 24*  CREATININE 1.23*  CALCIUM 9.0  AST 24  ALT 12  ALKPHOS 44  BILITOT 0.9   ------------------------------------------------------------------------------------------------------------------ estimated creatinine clearance is 19 mL/min (A) (by C-G formula based on SCr of 1.23 mg/dL (H)). ------------------------------------------------------------------------------------------------------------------ No results for input(s): TSH, T4TOTAL, T3FREE, THYROIDAB in the last 72 hours.  Invalid input(s): FREET3  Coagulation profile No results for input(s): INR, PROTIME in the last 168 hours. ------------------------------------------------------------------------------------------------------------------- No results for input(s): DDIMER in the last 72 hours. -------------------------------------------------------------------------------------------------------------------  Cardiac Enzymes No results for input(s): CKMB, TROPONINI, MYOGLOBIN in the last 168 hours.  Invalid input(s): CK ------------------------------------------------------------------------------------------------------------------ No results found for: BNP   ---------------------------------------------------------------------------------------------------------------  Urinalysis    Component Value Date/Time   COLORURINE AMBER (A) 04-07-202020 1229   APPEARANCEUR HAZY (A) 04-07-202020 1229   LABSPEC 1.018 04-07-202020 1229   PHURINE 5.0 04-07-202020 1229   GLUCOSEU NEGATIVE 04-07-202020 1229   HGBUR SMALL (A) 04-07-202020 1229   BILIRUBINUR NEGATIVE 04-07-202020 1229   KETONESUR NEGATIVE 04-07-202020 1229    PROTEINUR 100 (A) 04-07-202020 1229   UROBILINOGEN 0.2 02/17/2015 1502   NITRITE NEGATIVE 04-07-202020 1229   LEUKOCYTESUR NEGATIVE 04-07-202020 1229    ----------------------------------------------------------------------------------------------------------------   Imaging Results:   CLINICAL DATA: Abdominal pain with nausea and vomiting this morning.  EXAM: CT ABDOMEN AND PELVIS WITH CONTRAST  TECHNIQUE: Multidetector CT imaging of the abdomen and pelvis was performed using the standard protocol following bolus administration of intravenous contrast.  CONTRAST: 80mL OMNIPAQUE IOHEXOL 300 MG/ML SOLN  COMPARISON: 11/17/2017  FINDINGS: Lower chest: Enlarged heart. Minimal bibasilar atelectasis.  Hepatobiliary: Moderate intrahepatic biliary ductal dilatation, without significant change. Mildly lobulated liver contours. Surgically absent gallbladder.  Pancreas: Diffusely atrophied.  Spleen: Small.  Adrenals/Urinary Tract: Unremarkable adrenal glands. Bilateral renal cysts. Unremarkable urinary bladder and ureters.  Stomach/Bowel: Interval sigmoid volvulus with rotation of the sigmoid mesentery and vessels and edema of the mesentery. The trapped loop is dilated and contains fluid and gas. No wall thickening or gas. The stomach and small bowel are unremarkable.  Vascular/Lymphatic: Atheromatous arterial calcifications without aneurysm. No enlarged lymph nodes.  Reproductive: Uterus and bilateral adnexa are unremarkable.  Other: Small to moderate amount of free peritoneal fluid, not previously present. No free peritoneal air.  Musculoskeletal: Old, healed right lower rib fractures. Extensive lumbar and lower thoracic spine degenerative changes and mild scoliosis. The degenerative changes include facet degenerative changes throughout the lumbar spine, with associated grade 1 to 2 anterolisthesis at the L4-5 level without significant change. No pars defects or fractures  are seen.  IMPRESSION: 1. Acute sigmoid volvulus with edema of the mesentery, compatible with an element of vascular compromise, without ischemic changes involving the bowel wall. 2. Interval small to moderate amount of ascites, possibly associated with the sigmoid volvulus. Another cause could be mild changes of cirrhosis of the liver suggested by the liver contours. 3. Stable moderate intrahepatic biliary ductal dilatation, within normal limits for a patient of this age who has had a cholecystectomy.  Critical Value/emergent results were called by telephone at the time of interpretation on 04-07-202020 at 2:02 pm to providerSOPHIA CACCAVALE , who verbally acknowledged these results.    Assessment & Plan:    Active Problems:   Essential hypertension   Goals of care, counseling/discussion   Volvulus of sigmoid colon (HCC)  Acute sigmoid volvulus -Presents with abdominal pain, CT abdomen pelvis showing  sigmoid volvulus, general surgery input greatly appreciated, they had lengthy discussion with the patient/daughter, explained risks and benefits of decompressive colonoscopy, and possible need for surgery, daughter had made informed decision of no surgery after general surgery or cast multiple times with the daughter, not want any procedures to be performed, and they want to focus on comfort. -I have discussed at length with the daughter as well, our main goal will be comfort, will keep her on as needed morphine, as needed Ativan, discussed with case management who will consult hospice service to see if home hospice versus hospice facility is appropriate, this is all pending on her clinical condition over the next 24 hours.  Goals of care: -Patient currently offers measures only, please see above discussion   DVT Prophylaxis None>>comfort  AM Labs Ordered, also please review Full Orders  Family Communication: Admission, patients condition and plan of care including tests being ordered  have been discussed with the patient and  who indicate understanding and agree with the plan and Code Status.  Code Status DNR/comfort care  Likely DC to  Pending further work up.  Condition GUARDED    Consults called: none  Admission status: Observation.  Time spent in minutes : 45 minutes   Huey Bienenstock M.D on 10/03/19 at 3:30 PM  Between 7am to 7pm - Pager - [phone]. After 7pm go to [email or web address] - password [facility]  [facility] - Office  [phone]

## 2019-09-29 NOTE — Plan of Care (Signed)
  Problem: Pain Managment: Goal: General experience of comfort will improve Outcome: Progressing   

## 2019-09-29 NOTE — ED Notes (Addendum)
CRITICAL VALUE ALERT  Critical Value:  Lactic 4.2  Date & Time Notied:  09/25/2019 1551   Provider Notified: Sophia PA  Orders Received/Actions taken: None yet

## 2019-09-29 NOTE — ED Triage Notes (Signed)
Pt is complaining of abdominal pain that started yesterday. Daughter is concerned for a UTI. Dry heaving this morning, but has not vomited yet.

## 2019-09-29 NOTE — Consult Note (Signed)
[facility] Consult  Reason for Consult: Sigmoid volvulus  Referring Physician: Alveria Apley PA (ED)   Chief Complaint    Abdominal Pain      HPI: Lynn Nichols is a 85 y.o. female with acute onset of abdominal pain in the lower abdomen that is sharp in nature and associated with some nausea and dry heaving but no vomiting.  The pain started yesterday and her daughter who she lives with reports she was worried about a UTI.  The pain was not improved with any maneuver, and she has not had a BM in a fwe days.  She denied any fever or chills. She has been eating normally until yesterday.  She otherwise has a history of a recent admission for a GI bleed.  A CT was performed in the ED and there is signs of a sigmoid volvulus. Her daughter does most of the talking and describes the patient as having some memory problems.   Past Medical History:  Diagnosis Date  . Arthritis   . Back pain   . Diverticulosis   . Headache   . Hypertension   . Lower GI bleeding   . Stroke (HCC) 04/2018   difficulty with vision in left eye from stroke    Past Surgical History:  Procedure Laterality Date  . ABDOMINAL HYSTERECTOMY    . CHOLECYSTECTOMY    . COLONOSCOPY N/A 07/01/2014   Procedure: COLONOSCOPY;  Surgeon: Malissa Hippo, MD;  [facility];  Service: Endoscopy;  Laterality: N/A;  . COLONOSCOPY N/A 07/05/2014   Procedure: COLONOSCOPY;  Surgeon: Malissa Hippo, MD;  [facility];  Service: Endoscopy;  Laterality: N/A;    Family History  Problem Relation Age of Onset  . Hypertension Mother   . Hypertension Father   . Diabetes Other     Social History   Tobacco Use  . Smoking status: Never Smoker  . Smokeless tobacco: Current User    Types: Snuff  Substance Use Topics  . Alcohol use: No    Alcohol/week: 0.0 standard drinks  . Drug use: No    Medications: I have reviewed the patient's current medications. No current facility-administered  medications for this encounter.    Current Outpatient Medications  Medication Sig Dispense Refill Last Dose  . albuterol (PROAIR HFA) 108 (90 BASE) MCG/ACT inhaler Inhale 2 puffs into the lungs every 6 (six) hours as needed. For shortness of breath     . Camphor-Eucalyptus-Menthol (VICKS VAPORUB EX) Apply 1 application topically daily as needed (pain).     . ferrous sulfate 325 (65 FE) MG EC tablet Take 1 tablet (325 mg total) by mouth 2 (two) times daily with a meal. (Patient taking differently: Take 325 mg by mouth daily. ) 60 tablet 3   . lisinopril (ZESTRIL) 20 MG tablet Take 20 mg by mouth daily.     Marland Kitchen LORazepam (ATIVAN) 0.5 MG tablet Take 1 tablet by mouth 4 (four) times daily as needed.     Marland Kitchen NIFEdipine (PROCARDIA-XL/ADALAT CC) 30 MG 24 hr tablet Take 1 tablet by mouth daily.     . pregabalin (LYRICA) 75 MG capsule Take 75 mg by mouth 2 (two) times daily.      Marland Kitchen senna (SENOKOT) 8.6 MG TABS tablet Take 1 tablet by mouth daily.     Marland Kitchen sulfamethoxazole-trimethoprim (BACTRIM DS) 800-160 MG tablet Take 1 tablet by mouth 2 (two) times daily. 14 tablet 0   . traMADol (ULTRAM) 50 MG tablet Take  25-50 mg by mouth 3 (three) times daily as needed for moderate pain.      Marland Kitchen triamcinolone (KENALOG) 0.025 % ointment Apply 1 application topically 2 (two) times daily.         Allergies  Allergen Reactions  . Aspirin     Advised by MD not take due to bleed  . Penicillins Other (See Comments)    Has patient had a PCN reaction causing immediate rash, facial/tongue/throat swelling, SOB or lightheadedness with hypotension: No Has patient had a PCN reaction causing severe rash involving mucus membranes or skin necrosis: No Has patient had a PCN reaction that required hospitalization YES Has patient had a PCN reaction occurring within the last 10 years: No If all of the above answers are "NO", then may proceed with Cephalosporin use. "PASSES OUT"     ROS:  A comprehensive review of systems was  negative except for: Gastrointestinal: positive for abdominal pain, constipation and nausea Neurological: positive for memory problems  Blood pressure (!) 146/87, pulse 95, temperature (!) 97.4 F (36.3 C), temperature source Rectal, resp. rate 18, height 5\' 4"  (1.626 m), weight 53.5 kg, SpO2 98 %. Physical Exam Vitals signs reviewed.  Constitutional:      Appearance: Normal appearance.  HENT:     Head: Normocephalic.  Eyes:     Comments: Eyes closed  Cardiovascular:     Rate and Rhythm: Normal rate. Rhythm irregular.  Pulmonary:     Effort: Pulmonary effort is normal.  Abdominal:     General: There is no distension.     Palpations: Abdomen is rigid.     Tenderness: There is abdominal tenderness. There is guarding. There is no rebound.     Hernia: No hernia is present.  Skin:    General: Skin is warm and dry.  Neurological:     Mental Status: She is easily aroused. Mental status is at baseline. She is disoriented.  Psychiatric:        Mood and Affect: Mood is not anxious or depressed.     Results: Results for orders placed or performed during the hospital encounter of 09/27/2019 (from the past 48 hour(s))  CBC with Differential     Status: Abnormal   Collection Time: 09/21/2019 12:05 PM  Result Value Ref Range   WBC 11.1 (H) 4.0 - 10.5 K/uL   RBC 5.04 3.87 - 5.11 MIL/uL   Hemoglobin 14.9 12.0 - 15.0 g/dL   HCT 47.0 (H) 36.0 - 46.0 %   MCV 93.3 80.0 - 100.0 fL   MCH 29.6 26.0 - 34.0 pg   MCHC 31.7 30.0 - 36.0 g/dL   RDW 14.0 11.5 - 15.5 %   Platelets 156 150 - 400 K/uL   nRBC 0.0 0.0 - 0.2 %   Neutrophils Relative % 91 %   Neutro Abs 10.1 (H) 1.7 - 7.7 K/uL   Lymphocytes Relative 4 %   Lymphs Abs 0.5 (L) 0.7 - 4.0 K/uL   Monocytes Relative 5 %   Monocytes Absolute 0.5 0.1 - 1.0 K/uL   Eosinophils Relative 0 %   Eosinophils Absolute 0.0 0.0 - 0.5 K/uL   Basophils Relative 0 %   Basophils Absolute 0.0 0.0 - 0.1 K/uL   Immature Granulocytes 0 %   Abs Immature  Granulocytes 0.03 0.00 - 0.07 K/uL    Comment: Performed at [facility], [address] [place], [place] [address]  Comprehensive metabolic panel     Status: Abnormal   Collection Time: 09/15/2019 12:05  PM  Result Value Ref Range   Sodium 135 135 - 145 mmol/L   Potassium 3.6 3.5 - 5.1 mmol/L   Chloride 100 98 - 111 mmol/L   CO2 22 22 - 32 mmol/L   Glucose, Bld 188 (H) 70 - 99 mg/dL   BUN 24 (H) 8 - 23 mg/dL   Creatinine, Ser 1.47 (H) 0.44 - 1.00 mg/dL   Calcium 9.0 8.9 - 82.9 mg/dL   Total Protein 7.6 6.5 - 8.1 g/dL   Albumin 3.8 3.5 - 5.0 g/dL   AST 24 15 - 41 U/L   ALT 12 0 - 44 U/L   Alkaline Phosphatase 44 38 - 126 U/L   Total Bilirubin 0.9 0.3 - 1.2 mg/dL   GFR calc non Af Amer 35 (L) >60 mL/min   GFR calc Af Amer 41 (L) >60 mL/min   Anion gap 13 5 - 15    Comment: Performed at [facility], [address] [place], [place] [address]  Lipase, blood     Status: None   Collection Time: 2019-10-05 12:05 PM  Result Value Ref Range   Lipase 19 11 - 51 U/L    Comment: Performed at [facility], [address] [place], [place] [address]  POC occult blood, ED Provider will collect     Status: None   Collection Time: Oct 05, 2019 12:23 PM  Result Value Ref Range   Fecal Occult Bld NEGATIVE NEGATIVE  Urinalysis, Routine w reflex microscopic     Status: Abnormal   Collection Time: 10/05/2019 12:29 PM  Result Value Ref Range   Color, Urine AMBER (A) YELLOW    Comment: BIOCHEMICALS MAY BE AFFECTED BY COLOR   APPearance HAZY (A) CLEAR   Specific Gravity, Urine 1.018 1.005 - 1.030   pH 5.0 5.0 - 8.0   Glucose, UA NEGATIVE NEGATIVE mg/dL   Hgb urine dipstick SMALL (A) NEGATIVE   Bilirubin Urine NEGATIVE NEGATIVE   Ketones, ur NEGATIVE NEGATIVE mg/dL   Protein, ur 846 (A) NEGATIVE mg/dL   Nitrite NEGATIVE NEGATIVE   Leukocytes,Ua NEGATIVE NEGATIVE   RBC / HPF 0-5 0 - 5 RBC/hpf   WBC, UA 0-5 0 - 5 WBC/hpf   Bacteria, UA RARE (A) NONE SEEN   Squamous Epithelial / LPF 0-5 0 - 5   Mucus  PRESENT    Hyaline Casts, UA PRESENT     Comment: Performed at [facility], [address], [place], [place] [address]    CT a/p- personally reviewed- dilated sigmoid colon significant mesenteric edema and significant fluid around the upper abdomen and pelvis, swirling pattern in mesentery consistent with volvulus   Assessment & Plan:  Lynn Nichols is a 85 y.o. female with a sigmoid volvulus with peritonitis on exam as she is quite rigid and tender. She is easily awakened but is not interacting much at this time. I am concerned that the bowel is already becoming ischemic. I discussed with the daughter the option of surgery with potential untwisting and resection versus pexy and the possibility of a permanent ostomy. We discussed that a colonoscopic decompression could be attempted but with her tenderness I am worried the bowel is dying and if the decompression showed dead bowel that we would then move to surgery. We also discussed comfort measures and the unknown timeframe that the patient might pass and attempts to keep her comfortable.    The daughter does not want any surgery, and after multiple attempts at explaining colonoscopic decompression, she does not want  this procedure to be performed either. She wants her mother to be comfortable.  She has asked for comfort measures and DNR. She understands that we will not do chest compressions, intubate, or do any ACLS type medications or shocks.  She is in agreement with this plan.   -Comfort measures, DNR placed in the chart   All questions were answered to the satisfaction of the patient.   Lucretia RoersLindsay C Jamyiah Labella 09/24/2019, 3:28 PM

## 2019-09-29 NOTE — ED Provider Notes (Signed)
[facility] EMERGENCY DEPARTMENT Provider Note   CSN: [id number] Arrival date & time: 10-02-2019  1048     History   Chief Complaint Chief Complaint  Patient presents with  . 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  Patient states her abdominal pain began acutely yesterday.  She reports it is all over, but worse in the left lower quadrant.  It is constant, nothing makes it better or worse.  She reports urinary symptoms, however is unable to describe what they are, denying dysuria, hematuria, urinary frequency.  Daughter states patient has not been complaining of any urinary symptoms with her.  Patient reports nausea without vomiting.  Daughter states she was dry heaving earlier today.  Daughter denies fevers, chills.  Patient denies abnormal bowel movements, denies blood in her stool.  Daughter states patient normally eats well, had a poor appetite yesterday.  Patient denies increased pain with oral intake.   Additional history obtained from chart review.  Patient with a history of diverticulosis, hypertension, lower GI bleed (not on thinners), previous CVA.     HPI  Past Medical History:  Diagnosis Date  . Arthritis   . Back pain   . Diverticulosis   . Headache   . Hypertension   . Lower GI bleeding   . Stroke [facility]) 04/2018   difficulty with vision in left eye from stroke    Patient Active Problem List   Diagnosis Date Noted  . GIB (gastrointestinal bleeding) 02/25/2019  . GI bleed 02/24/2019  . Acute GI bleeding   . Lower GI bleeding 11/17/2017  . Rectal bleed 11/17/2017  . Elevated troponin 11/17/2017  . Hypokalemia 11/17/2017  . Palliative care encounter   . Goals of care, counseling/discussion   . Encounter for hospice care discussion   . Lower GI bleed 04/22/2017  . Hypertension 04/22/2017  . Diverticulosis 04/22/2017  . Acute blood loss anemia 07/05/2014  . Acute lower gastrointestinal  bleeding 06/30/2014  . Essential hypertension 06/30/2014  . Acute lower GI bleeding 06/30/2014    Past Surgical History:  Procedure Laterality Date  . ABDOMINAL HYSTERECTOMY    . CHOLECYSTECTOMY    . COLONOSCOPY N/A 07/01/2014   Procedure: COLONOSCOPY;  Surgeon: Malissa Hippo, MD;  Location: [facility] END[facility] SUITE;  Service: Endoscopy;  Laterality: N/A;  . COLONOSCOPY N/A 07/05/2014   Procedure: COLONOSCOPY;  Surgeon: Malissa Hippo, MD;  Location: [facility] ENDO SUITE;  Service: Endoscopy;  Laterality: N/A;     OB History    Gravida  14   Para  13   Term  13   Preterm      AB  1   Living  6     SAB  1   TAB      Ectopic      Multiple      Live Births               Home Medications    Prior to Admission medications   Medication Sig Start Date End Date Taking? Authorizing Provider  albuterol (PROAIR HFA) 108 (90 BASE) MCG/ACT inhaler Inhale 2 puffs into the lungs every 6 (six) hours as needed. For shortness of breath    [provider]  Camphor-Eucalyptus-Menthol (VICKS VAPORUB EX) Apply 1 application topically daily as needed (pain).    [provider]  ferrous sulfate 325 (65 FE) MG EC tablet Take 1 tablet (325 mg total) by mouth  2 (two) times daily with a meal. Patient taking differently: Take 325 mg by mouth daily.  04/24/17   Shon Hale, MD  lisinopril (ZESTRIL) 20 MG tablet Take 20 mg by mouth daily. 08/25/19   [provider]  LORazepam (ATIVAN) 0.5 MG tablet Take 1 tablet by mouth 4 (four) times daily as needed. 07/31/19   [provider]  NIFEdipine (PROCARDIA-XL/ADALAT CC) 30 MG 24 hr tablet Take 1 tablet by mouth daily. 08/28/17   [provider]  pregabalin (LYRICA) 75 MG capsule Take 75 mg by mouth 2 (two) times daily.  04/08/19   [provider]  senna (SENOKOT) 8.6 MG TABS tablet Take 1 tablet by mouth daily.    [provider]  sulfamethoxazole-trimethoprim (BACTRIM DS) 800-160 MG tablet Take 1  tablet by mouth 2 (two) times daily. 08/12/19   Samuel Jester, DO  traMADol (ULTRAM) 50 MG tablet Take 25-50 mg by mouth 3 (three) times daily as needed for moderate pain.     [provider]  triamcinolone (KENALOG) 0.025 % ointment Apply 1 application topically 2 (two) times daily.  12/07/18   [provider]    Family History Family History  Problem Relation Age of Onset  . Hypertension Mother   . Hypertension Father   . Diabetes Other     Social History Social History   Tobacco Use  . Smoking status: Never Smoker  . Smokeless tobacco: Current User    Types: Snuff  Substance Use Topics  . Alcohol use: No    Alcohol/week: 0.0 standard drinks  . Drug use: No     Allergies   Aspirin and Penicillins   Review of Systems Review of Systems  Gastrointestinal: Positive for abdominal pain and nausea.  All other systems reviewed and are negative.    Physical Exam Updated Vital Signs BP (!) 146/87   Pulse 95   Temp (!) 97.4 F (36.3 C) (Rectal)   Resp 18   Ht  (1.626 m)   Wt 53.5 kg   SpO2 98%   BMI 20.25 kg/m   Physical Exam Vitals signs and nursing note reviewed. Exam conducted with a chaperone present.  Constitutional:      General: She is not in acute distress.    Appearance: She is well-developed.     Comments: Appears nontoxic. mildly confused, baseline per daughter.   HENT:     Head: Normocephalic and atraumatic.  Eyes:     Extraocular Movements: Extraocular movements intact.     Conjunctiva/sclera: Conjunctivae normal.     Pupils: Pupils are equal, round, and reactive to light.  Neck:     Musculoskeletal: Normal range of motion and neck supple.  Cardiovascular:     Rate and Rhythm: Normal rate and regular rhythm.     Pulses: Normal pulses.  Pulmonary:     Effort: Pulmonary effort is normal. No respiratory distress.     Breath sounds: Normal breath sounds. No wheezing.  Abdominal:     General: There is no distension.      Palpations: Abdomen is soft. There is no mass.     Tenderness: There is abdominal tenderness. There is no rebound.     Comments: Generalized tenderness palpation the abdomen, worse in the left lower quadrant. No rigidity or distention. negative rebound  Genitourinary:    Rectum: Guaiac result negative.     Comments: No gross blood on exam. Hemoccult negative.  Musculoskeletal: Normal range of motion.  Skin:    General:  Skin is warm and dry.     Capillary Refill: Capillary refill takes less than 2 seconds.  Neurological:     Mental Status: She is alert.     Comments: Mildly confused. Baseline per daughter.       ED Treatments / Results  Labs (all labs ordered are listed, but only abnormal results are displayed) Labs Reviewed  CBC WITH DIFFERENTIAL/PLATELET - Abnormal; Notable for the following components:      Result Value   WBC 11.1 (*)    HCT 47.0 (*)    Neutro Abs 10.1 (*)    Lymphs Abs 0.5 (*)    All other components within normal limits  COMPREHENSIVE METABOLIC PANEL - Abnormal; Notable for the following components:   Glucose, Bld 188 (*)    BUN 24 (*)    Creatinine, Ser 1.23 (*)    GFR calc non Af Amer 35 (*)    GFR calc Af Amer 41 (*)    All other components within normal limits  URINALYSIS, ROUTINE W REFLEX MICROSCOPIC - Abnormal; Notable for the following components:   Color, Urine AMBER (*)    APPearance HAZY (*)    Hgb urine dipstick SMALL (*)    Protein, ur 100 (*)    Bacteria, UA RARE (*)    All other components within normal limits  URINE CULTURE  LIPASE, BLOOD  LACTIC ACID, PLASMA  LACTIC ACID, PLASMA  POC OCCULT BLOOD, ED    EKG None  Radiology No results found.  Procedures .Critical Care Performed by: Alveria Apleyaccavale, Aalliyah Kilker, PA-C Authorized by: Alveria Apleyaccavale, Dermot Gremillion, PA-C   Critical care provider statement:    Critical care time (minutes):  35   Critical care time was exclusive of:  Separately billable procedures and treating other patients and  teaching time   Critical care was necessary to treat or prevent imminent or life-threatening deterioration of the following conditions: mesenteric ischemia.   Critical care was time spent personally by me on the following activities:  Blood draw for specimens, development of treatment plan with patient or surrogate, discussions with consultants, evaluation of patient's response to treatment, examination of patient, obtaining history from patient or surrogate, ordering and performing treatments and interventions, ordering and review of laboratory studies, ordering and review of radiographic studies, pulse oximetry, re-evaluation of patient's condition and review of old charts   I assumed direction of critical care for this patient from another provider in my specialty: no   Comments:     Pt with sigmoid volvulus with concern for vascular compromise. Admitted for further management.    (including critical care time)  Medications Ordered in ED Medications  ondansetron (ZOFRAN) injection 2 mg (2 mg Intravenous Given 02-11-2019 1250)  morphine 2 MG/ML injection 2 mg (2 mg Intravenous Given 02-11-2019 1249)  sodium chloride 0.9 % bolus 1,000 mL (1,000 mLs Intravenous New Bag/Given 02-11-2019 1304)  iohexol (OMNIPAQUE) 300 MG/ML solution 80 mL (80 mLs Intravenous Contrast Given 02-11-2019 1335)     Initial Impression / Assessment and Plan / ED Course  I have reviewed the triage vital signs and the nursing notes.  Pertinent labs & imaging results that were available during my care of the patient were reviewed by me and considered in my medical decision making (see chart for details).        Patient presenting for evaluation of nausea and abdominal pain.  Physical exam shows patient who is mildly confused, baseline per daughter.  Generalized abdominal tenderness, worse in the left lower  quadrant.  Concern for diverticulitis considering her history of diverticulosis.  Also consider GI bleed due to her  recent history of the same.  Rectal exam and Hemoccult reassuring.  Labs show slight leukocytosis at 11.1.  Slight dehydration with a BUN of 24 and a creatinine of 1.23.  Urine without obvious infection, culture pending.  CT pending.  CT shows sigmoid volvulus with edema of the mesentery without obvious ischemia.  Also with some free peritoneal fluid which may be due to ascites/cirrhosis versus volvulus.  Case discussed with attending, Dr. Sabra Heck evaluated patient.  Discussed findings with pt and daughter. Will call general surgery for further management.  Discussed with Dr. Constance Haw from general surgery who will evaluate the patient.  Patient and daughter decided they do not want any surgery or decompression.  Want only comfort care.  Would like to be DNR.  As such, will admit to the hospitalist for comfort measures.  Discussed with Dr. Waldron Labs from [facility], pt to be admitted.     Final Clinical Impressions(s) / ED Diagnoses   Final diagnoses:  Sigmoid volvulus [facility])    ED Discharge Orders    None       Franchot Heidelberg, PA-C 10-04-2019 1547    Noemi Chapel, MD 09/25/2019 [phone]

## 2019-09-30 DIAGNOSIS — N179 Acute kidney failure, unspecified: Secondary | ICD-10-CM | POA: Diagnosis present

## 2019-09-30 DIAGNOSIS — R109 Unspecified abdominal pain: Secondary | ICD-10-CM | POA: Diagnosis present

## 2019-09-30 DIAGNOSIS — K562 Volvulus: Secondary | ICD-10-CM | POA: Diagnosis present

## 2019-09-30 DIAGNOSIS — I69398 Other sequelae of cerebral infarction: Secondary | ICD-10-CM | POA: Diagnosis not present

## 2019-09-30 DIAGNOSIS — Z515 Encounter for palliative care: Secondary | ICD-10-CM | POA: Diagnosis present

## 2019-09-30 DIAGNOSIS — I10 Essential (primary) hypertension: Secondary | ICD-10-CM | POA: Diagnosis present

## 2019-09-30 DIAGNOSIS — Z79891 Long term (current) use of opiate analgesic: Secondary | ICD-10-CM | POA: Diagnosis not present

## 2019-09-30 DIAGNOSIS — Z66 Do not resuscitate: Secondary | ICD-10-CM | POA: Diagnosis present

## 2019-09-30 DIAGNOSIS — Z833 Family history of diabetes mellitus: Secondary | ICD-10-CM | POA: Diagnosis not present

## 2019-09-30 DIAGNOSIS — N281 Cyst of kidney, acquired: Secondary | ICD-10-CM | POA: Diagnosis present

## 2019-09-30 DIAGNOSIS — Z20828 Contact with and (suspected) exposure to other viral communicable diseases: Secondary | ICD-10-CM | POA: Diagnosis present

## 2019-09-30 DIAGNOSIS — F419 Anxiety disorder, unspecified: Secondary | ICD-10-CM | POA: Diagnosis present

## 2019-09-30 DIAGNOSIS — Z8249 Family history of ischemic heart disease and other diseases of the circulatory system: Secondary | ICD-10-CM | POA: Diagnosis not present

## 2019-09-30 DIAGNOSIS — F17220 Nicotine dependence, chewing tobacco, uncomplicated: Secondary | ICD-10-CM | POA: Diagnosis present

## 2019-09-30 DIAGNOSIS — E860 Dehydration: Secondary | ICD-10-CM | POA: Diagnosis present

## 2019-09-30 DIAGNOSIS — R630 Anorexia: Secondary | ICD-10-CM | POA: Diagnosis present

## 2019-09-30 DIAGNOSIS — F0390 Unspecified dementia without behavioral disturbance: Secondary | ICD-10-CM | POA: Diagnosis present

## 2019-09-30 DIAGNOSIS — Z90710 Acquired absence of both cervix and uterus: Secondary | ICD-10-CM | POA: Diagnosis not present

## 2019-09-30 DIAGNOSIS — H539 Unspecified visual disturbance: Secondary | ICD-10-CM | POA: Diagnosis present

## 2019-09-30 DIAGNOSIS — Z79899 Other long term (current) drug therapy: Secondary | ICD-10-CM | POA: Diagnosis not present

## 2019-09-30 DIAGNOSIS — Z886 Allergy status to analgesic agent status: Secondary | ICD-10-CM | POA: Diagnosis not present

## 2019-09-30 DIAGNOSIS — Z880 Allergy status to penicillin: Secondary | ICD-10-CM | POA: Diagnosis not present

## 2019-09-30 DIAGNOSIS — Z9049 Acquired absence of other specified parts of digestive tract: Secondary | ICD-10-CM | POA: Diagnosis not present

## 2019-09-30 DIAGNOSIS — R188 Other ascites: Secondary | ICD-10-CM | POA: Diagnosis present

## 2019-09-30 LAB — URINE CULTURE: Culture: NO GROWTH

## 2019-09-30 MED ORDER — LORAZEPAM 1 MG PO TABS
1.0000 mg | ORAL_TABLET | ORAL | Status: DC | PRN
  Filled 2019-09-30: qty 1

## 2019-09-30 MED ORDER — MORPHINE SULFATE (CONCENTRATE) 10 MG/0.5ML PO SOLN
10.0000 mg | ORAL | Status: DC | PRN
  Administered 2019-09-30 (×2): 10 mg via SUBLINGUAL
  Filled 2019-09-30 (×2): qty 0.5

## 2019-10-01 NOTE — Progress Notes (Signed)
Lynn Nichols, Deneise Lever [place]'s daughter, stated pt was not breathing. Pt found unresponsive at 1925, no respirations, no pulse, no heart or breath sounds auscultated. No code called per signed DNR order in chart. Second Nurse verified finding. Time of death was 66. MD notified and signed death certificate. [place] donors was called at 45. [place] denied pt. External urinary catheter removed and peripheral left forearm IV was removed and dressing applied. Postmortem care performed. Body sent to morgue.

## 2019-10-01 NOTE — Progress Notes (Signed)
Subjective: Patient admitted with acute sigmoid volvulus and plan is for comfort care only. The patient has had pain intermittently and was taking intravenous morphine but the IV site not working now.  Family did not wish another IV to be inserted.   Objective: Vital signs in last 24 hours: Temp:  [97.4 F (36.3 C)-98.5 F (36.9 C)] 98.5 F (36.9 C) (10/30 1832) Pulse Rate:  [90-120] 120 (10/30 1832) Resp:  [17-26] 17 (10/30 1832) BP: (146-194)/(87-145) 150/97 (10/30 1832) SpO2:  [76 %-98 %] 95 % (10/31 0827) Weight:  [52 kg-53.5 kg] 52 kg (10/30 1800) Appears to be comfortable at rest but when I palpate her abdomen, she grimaces in pain.   Intake/Output from previous day: 10/30 0701 - 10/31 0700 In: 1000 [IV Piggyback:1000] Out: -  Intake/Output this shift: No intake/output data recorded.  Recent Labs    09/15/2019 1205  HGB 14.9   Recent Labs    09/16/2019 1205  WBC 11.1*  RBC 5.04  HCT 47.0*  PLT 156   Recent Labs    09/26/2019 1205  NA 135  K 3.6  CL 100  CO2 22  BUN 24*  CREATININE 1.23*  GLUCOSE 188*  CALCIUM 9.0   @LABLAST2 (labpt:2,inr:  Assessment/Plan: 1.  Acute sigmoid volvulus.  Palliative care only.  I will use sublingual morphine for comfort measures.  Follow clinically.   Lynn Nichols C Lynn Nichols October 19, 2019, 10:44 AM

## 2019-10-01 NOTE — Progress Notes (Signed)
[facility]  Stopped in to see patient. Resting comfortable. Family at bedside. Offered support.   Curlene Labrum, MD [facility] [address] [place], [place] [address] [phone]/ [phone] (office)

## 2019-10-01 NOTE — Progress Notes (Signed)
Xcover Pt's granddaughter thinks that family needs an explanation of what comfort care is.  She thinks that "Hassan Rowan" her daughter doesn't understand what that is.  She is the POA.

## 2019-10-01 DEATH — deceased

## 2019-10-31 NOTE — Discharge Summary (Addendum)
Admission date: 09/03/2019 Death date: 08-Oct-2019. History: This unfortunate 85 year old lady was admitted with abdominal pain.  Please see initial history and physical examination.  She was found to have an acute sigmoid volvulus as well as acute kidney injury based on creatinine levels.  Hospital progress: : Once the patient presented with acute sigmoid volvulus, surgery was consulted and after lengthy discussion with the patient and the family, risks and benefits of decompression colonoscopy and possible need for surgery were explained, the family made the decision that they did not wish any procedure or intervention. Therefore, the plan was made for the patient to be comfort care only and that the main goal of care would be to alleviate pain and suffering.  Therefore, the patient was given analgesia in the form of opioids and also benzodiazepines for anxiety. The patient was kept comfortable and passed away peacefully. Patient family was informed.

## 2020-03-31 IMAGING — CT CT HEAD WITHOUT CONTRAST
5 of 12 series · 17 of 47 positions shown, 18 images · non-contrast
Comparison: Prior studies 04/21/2018 and 11/01/2018.

CLINICAL DATA: Headaches with generalized weakness for 4 days. Loss
of balance.

EXAM:
CT HEAD WITHOUT CONTRAST
CT CERVICAL SPINE WITHOUT CONTRAST
TECHNIQUE: Multidetector CT imaging of the head and cervical spine was
performed following the standard protocol without intravenous
contrast. Multiplanar CT image reconstructions of the cervical spine
were also generated.

[Series 5: head w o · axial · 0.44mm/px · z∈[-52,+93]mm · 3 of 30 slices shown, 4 images]
[im 1/30  brain]
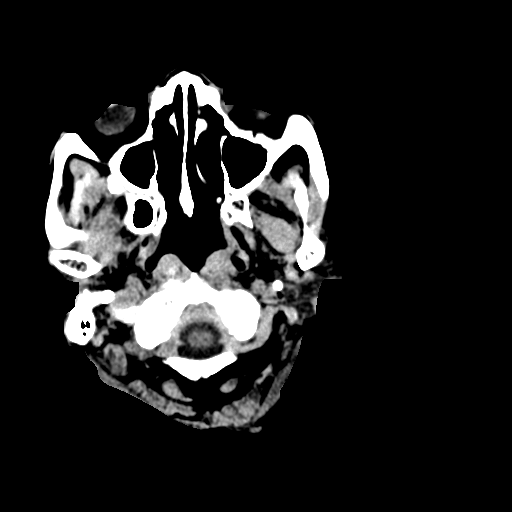
[im 1/30  bone]
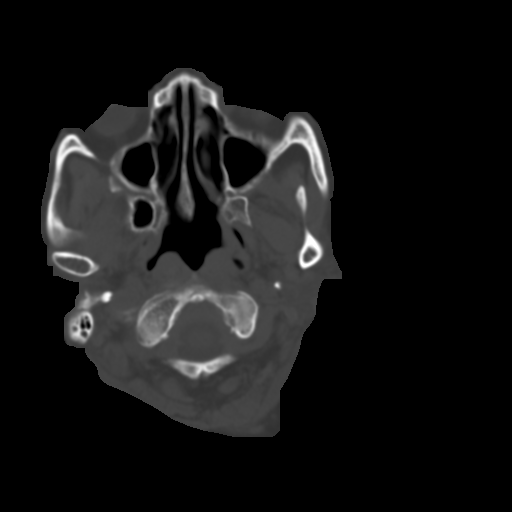
[im 15/30  brain]
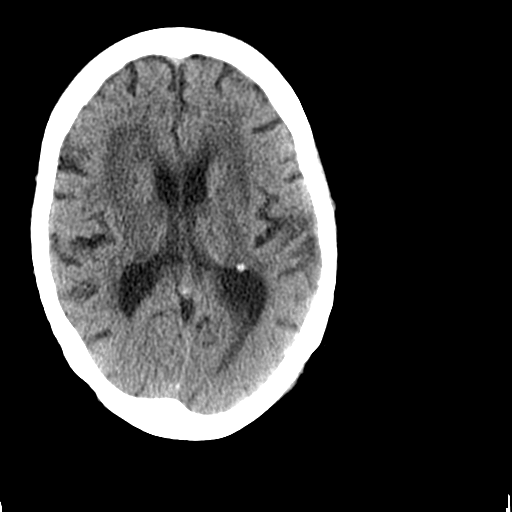
[im 30/30  brain]
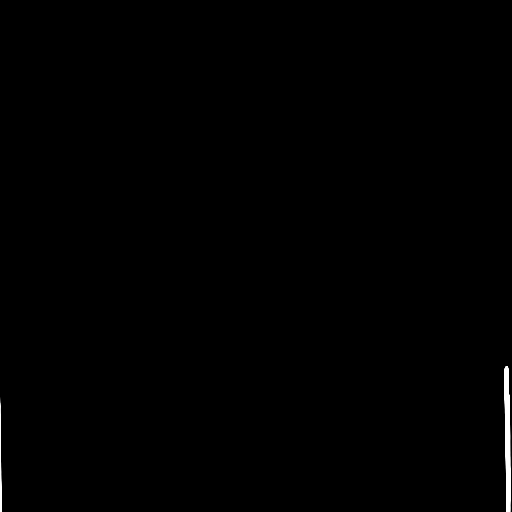

[Series 6: head bone · axial · 0.44mm/px · z∈[-28,+46]mm · 4 of 75 slices shown]
[im 13/75  bone]
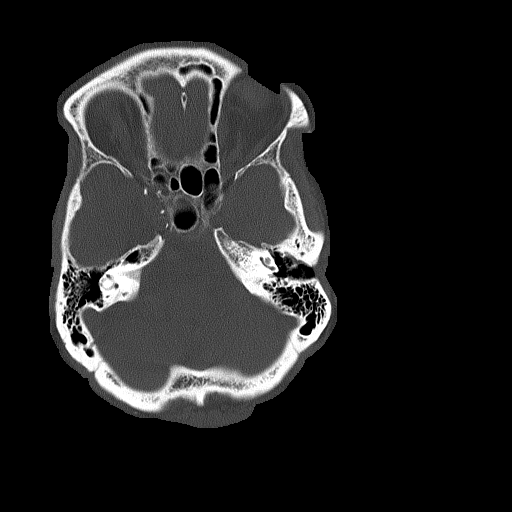
[im 25/75  bone]
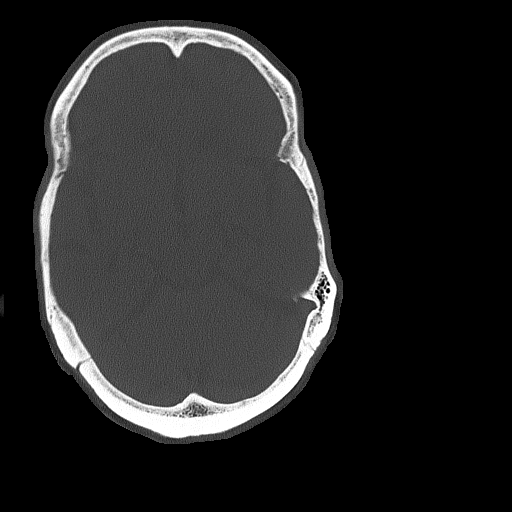
[im 38/75  bone]
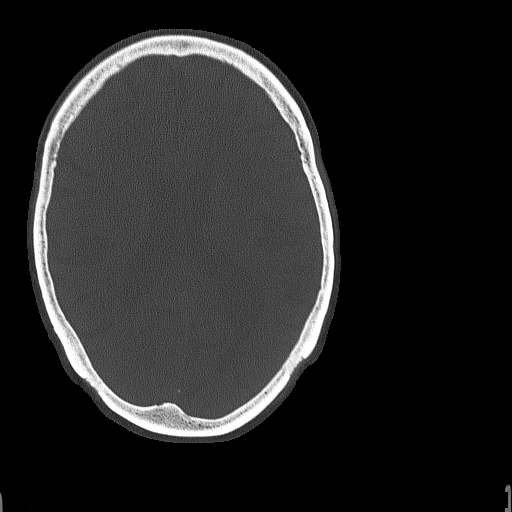
[im 50/75  bone]
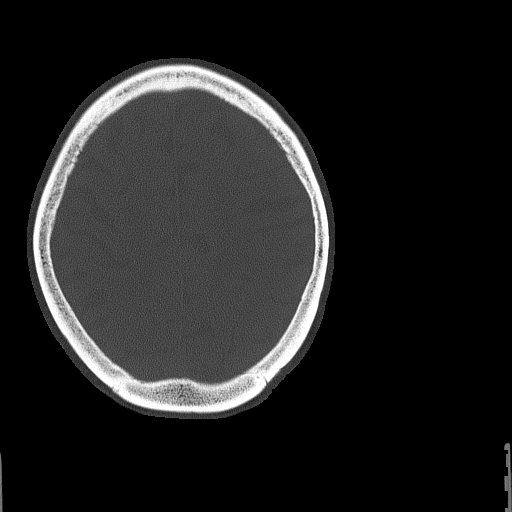

[Series 7: coronal soft · coronal · 0.31mm/px · 2 of 80 slices shown]
[im 27/80  brain]
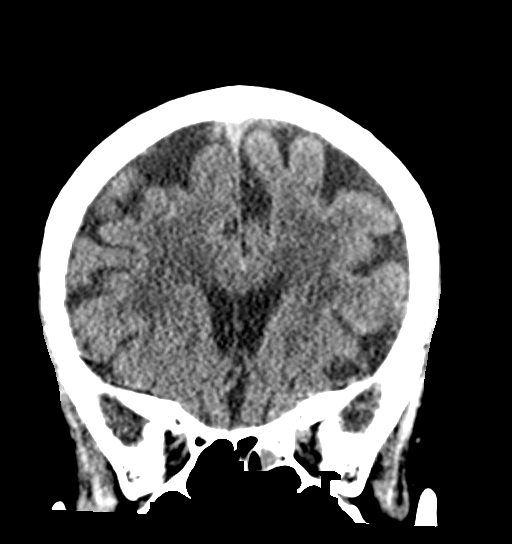
[im 53/80  brain]
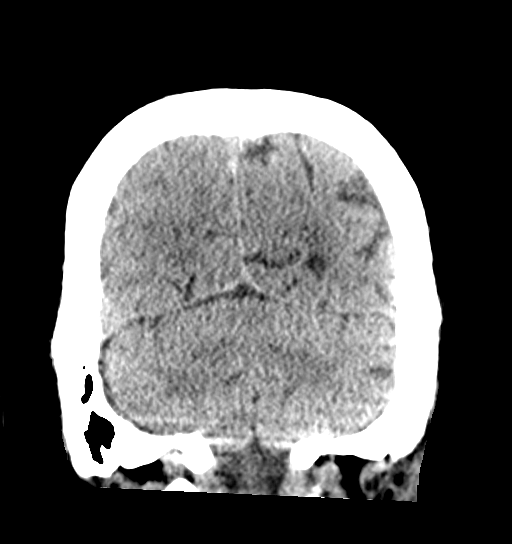

[Series 8: sagittal soft · sagittal · 0.33mm/px · 1 of 50 slices shown]
[im 25/50  brain]
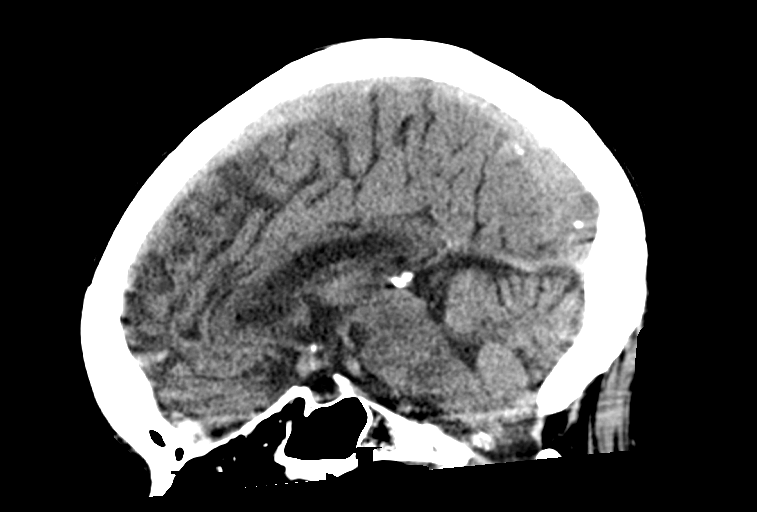

[Series 15: orthogonal axials · axial · 0.22mm/px · z∈[-164,-68]mm · 7 of 87 slices shown]
[im 11/87  brain]
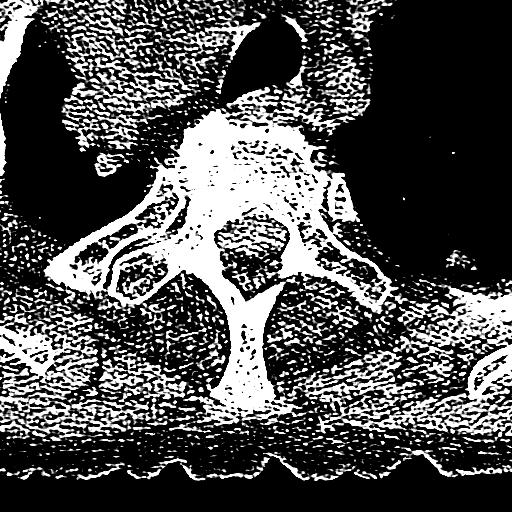
[im 22/87  brain]
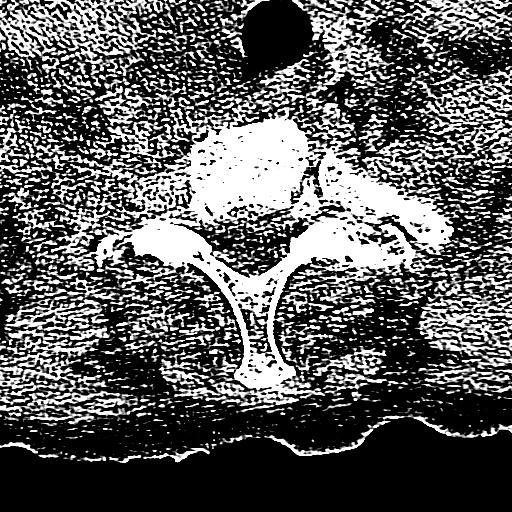
[im 33/87  brain]
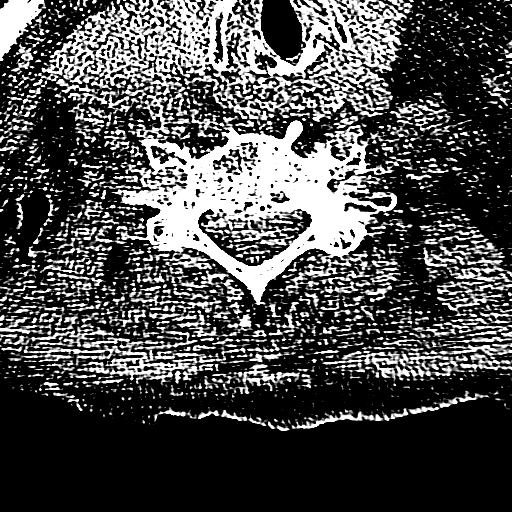
[im 44/87  brain]
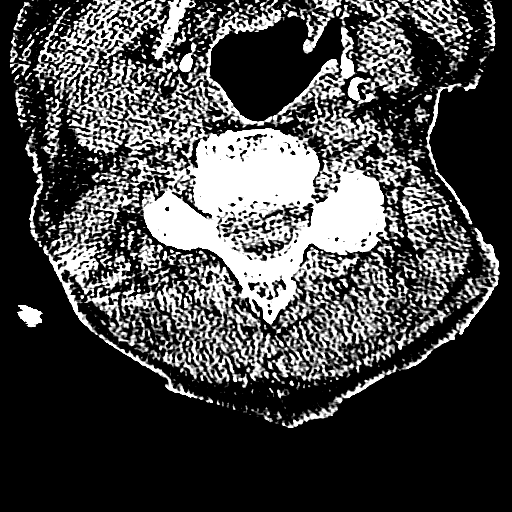
[im 54/87  brain]
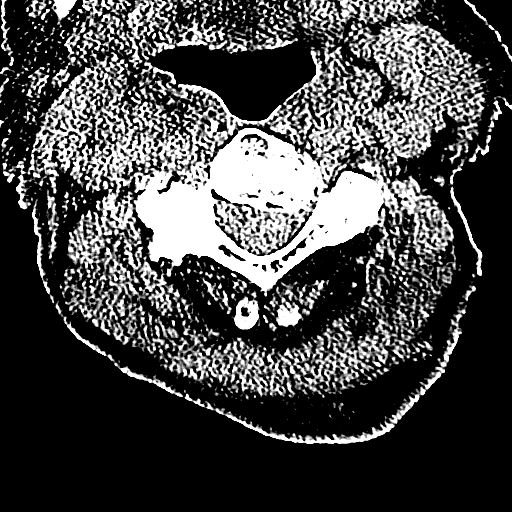
[im 65/87  brain]
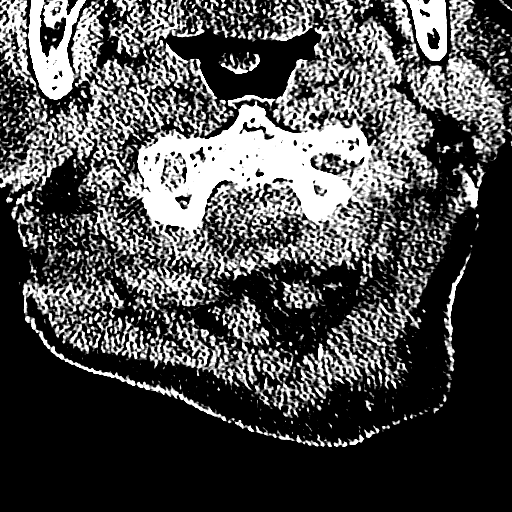
[im 76/87  brain]
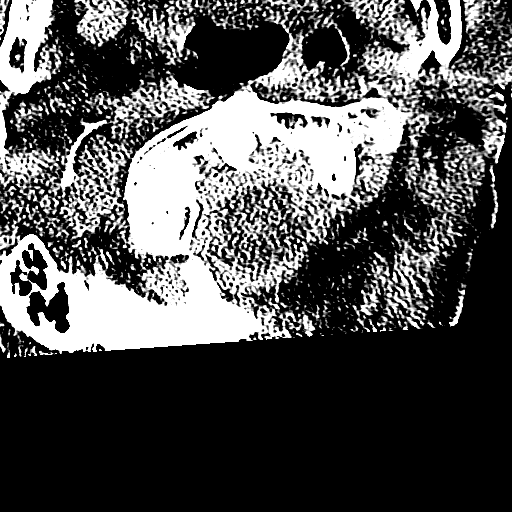

[17 of 47 positions shown; findings below may reference images not displayed]

FINDINGS: CT HEAD FINDINGS

Brain: There is no evidence of acute intracranial hemorrhage, mass
lesion, brain edema or extra-axial fluid collection. There is stable
mild atrophy with prominence of the ventricles and subarachnoid
spaces. Old infarct along the left sylvian fissure and chronic small
vessel ischemic changes in the periventricular white matter are
stable. There is no CT evidence of acute cortical infarction.

Vascular: Intracranial vascular calcifications. No hyperdense vessel
identified.

Skull: Negative for fracture or focal lesion.

Sinuses/Orbits: There is mild mucosal thickening in the ethmoid
sinuses. There are no air-fluid levels. The remainder of the
visualized paranasal sinuses, mastoid air cells and middle ears are
clear. There are postsurgical changes in the globes. No acute
orbital findings.

Other: None.

CT CERVICAL SPINE FINDINGS

Alignment: Images through the upper cervical spine were repeated due
to motion. Stable mild reversal of the usual cervical lordosis. No
focal angulation or listhesis.

Skull base and vertebrae: No evidence of acute cervical spine
fracture or traumatic subluxation.

Soft tissues and spinal canal: No prevertebral fluid or swelling. No
visible canal hematoma.

Disc levels: Generally stable multilevel spondylosis with disc space
narrowing, uncinate spurring and facet hypertrophy. Chronic
foraminal narrowing at multiple levels is grossly stable.

Upper chest: Stable 3 cm cystic lesion in the lower right neck near
the thoracic inlet.

Other: Bilateral carotid atherosclerosis.
IMPRESSION: 1. Stable head CT without acute intracranial findings. Stable
atrophy, chronic small vessel ischemic changes and old left MCA
stroke.
2. No evidence of acute cervical spine fracture, traumatic
subluxation or static signs of instability.
3. Multilevel cervical spondylosis.
